# Patient Record
Sex: Male | Born: 1935 | Race: White | Hispanic: No | Smoking: Former smoker
Health system: Southern US, Community
[De-identification: ages and names within clinical notes are randomized; demographics above are authoritative.]

## PROBLEM LIST (undated history)

## (undated) DIAGNOSIS — I10 Essential (primary) hypertension: Secondary | ICD-10-CM

## (undated) DIAGNOSIS — R011 Cardiac murmur, unspecified: Secondary | ICD-10-CM

## (undated) DIAGNOSIS — E119 Type 2 diabetes mellitus without complications: Secondary | ICD-10-CM

## (undated) DIAGNOSIS — N189 Chronic kidney disease, unspecified: Secondary | ICD-10-CM

## (undated) DIAGNOSIS — C801 Malignant (primary) neoplasm, unspecified: Secondary | ICD-10-CM

## (undated) DIAGNOSIS — D649 Anemia, unspecified: Secondary | ICD-10-CM

## (undated) HISTORY — PX: EYE SURGERY: SHX253

## (undated) HISTORY — PX: ROTATOR CUFF REPAIR: SHX139

## (undated) HISTORY — DX: Chronic kidney disease, unspecified: N18.9

## (undated) HISTORY — PX: COLON SURGERY: SHX602

## (undated) HISTORY — DX: Type 2 diabetes mellitus without complications: E11.9

## (undated) HISTORY — DX: Essential (primary) hypertension: I10

---

## 2004-05-18 ENCOUNTER — Ambulatory Visit: Payer: Self-pay

## 2004-05-31 ENCOUNTER — Ambulatory Visit: Payer: Self-pay | Admitting: General Surgery

## 2004-06-04 ENCOUNTER — Inpatient Hospital Stay: Payer: Self-pay | Admitting: General Surgery

## 2004-06-18 ENCOUNTER — Ambulatory Visit: Payer: Self-pay | Admitting: Oncology

## 2004-06-25 ENCOUNTER — Ambulatory Visit: Payer: Self-pay | Admitting: General Surgery

## 2004-07-11 ENCOUNTER — Ambulatory Visit: Payer: Self-pay | Admitting: Oncology

## 2004-08-11 ENCOUNTER — Ambulatory Visit: Payer: Self-pay | Admitting: Oncology

## 2004-09-10 ENCOUNTER — Ambulatory Visit: Payer: Self-pay | Admitting: Oncology

## 2004-10-11 ENCOUNTER — Ambulatory Visit: Payer: Self-pay | Admitting: Oncology

## 2004-11-10 ENCOUNTER — Ambulatory Visit: Payer: Self-pay | Admitting: Oncology

## 2004-12-11 ENCOUNTER — Ambulatory Visit: Payer: Self-pay | Admitting: Oncology

## 2005-01-11 ENCOUNTER — Ambulatory Visit: Payer: Self-pay | Admitting: Oncology

## 2005-02-10 ENCOUNTER — Ambulatory Visit: Payer: Self-pay | Admitting: Oncology

## 2005-03-13 ENCOUNTER — Ambulatory Visit: Payer: Self-pay | Admitting: Oncology

## 2005-04-12 ENCOUNTER — Ambulatory Visit: Payer: Self-pay | Admitting: Oncology

## 2005-05-13 ENCOUNTER — Ambulatory Visit: Payer: Self-pay | Admitting: Oncology

## 2005-06-13 ENCOUNTER — Ambulatory Visit: Payer: Self-pay | Admitting: Oncology

## 2005-07-11 ENCOUNTER — Ambulatory Visit: Payer: Self-pay | Admitting: Oncology

## 2005-08-11 ENCOUNTER — Ambulatory Visit: Payer: Self-pay | Admitting: Oncology

## 2005-08-27 ENCOUNTER — Ambulatory Visit: Payer: Self-pay | Admitting: Gastroenterology

## 2005-09-17 ENCOUNTER — Ambulatory Visit: Payer: Self-pay | Admitting: Oncology

## 2005-10-10 ENCOUNTER — Ambulatory Visit: Payer: Self-pay | Admitting: Gastroenterology

## 2005-10-21 ENCOUNTER — Ambulatory Visit: Payer: Self-pay | Admitting: General Surgery

## 2005-10-29 ENCOUNTER — Ambulatory Visit: Payer: Self-pay | Admitting: Oncology

## 2005-11-10 ENCOUNTER — Ambulatory Visit: Payer: Self-pay | Admitting: Oncology

## 2006-01-21 ENCOUNTER — Ambulatory Visit: Payer: Self-pay | Admitting: Oncology

## 2006-02-10 ENCOUNTER — Ambulatory Visit: Payer: Self-pay | Admitting: Oncology

## 2006-05-13 DIAGNOSIS — C801 Malignant (primary) neoplasm, unspecified: Secondary | ICD-10-CM

## 2006-05-13 HISTORY — DX: Malignant (primary) neoplasm, unspecified: C80.1

## 2006-05-21 ENCOUNTER — Ambulatory Visit: Payer: Self-pay | Admitting: Oncology

## 2006-06-13 ENCOUNTER — Ambulatory Visit: Payer: Self-pay | Admitting: Oncology

## 2006-11-11 ENCOUNTER — Ambulatory Visit: Payer: Self-pay | Admitting: Oncology

## 2006-11-19 ENCOUNTER — Ambulatory Visit: Payer: Self-pay | Admitting: Oncology

## 2006-12-12 ENCOUNTER — Ambulatory Visit: Payer: Self-pay | Admitting: Oncology

## 2006-12-12 ENCOUNTER — Ambulatory Visit: Payer: Self-pay | Admitting: Gastroenterology

## 2007-05-14 ENCOUNTER — Ambulatory Visit: Payer: Self-pay | Admitting: Oncology

## 2007-05-21 ENCOUNTER — Ambulatory Visit: Payer: Self-pay | Admitting: Oncology

## 2007-06-14 ENCOUNTER — Ambulatory Visit: Payer: Self-pay | Admitting: Oncology

## 2007-11-25 ENCOUNTER — Ambulatory Visit: Payer: Self-pay | Admitting: Oncology

## 2007-12-12 ENCOUNTER — Ambulatory Visit: Payer: Self-pay | Admitting: Oncology

## 2008-05-13 ENCOUNTER — Ambulatory Visit: Payer: Self-pay | Admitting: Oncology

## 2008-05-25 ENCOUNTER — Ambulatory Visit: Payer: Self-pay | Admitting: Oncology

## 2008-06-13 ENCOUNTER — Ambulatory Visit: Payer: Self-pay | Admitting: Oncology

## 2008-10-07 ENCOUNTER — Ambulatory Visit: Payer: Self-pay | Admitting: Gastroenterology

## 2008-11-10 ENCOUNTER — Ambulatory Visit: Payer: Self-pay | Admitting: Oncology

## 2008-11-21 ENCOUNTER — Ambulatory Visit: Payer: Self-pay | Admitting: Oncology

## 2008-12-11 ENCOUNTER — Ambulatory Visit: Payer: Self-pay | Admitting: Oncology

## 2009-05-13 ENCOUNTER — Ambulatory Visit: Payer: Self-pay | Admitting: Oncology

## 2009-06-07 ENCOUNTER — Ambulatory Visit: Payer: Self-pay | Admitting: Oncology

## 2009-06-13 ENCOUNTER — Ambulatory Visit: Payer: Self-pay | Admitting: Oncology

## 2010-06-07 ENCOUNTER — Ambulatory Visit: Payer: Self-pay | Admitting: Oncology

## 2010-06-13 ENCOUNTER — Ambulatory Visit: Payer: Self-pay | Admitting: Oncology

## 2011-01-15 ENCOUNTER — Ambulatory Visit: Payer: Self-pay | Admitting: Gastroenterology

## 2011-01-16 ENCOUNTER — Ambulatory Visit: Payer: Self-pay | Admitting: Gastroenterology

## 2011-06-19 ENCOUNTER — Ambulatory Visit: Payer: Self-pay | Admitting: Oncology

## 2011-06-19 LAB — CBC CANCER CENTER
Basophil %: 0.4 %
Eosinophil #: 0.2 x10 3/mm (ref 0.0–0.7)
Eosinophil %: 2.2 %
HCT: 38.6 % — ABNORMAL LOW (ref 40.0–52.0)
Lymphocyte #: 1.4 x10 3/mm (ref 1.0–3.6)
MCH: 32.8 pg (ref 26.0–34.0)
MCHC: 34.7 g/dL (ref 32.0–36.0)
Monocyte #: 0.6 x10 3/mm (ref 0.0–0.7)
Monocyte %: 8 %
Neutrophil #: 5.2 x10 3/mm (ref 1.4–6.5)
Neutrophil %: 70.1 %
Platelet: 181 x10 3/mm (ref 150–440)
RBC: 4.09 10*6/uL — ABNORMAL LOW (ref 4.40–5.90)
RDW: 13.4 % (ref 11.5–14.5)

## 2011-06-19 LAB — COMPREHENSIVE METABOLIC PANEL
Albumin: 3.8 g/dL (ref 3.4–5.0)
Anion Gap: 7 (ref 7–16)
Bilirubin,Total: 0.6 mg/dL (ref 0.2–1.0)
Calcium, Total: 8.9 mg/dL (ref 8.5–10.1)
EGFR (African American): 33 — ABNORMAL LOW
EGFR (Non-African Amer.): 28 — ABNORMAL LOW
Glucose: 193 mg/dL — ABNORMAL HIGH (ref 65–99)
Potassium: 4.1 mmol/L (ref 3.5–5.1)
SGOT(AST): 24 U/L (ref 15–37)

## 2011-07-12 ENCOUNTER — Ambulatory Visit: Payer: Self-pay | Admitting: Oncology

## 2013-12-01 DIAGNOSIS — M5030 Other cervical disc degeneration, unspecified cervical region: Secondary | ICD-10-CM | POA: Insufficient documentation

## 2013-12-01 DIAGNOSIS — M47812 Spondylosis without myelopathy or radiculopathy, cervical region: Secondary | ICD-10-CM | POA: Insufficient documentation

## 2014-03-18 DIAGNOSIS — Z85038 Personal history of other malignant neoplasm of large intestine: Secondary | ICD-10-CM | POA: Insufficient documentation

## 2014-04-05 ENCOUNTER — Ambulatory Visit: Payer: Self-pay | Admitting: Gastroenterology

## 2015-05-19 DIAGNOSIS — J209 Acute bronchitis, unspecified: Secondary | ICD-10-CM | POA: Diagnosis not present

## 2015-06-02 DIAGNOSIS — I10 Essential (primary) hypertension: Secondary | ICD-10-CM | POA: Diagnosis not present

## 2015-06-02 DIAGNOSIS — E7800 Pure hypercholesterolemia, unspecified: Secondary | ICD-10-CM | POA: Diagnosis not present

## 2015-06-02 DIAGNOSIS — Z79899 Other long term (current) drug therapy: Secondary | ICD-10-CM | POA: Diagnosis not present

## 2015-06-02 DIAGNOSIS — N184 Chronic kidney disease, stage 4 (severe): Secondary | ICD-10-CM | POA: Diagnosis not present

## 2015-06-02 DIAGNOSIS — E118 Type 2 diabetes mellitus with unspecified complications: Secondary | ICD-10-CM | POA: Diagnosis not present

## 2015-06-02 DIAGNOSIS — N529 Male erectile dysfunction, unspecified: Secondary | ICD-10-CM | POA: Diagnosis not present

## 2015-06-02 DIAGNOSIS — Z125 Encounter for screening for malignant neoplasm of prostate: Secondary | ICD-10-CM | POA: Diagnosis not present

## 2015-06-06 DIAGNOSIS — N184 Chronic kidney disease, stage 4 (severe): Secondary | ICD-10-CM | POA: Diagnosis not present

## 2015-06-06 DIAGNOSIS — Z794 Long term (current) use of insulin: Secondary | ICD-10-CM | POA: Diagnosis not present

## 2015-06-06 DIAGNOSIS — E1165 Type 2 diabetes mellitus with hyperglycemia: Secondary | ICD-10-CM | POA: Diagnosis not present

## 2015-06-06 DIAGNOSIS — E1122 Type 2 diabetes mellitus with diabetic chronic kidney disease: Secondary | ICD-10-CM | POA: Diagnosis not present

## 2015-06-27 DIAGNOSIS — E119 Type 2 diabetes mellitus without complications: Secondary | ICD-10-CM | POA: Diagnosis not present

## 2015-06-27 DIAGNOSIS — H35343 Macular cyst, hole, or pseudohole, bilateral: Secondary | ICD-10-CM | POA: Diagnosis not present

## 2015-07-14 DIAGNOSIS — Z79899 Other long term (current) drug therapy: Secondary | ICD-10-CM | POA: Diagnosis not present

## 2015-07-14 DIAGNOSIS — Z125 Encounter for screening for malignant neoplasm of prostate: Secondary | ICD-10-CM | POA: Diagnosis not present

## 2015-07-14 DIAGNOSIS — E7800 Pure hypercholesterolemia, unspecified: Secondary | ICD-10-CM | POA: Diagnosis not present

## 2015-07-14 DIAGNOSIS — E118 Type 2 diabetes mellitus with unspecified complications: Secondary | ICD-10-CM | POA: Diagnosis not present

## 2015-07-14 DIAGNOSIS — I10 Essential (primary) hypertension: Secondary | ICD-10-CM | POA: Diagnosis not present

## 2015-08-01 DIAGNOSIS — I10 Essential (primary) hypertension: Secondary | ICD-10-CM | POA: Diagnosis not present

## 2015-08-01 DIAGNOSIS — N184 Chronic kidney disease, stage 4 (severe): Secondary | ICD-10-CM | POA: Diagnosis not present

## 2015-08-01 DIAGNOSIS — Z794 Long term (current) use of insulin: Secondary | ICD-10-CM | POA: Diagnosis not present

## 2015-08-01 DIAGNOSIS — E118 Type 2 diabetes mellitus with unspecified complications: Secondary | ICD-10-CM | POA: Diagnosis not present

## 2015-08-01 DIAGNOSIS — E1122 Type 2 diabetes mellitus with diabetic chronic kidney disease: Secondary | ICD-10-CM | POA: Diagnosis not present

## 2015-08-31 ENCOUNTER — Other Ambulatory Visit: Payer: Self-pay | Admitting: Internal Medicine

## 2015-08-31 DIAGNOSIS — I10 Essential (primary) hypertension: Secondary | ICD-10-CM | POA: Diagnosis not present

## 2015-08-31 DIAGNOSIS — Z8739 Personal history of other diseases of the musculoskeletal system and connective tissue: Secondary | ICD-10-CM | POA: Diagnosis not present

## 2015-08-31 DIAGNOSIS — Z79899 Other long term (current) drug therapy: Secondary | ICD-10-CM | POA: Diagnosis not present

## 2015-08-31 DIAGNOSIS — E118 Type 2 diabetes mellitus with unspecified complications: Secondary | ICD-10-CM | POA: Diagnosis not present

## 2015-08-31 DIAGNOSIS — E7800 Pure hypercholesterolemia, unspecified: Secondary | ICD-10-CM | POA: Diagnosis not present

## 2015-08-31 DIAGNOSIS — N184 Chronic kidney disease, stage 4 (severe): Secondary | ICD-10-CM

## 2015-08-31 DIAGNOSIS — N185 Chronic kidney disease, stage 5: Secondary | ICD-10-CM | POA: Insufficient documentation

## 2015-09-06 DIAGNOSIS — E7800 Pure hypercholesterolemia, unspecified: Secondary | ICD-10-CM | POA: Diagnosis not present

## 2015-09-06 DIAGNOSIS — I10 Essential (primary) hypertension: Secondary | ICD-10-CM | POA: Diagnosis not present

## 2015-09-06 DIAGNOSIS — E118 Type 2 diabetes mellitus with unspecified complications: Secondary | ICD-10-CM | POA: Diagnosis not present

## 2015-09-06 DIAGNOSIS — N184 Chronic kidney disease, stage 4 (severe): Secondary | ICD-10-CM | POA: Diagnosis not present

## 2015-09-07 ENCOUNTER — Ambulatory Visit
Admission: RE | Admit: 2015-09-07 | Discharge: 2015-09-07 | Disposition: A | Discharge status: Home / Self Care | Payer: PPO | Source: Ambulatory Visit | Attending: Internal Medicine | Admitting: Internal Medicine

## 2015-09-07 DIAGNOSIS — N184 Chronic kidney disease, stage 4 (severe): Secondary | ICD-10-CM | POA: Insufficient documentation

## 2015-09-11 DIAGNOSIS — E1122 Type 2 diabetes mellitus with diabetic chronic kidney disease: Secondary | ICD-10-CM | POA: Diagnosis not present

## 2015-09-11 DIAGNOSIS — R809 Proteinuria, unspecified: Secondary | ICD-10-CM | POA: Diagnosis not present

## 2015-09-11 DIAGNOSIS — I701 Atherosclerosis of renal artery: Secondary | ICD-10-CM | POA: Diagnosis not present

## 2015-09-11 DIAGNOSIS — N183 Chronic kidney disease, stage 3 (moderate): Secondary | ICD-10-CM | POA: Diagnosis not present

## 2015-09-11 DIAGNOSIS — I129 Hypertensive chronic kidney disease with stage 1 through stage 4 chronic kidney disease, or unspecified chronic kidney disease: Secondary | ICD-10-CM | POA: Diagnosis not present

## 2015-09-15 DIAGNOSIS — I701 Atherosclerosis of renal artery: Secondary | ICD-10-CM | POA: Diagnosis not present

## 2015-10-16 DIAGNOSIS — N179 Acute kidney failure, unspecified: Secondary | ICD-10-CM | POA: Diagnosis not present

## 2015-10-16 DIAGNOSIS — I701 Atherosclerosis of renal artery: Secondary | ICD-10-CM | POA: Diagnosis not present

## 2015-10-16 DIAGNOSIS — I129 Hypertensive chronic kidney disease with stage 1 through stage 4 chronic kidney disease, or unspecified chronic kidney disease: Secondary | ICD-10-CM | POA: Diagnosis not present

## 2015-10-16 DIAGNOSIS — E1122 Type 2 diabetes mellitus with diabetic chronic kidney disease: Secondary | ICD-10-CM | POA: Diagnosis not present

## 2015-10-16 DIAGNOSIS — N184 Chronic kidney disease, stage 4 (severe): Secondary | ICD-10-CM | POA: Diagnosis not present

## 2015-10-26 DIAGNOSIS — I129 Hypertensive chronic kidney disease with stage 1 through stage 4 chronic kidney disease, or unspecified chronic kidney disease: Secondary | ICD-10-CM | POA: Diagnosis not present

## 2015-10-26 DIAGNOSIS — N184 Chronic kidney disease, stage 4 (severe): Secondary | ICD-10-CM | POA: Diagnosis not present

## 2015-10-26 DIAGNOSIS — I701 Atherosclerosis of renal artery: Secondary | ICD-10-CM | POA: Diagnosis not present

## 2015-10-26 DIAGNOSIS — E119 Type 2 diabetes mellitus without complications: Secondary | ICD-10-CM | POA: Diagnosis not present

## 2015-11-06 DIAGNOSIS — E1122 Type 2 diabetes mellitus with diabetic chronic kidney disease: Secondary | ICD-10-CM | POA: Diagnosis not present

## 2015-11-06 DIAGNOSIS — Z794 Long term (current) use of insulin: Secondary | ICD-10-CM | POA: Diagnosis not present

## 2015-11-06 DIAGNOSIS — N184 Chronic kidney disease, stage 4 (severe): Secondary | ICD-10-CM | POA: Diagnosis not present

## 2015-11-23 DIAGNOSIS — E782 Mixed hyperlipidemia: Secondary | ICD-10-CM | POA: Diagnosis not present

## 2015-11-23 DIAGNOSIS — E118 Type 2 diabetes mellitus with unspecified complications: Secondary | ICD-10-CM | POA: Diagnosis not present

## 2015-11-23 DIAGNOSIS — Z79899 Other long term (current) drug therapy: Secondary | ICD-10-CM | POA: Diagnosis not present

## 2015-11-23 DIAGNOSIS — I10 Essential (primary) hypertension: Secondary | ICD-10-CM | POA: Diagnosis not present

## 2015-11-23 DIAGNOSIS — Z125 Encounter for screening for malignant neoplasm of prostate: Secondary | ICD-10-CM | POA: Diagnosis not present

## 2015-11-30 DIAGNOSIS — E7800 Pure hypercholesterolemia, unspecified: Secondary | ICD-10-CM | POA: Diagnosis not present

## 2015-11-30 DIAGNOSIS — I10 Essential (primary) hypertension: Secondary | ICD-10-CM | POA: Diagnosis not present

## 2015-11-30 DIAGNOSIS — N184 Chronic kidney disease, stage 4 (severe): Secondary | ICD-10-CM | POA: Diagnosis not present

## 2015-11-30 DIAGNOSIS — E118 Type 2 diabetes mellitus with unspecified complications: Secondary | ICD-10-CM | POA: Diagnosis not present

## 2015-12-05 DIAGNOSIS — E1122 Type 2 diabetes mellitus with diabetic chronic kidney disease: Secondary | ICD-10-CM | POA: Diagnosis not present

## 2015-12-05 DIAGNOSIS — N184 Chronic kidney disease, stage 4 (severe): Secondary | ICD-10-CM | POA: Diagnosis not present

## 2015-12-05 DIAGNOSIS — I701 Atherosclerosis of renal artery: Secondary | ICD-10-CM | POA: Diagnosis not present

## 2015-12-05 DIAGNOSIS — R809 Proteinuria, unspecified: Secondary | ICD-10-CM | POA: Diagnosis not present

## 2015-12-05 DIAGNOSIS — I129 Hypertensive chronic kidney disease with stage 1 through stage 4 chronic kidney disease, or unspecified chronic kidney disease: Secondary | ICD-10-CM | POA: Diagnosis not present

## 2016-02-05 DIAGNOSIS — R350 Frequency of micturition: Secondary | ICD-10-CM | POA: Diagnosis not present

## 2016-02-05 DIAGNOSIS — R590 Localized enlarged lymph nodes: Secondary | ICD-10-CM | POA: Diagnosis not present

## 2016-02-22 DIAGNOSIS — M545 Low back pain: Secondary | ICD-10-CM | POA: Diagnosis not present

## 2016-02-22 DIAGNOSIS — N184 Chronic kidney disease, stage 4 (severe): Secondary | ICD-10-CM | POA: Diagnosis not present

## 2016-03-11 DIAGNOSIS — E7800 Pure hypercholesterolemia, unspecified: Secondary | ICD-10-CM | POA: Diagnosis not present

## 2016-03-11 DIAGNOSIS — E118 Type 2 diabetes mellitus with unspecified complications: Secondary | ICD-10-CM | POA: Diagnosis not present

## 2016-03-11 DIAGNOSIS — E1122 Type 2 diabetes mellitus with diabetic chronic kidney disease: Secondary | ICD-10-CM | POA: Diagnosis not present

## 2016-03-11 DIAGNOSIS — I10 Essential (primary) hypertension: Secondary | ICD-10-CM | POA: Diagnosis not present

## 2016-03-11 DIAGNOSIS — N184 Chronic kidney disease, stage 4 (severe): Secondary | ICD-10-CM | POA: Diagnosis not present

## 2016-03-11 DIAGNOSIS — Z794 Long term (current) use of insulin: Secondary | ICD-10-CM | POA: Diagnosis not present

## 2016-03-12 DIAGNOSIS — H35372 Puckering of macula, left eye: Secondary | ICD-10-CM | POA: Diagnosis not present

## 2016-03-13 DIAGNOSIS — I10 Essential (primary) hypertension: Secondary | ICD-10-CM | POA: Diagnosis not present

## 2016-03-13 DIAGNOSIS — N184 Chronic kidney disease, stage 4 (severe): Secondary | ICD-10-CM | POA: Diagnosis not present

## 2016-03-13 DIAGNOSIS — E7800 Pure hypercholesterolemia, unspecified: Secondary | ICD-10-CM | POA: Diagnosis not present

## 2016-03-13 DIAGNOSIS — I129 Hypertensive chronic kidney disease with stage 1 through stage 4 chronic kidney disease, or unspecified chronic kidney disease: Secondary | ICD-10-CM | POA: Diagnosis not present

## 2016-03-18 DIAGNOSIS — I701 Atherosclerosis of renal artery: Secondary | ICD-10-CM | POA: Diagnosis not present

## 2016-03-18 DIAGNOSIS — R809 Proteinuria, unspecified: Secondary | ICD-10-CM | POA: Diagnosis not present

## 2016-03-18 DIAGNOSIS — N184 Chronic kidney disease, stage 4 (severe): Secondary | ICD-10-CM | POA: Diagnosis not present

## 2016-03-18 DIAGNOSIS — I129 Hypertensive chronic kidney disease with stage 1 through stage 4 chronic kidney disease, or unspecified chronic kidney disease: Secondary | ICD-10-CM | POA: Diagnosis not present

## 2016-03-18 DIAGNOSIS — E1122 Type 2 diabetes mellitus with diabetic chronic kidney disease: Secondary | ICD-10-CM | POA: Diagnosis not present

## 2016-04-26 ENCOUNTER — Other Ambulatory Visit (INDEPENDENT_AMBULATORY_CARE_PROVIDER_SITE_OTHER): Payer: Self-pay | Admitting: Vascular Surgery

## 2016-04-26 DIAGNOSIS — M7711 Lateral epicondylitis, right elbow: Secondary | ICD-10-CM | POA: Diagnosis not present

## 2016-04-26 DIAGNOSIS — N189 Chronic kidney disease, unspecified: Secondary | ICD-10-CM

## 2016-05-02 ENCOUNTER — Ambulatory Visit (INDEPENDENT_AMBULATORY_CARE_PROVIDER_SITE_OTHER): Payer: PPO | Admitting: Vascular Surgery

## 2016-05-02 ENCOUNTER — Encounter (INDEPENDENT_AMBULATORY_CARE_PROVIDER_SITE_OTHER): Payer: Self-pay | Admitting: Vascular Surgery

## 2016-05-02 ENCOUNTER — Ambulatory Visit (INDEPENDENT_AMBULATORY_CARE_PROVIDER_SITE_OTHER): Payer: PPO

## 2016-05-02 DIAGNOSIS — E119 Type 2 diabetes mellitus without complications: Secondary | ICD-10-CM

## 2016-05-02 DIAGNOSIS — N189 Chronic kidney disease, unspecified: Secondary | ICD-10-CM

## 2016-05-02 DIAGNOSIS — I10 Essential (primary) hypertension: Secondary | ICD-10-CM | POA: Diagnosis not present

## 2016-05-02 DIAGNOSIS — E118 Type 2 diabetes mellitus with unspecified complications: Secondary | ICD-10-CM | POA: Insufficient documentation

## 2016-05-02 DIAGNOSIS — E1122 Type 2 diabetes mellitus with diabetic chronic kidney disease: Secondary | ICD-10-CM | POA: Diagnosis not present

## 2016-05-02 DIAGNOSIS — E785 Hyperlipidemia, unspecified: Secondary | ICD-10-CM | POA: Insufficient documentation

## 2016-05-02 DIAGNOSIS — Z794 Long term (current) use of insulin: Secondary | ICD-10-CM | POA: Diagnosis not present

## 2016-05-02 DIAGNOSIS — I701 Atherosclerosis of renal artery: Secondary | ICD-10-CM | POA: Insufficient documentation

## 2016-05-02 DIAGNOSIS — N184 Chronic kidney disease, stage 4 (severe): Secondary | ICD-10-CM

## 2016-05-02 DIAGNOSIS — E782 Mixed hyperlipidemia: Secondary | ICD-10-CM

## 2016-05-02 NOTE — Progress Notes (Signed)
MRN : [id number]  Dylan Sanders is a 82 y.o. (04-20-1932) male who presents with chief complaint of  Chief Complaint  Patient presents with  . Follow-up  .  History of Present Illness: The patient is seen for follow up of renal artery stenosis. His hypertension which has been under control. The patient has a long history of hypertension.  The patient does have family history of hypertension.   There is no prior documented abdominal bruit. The patient occasionally has flushing symptoms but denies palpitations. No episodes of syncope.There is no history of headache. There is no history of flash pulmonary edema.  The patient denies a history of renal disease.  The patient denies amaurosis fugax or recent TIA symptoms. There are no recent neurological changes noted. The patient denies claudication symptoms or rest pain symptoms. The patient denies history of DVT, PE or superficial thrombophlebitis. The patient denies recent episodes of angina or shortness of breath.   Duplex ultrasound of the renal arteries demonstrates stable noncritical RAS   Current Meds  Medication Sig  . allopurinol (ZYLOPRIM) 100 MG tablet Take by mouth.  Marland Kitchen aspirin EC 81 MG tablet Take by mouth.  . Blood Glucose Monitoring Suppl (ONE TOUCH ULTRA 2) w/Device KIT USE TO CHECK SUGARS DAILY  . furosemide (LASIX) 20 MG tablet   . glipiZIDE (GLUCOTROL XL) 5 MG 24 hr tablet   . LANTUS SOLOSTAR 100 UNIT/ML Solostar Pen   . losartan (COZAAR) 100 MG tablet Take by mouth.  . simvastatin (ZOCOR) 20 MG tablet Take by mouth.  Marland Kitchen ULTICARE SHORT PEN NEEDLES 31G X 8 MM MISC   . verapamil (CALAN-SR) 240 MG CR tablet Take 1 tablet by mouth once daily    Past Medical History:  Diagnosis Date  . Chronic kidney disease   . Diabetes mellitus without complication ([place])   . Hypertension     Past Surgical History:  Procedure Laterality Date  . COLON SURGERY    . ROTATOR CUFF REPAIR Right     Social History Social  History  Substance Use Topics  . Smoking status: Former Research scientist (life sciences)  . Smokeless tobacco: Never Used  . Alcohol use Yes     Comment: ocassionally    Family History Family History  Problem Relation Age of Onset  . Cancer Father   No family history of bleeding/clotting disorders, porphyria or autoimmune disease   No Known Allergies   REVIEW OF SYSTEMS (Negative unless checked)  Constitutional: [] Weight loss  [] Fever  [] Chills Cardiac: [] Chest pain   [] Chest pressure   [] Palpitations   [] Shortness of breath when laying flat   [] Shortness of breath with exertion. Vascular:  [] Pain in legs with walking   [] Pain in legs at rest  [] History of DVT   [] Phlebitis   [] Swelling in legs   [] Varicose veins   [] Non-healing ulcers Pulmonary:   [] Uses home oxygen   [] Productive cough   [] Hemoptysis   [] Wheeze  [] COPD   [] Asthma Neurologic:  [] Dizziness   [] Seizures   [] History of stroke   [] History of TIA  [] Aphasia   [] Vissual changes   [] Weakness or numbness in arm   [] Weakness or numbness in leg Musculoskeletal:   [] Joint swelling   [] Joint pain   [] Low back pain Hematologic:  [] Easy bruising  [] Easy bleeding   [] Hypercoagulable state   [] Anemic Gastrointestinal:  [] Diarrhea   [] Vomiting  [] Gastroesophageal reflux/heartburn   [] Difficulty swallowing. Genitourinary:  [] Chronic kidney disease   [] Difficult urination  [] Frequent urination   []   Blood in urine Skin:  [] Rashes   [] Ulcers  Psychological:  [] History of anxiety   []  History of major depression.  Physical Examination  Vitals:   05/02/16 1007  BP: (!) 185/73  Pulse: 63  Resp: 17  Weight: 135 lb (61.2 kg)  Height: 5' 8"  (1.727 m)   Body mass index is 20.53 kg/m. Gen: WD/WN, NAD Head: [place]/AT, No temporalis wasting.  Ear/Nose/Throat: Hearing grossly intact, nares w/o erythema or drainage, poor dentition Eyes: PER, EOMI, sclera nonicteric.  Neck: Supple, no masses.  No bruit or JVD.  Pulmonary:  Good air movement, clear to auscultation  bilaterally, no use of accessory muscles.  Cardiac: RRR, normal S1, S2, no Murmurs. Vascular:  Vessel Right Left  Radial Palpable Palpable  Ulnar Palpable Palpable  Brachial Palpable Palpable  Carotid Palpable Palpable  Femoral Palpable Palpable  Popliteal Palpable Palpable  PT Palpable Palpable  DP Palpable Palpable   Gastrointestinal: soft, non-distended. No guarding/no peritoneal signs.  Musculoskeletal: M/S 5/5 throughout.  No deformity or atrophy.  Neurologic: CN 2-12 intact. Pain and light touch intact in extremities.  Symmetrical.  Speech is fluent. Motor exam as listed above. Psychiatric: Judgment intact, Mood & affect appropriate for pt's clinical situation. Dermatologic: No rashes or ulcers noted.  No changes consistent with cellulitis. Lymph : No Cervical lymphadenopathy, no lichenification or skin changes of chronic lymphedema.  CBC Lab Results  Component Value Date   WBC 7.4 06/19/2011   HGB 13.4 06/19/2011   HCT 38.6 (L) 06/19/2011   MCV 94 06/19/2011   PLT 181 06/19/2011    BMET    Component Value Date/Time   NA 141 06/19/2011 1343   K 4.1 06/19/2011 1343   CL 102 06/19/2011 1343   CO2 32 06/19/2011 1343   GLUCOSE 193 (H) 06/19/2011 1343   BUN 26 (H) 06/19/2011 1343   CREATININE 2.43 (H) 06/19/2011 1343   CALCIUM 8.9 06/19/2011 1343   GFRNONAA 28 (L) 06/19/2011 1343   GFRAA 33 (L) 06/19/2011 1343   CrCl cannot be calculated (Patient's most recent lab result is older than the maximum 21 days allowed.).  COAG No results found for: INR, PROTIME  Radiology No results found.  Assessment/Plan 1. Renal artery stenosis (HCC) Given patient's arterial disease optimal control of the patient's hypertension is important. BP is acceptable today  The patient's vital signs and noninvasive studies support the renal artery stenosis is not significantly increased when compared to the previous study.  No invasive studies or intervention is indicated at this  time.  The patient will continue the current antihypertensive medications, no changes at this time.  The primary medical service will continue aggressive antihypertensive therapy as per the AHA guidelines   - VAS US RENAL ARTERY DUPLEX; Future  2.Chronic renal impairment, stage 4 (severe) ([place]) See #1  3. Essential hypertension Continue antihypertensive medications as already ordered, these medications have been reviewed and there are no changes at this time.  4. Type 2 diabetes mellitus with stage 4 chronic kidney disease, with long-term current use of insulin (HCC) Continue hypoglycemic medications as already ordered, these medications have been reviewed and there are no changes at this time.  Hgb A1C to be monitored as already arranged by primary service  5. Mixed hyperlipidemia Continue statin as ordered and reviewed, no changes at this time   Hortencia Pilar, MD  05/02/2016 10:29 AM

## 2016-06-03 DIAGNOSIS — S46002A Unspecified injury of muscle(s) and tendon(s) of the rotator cuff of left shoulder, initial encounter: Secondary | ICD-10-CM | POA: Diagnosis not present

## 2016-06-03 DIAGNOSIS — M25312 Other instability, left shoulder: Secondary | ICD-10-CM | POA: Diagnosis not present

## 2016-06-03 DIAGNOSIS — M25512 Pain in left shoulder: Secondary | ICD-10-CM | POA: Diagnosis not present

## 2016-06-04 ENCOUNTER — Other Ambulatory Visit: Payer: Self-pay | Admitting: Orthopedic Surgery

## 2016-06-04 DIAGNOSIS — M25512 Pain in left shoulder: Secondary | ICD-10-CM

## 2016-06-12 DIAGNOSIS — I10 Essential (primary) hypertension: Secondary | ICD-10-CM | POA: Diagnosis not present

## 2016-06-12 DIAGNOSIS — Z79899 Other long term (current) drug therapy: Secondary | ICD-10-CM | POA: Diagnosis not present

## 2016-06-12 DIAGNOSIS — Z0000 Encounter for general adult medical examination without abnormal findings: Secondary | ICD-10-CM | POA: Diagnosis not present

## 2016-06-12 DIAGNOSIS — E7800 Pure hypercholesterolemia, unspecified: Secondary | ICD-10-CM | POA: Diagnosis not present

## 2016-06-12 DIAGNOSIS — N184 Chronic kidney disease, stage 4 (severe): Secondary | ICD-10-CM | POA: Diagnosis not present

## 2016-06-12 DIAGNOSIS — E118 Type 2 diabetes mellitus with unspecified complications: Secondary | ICD-10-CM | POA: Diagnosis not present

## 2016-06-12 DIAGNOSIS — E215 Disorder of parathyroid gland, unspecified: Secondary | ICD-10-CM | POA: Diagnosis not present

## 2016-06-13 ENCOUNTER — Ambulatory Visit
Admission: RE | Admit: 2016-06-13 | Discharge: 2016-06-13 | Disposition: A | Discharge status: Home / Self Care | Payer: PPO | Source: Ambulatory Visit | Attending: Orthopedic Surgery | Admitting: Orthopedic Surgery

## 2016-06-26 ENCOUNTER — Ambulatory Visit
Admission: RE | Admit: 2016-06-26 | Discharge: 2016-06-26 | Disposition: A | Discharge status: Home / Self Care | Payer: PPO | Source: Ambulatory Visit | Attending: Orthopedic Surgery | Admitting: Orthopedic Surgery

## 2016-06-26 DIAGNOSIS — S46812A Strain of other muscles, fascia and tendons at shoulder and upper arm level, left arm, initial encounter: Secondary | ICD-10-CM | POA: Diagnosis not present

## 2016-06-26 DIAGNOSIS — M25512 Pain in left shoulder: Secondary | ICD-10-CM | POA: Diagnosis not present

## 2016-06-26 DIAGNOSIS — X58XXXA Exposure to other specified factors, initial encounter: Secondary | ICD-10-CM | POA: Diagnosis not present

## 2016-06-26 DIAGNOSIS — M12812 Other specific arthropathies, not elsewhere classified, left shoulder: Secondary | ICD-10-CM | POA: Diagnosis not present

## 2016-06-26 DIAGNOSIS — M19012 Primary osteoarthritis, left shoulder: Secondary | ICD-10-CM | POA: Diagnosis not present

## 2016-07-03 DIAGNOSIS — Z794 Long term (current) use of insulin: Secondary | ICD-10-CM | POA: Diagnosis not present

## 2016-07-03 DIAGNOSIS — I10 Essential (primary) hypertension: Secondary | ICD-10-CM | POA: Diagnosis not present

## 2016-07-03 DIAGNOSIS — N184 Chronic kidney disease, stage 4 (severe): Secondary | ICD-10-CM | POA: Diagnosis not present

## 2016-07-03 DIAGNOSIS — Z79899 Other long term (current) drug therapy: Secondary | ICD-10-CM | POA: Diagnosis not present

## 2016-07-03 DIAGNOSIS — E7800 Pure hypercholesterolemia, unspecified: Secondary | ICD-10-CM | POA: Diagnosis not present

## 2016-07-03 DIAGNOSIS — E215 Disorder of parathyroid gland, unspecified: Secondary | ICD-10-CM | POA: Diagnosis not present

## 2016-07-03 DIAGNOSIS — E1122 Type 2 diabetes mellitus with diabetic chronic kidney disease: Secondary | ICD-10-CM | POA: Diagnosis not present

## 2016-07-05 DIAGNOSIS — M12812 Other specific arthropathies, not elsewhere classified, left shoulder: Secondary | ICD-10-CM | POA: Diagnosis not present

## 2016-07-05 DIAGNOSIS — M19012 Primary osteoarthritis, left shoulder: Secondary | ICD-10-CM | POA: Diagnosis not present

## 2016-07-05 DIAGNOSIS — M75122 Complete rotator cuff tear or rupture of left shoulder, not specified as traumatic: Secondary | ICD-10-CM | POA: Diagnosis not present

## 2016-07-10 DIAGNOSIS — Z794 Long term (current) use of insulin: Secondary | ICD-10-CM | POA: Diagnosis not present

## 2016-07-10 DIAGNOSIS — N184 Chronic kidney disease, stage 4 (severe): Secondary | ICD-10-CM | POA: Diagnosis not present

## 2016-07-10 DIAGNOSIS — E1122 Type 2 diabetes mellitus with diabetic chronic kidney disease: Secondary | ICD-10-CM | POA: Diagnosis not present

## 2016-07-10 DIAGNOSIS — I10 Essential (primary) hypertension: Secondary | ICD-10-CM | POA: Diagnosis not present

## 2016-07-31 DIAGNOSIS — E875 Hyperkalemia: Secondary | ICD-10-CM | POA: Diagnosis not present

## 2016-07-31 DIAGNOSIS — M109 Gout, unspecified: Secondary | ICD-10-CM | POA: Diagnosis not present

## 2016-07-31 DIAGNOSIS — E1122 Type 2 diabetes mellitus with diabetic chronic kidney disease: Secondary | ICD-10-CM | POA: Diagnosis not present

## 2016-07-31 DIAGNOSIS — N184 Chronic kidney disease, stage 4 (severe): Secondary | ICD-10-CM | POA: Diagnosis not present

## 2016-07-31 DIAGNOSIS — I401 Isolated myocarditis: Secondary | ICD-10-CM | POA: Diagnosis not present

## 2016-07-31 DIAGNOSIS — R809 Proteinuria, unspecified: Secondary | ICD-10-CM | POA: Diagnosis not present

## 2016-07-31 DIAGNOSIS — I129 Hypertensive chronic kidney disease with stage 1 through stage 4 chronic kidney disease, or unspecified chronic kidney disease: Secondary | ICD-10-CM | POA: Diagnosis not present

## 2016-07-31 DIAGNOSIS — R319 Hematuria, unspecified: Secondary | ICD-10-CM | POA: Diagnosis not present

## 2016-08-07 DIAGNOSIS — N2581 Secondary hyperparathyroidism of renal origin: Secondary | ICD-10-CM | POA: Diagnosis not present

## 2016-08-07 DIAGNOSIS — N184 Chronic kidney disease, stage 4 (severe): Secondary | ICD-10-CM | POA: Diagnosis not present

## 2016-08-07 DIAGNOSIS — I129 Hypertensive chronic kidney disease with stage 1 through stage 4 chronic kidney disease, or unspecified chronic kidney disease: Secondary | ICD-10-CM | POA: Diagnosis not present

## 2016-08-07 DIAGNOSIS — R809 Proteinuria, unspecified: Secondary | ICD-10-CM | POA: Diagnosis not present

## 2016-08-07 DIAGNOSIS — E1122 Type 2 diabetes mellitus with diabetic chronic kidney disease: Secondary | ICD-10-CM | POA: Diagnosis not present

## 2016-09-05 ENCOUNTER — Encounter
Admission: RE | Admit: 2016-09-05 | Discharge: 2016-09-05 | Disposition: A | Discharge status: Home / Self Care | Payer: PPO | Source: Ambulatory Visit | Attending: Surgery | Admitting: Surgery

## 2016-09-05 DIAGNOSIS — Z01812 Encounter for preprocedural laboratory examination: Secondary | ICD-10-CM | POA: Insufficient documentation

## 2016-09-05 HISTORY — DX: Malignant (primary) neoplasm, unspecified: C80.1

## 2016-09-05 LAB — URINALYSIS, ROUTINE W REFLEX MICROSCOPIC
BILIRUBIN URINE: NEGATIVE
Bacteria, UA: NONE SEEN
Glucose, UA: 50 mg/dL — AB
HGB URINE DIPSTICK: NEGATIVE
KETONES UR: NEGATIVE mg/dL
LEUKOCYTES UA: NEGATIVE
NITRITE: NEGATIVE
Protein, ur: 100 mg/dL — AB
SQUAMOUS EPITHELIAL / LPF: NONE SEEN
Specific Gravity, Urine: 1.016 (ref 1.005–1.030)
pH: 5 (ref 5.0–8.0)

## 2016-09-05 LAB — BASIC METABOLIC PANEL
Anion gap: 8 (ref 5–15)
BUN: 51 mg/dL — AB (ref 6–20)
CHLORIDE: 105 mmol/L (ref 101–111)
CO2: 24 mmol/L (ref 22–32)
CREATININE: 3.22 mg/dL — AB (ref 0.61–1.24)
Calcium: 8.7 mg/dL — ABNORMAL LOW (ref 8.9–10.3)
GFR calc non Af Amer: 17 mL/min — ABNORMAL LOW (ref >60)
GFR, EST AFRICAN AMERICAN: 19 mL/min — AB (ref >60)
Glucose, Bld: 169 mg/dL — ABNORMAL HIGH (ref 65–99)
POTASSIUM: 4.5 mmol/L (ref 3.5–5.1)
Sodium: 137 mmol/L (ref 135–145)

## 2016-09-05 LAB — SURGICAL PCR SCREEN
MRSA, PCR: NEGATIVE
STAPHYLOCOCCUS AUREUS: NEGATIVE

## 2016-09-05 LAB — CBC
HEMATOCRIT: 37.2 % — AB (ref 40.0–52.0)
HEMOGLOBIN: 12.5 g/dL — AB (ref 13.0–18.0)
MCH: 32.9 pg (ref 26.0–34.0)
MCHC: 33.7 g/dL (ref 32.0–36.0)
MCV: 97.7 fL (ref 80.0–100.0)
Platelets: 194 10*3/uL (ref 150–440)
RBC: 3.8 MIL/uL — ABNORMAL LOW (ref 4.40–5.90)
RDW: 14 % (ref 11.5–14.5)
WBC: 7 10*3/uL (ref 3.8–10.6)

## 2016-09-05 LAB — PROTIME-INR
INR: 1.08
Prothrombin Time: 14 seconds (ref 11.4–15.2)

## 2016-09-05 NOTE — Patient Instructions (Addendum)
  Your procedure is scheduled on: Thurs. 09/19/16 Report to Day Surgery. To find out your arrival time please call [phone] between 1PM - 3PM on Wed. 09/18/16.  Remember: Instructions that are not followed completely may result in serious medical risk, up to and including death, or upon the discretion of your surgeon and anesthesiologist your surgery may need to be rescheduled.    __x__ 1. Do not eat food or drink liquids after midnight. No gum chewing or hard candies.     __x__ 2. No Alcohol for 24 hours before or after surgery.   ____ 3. Do Not Smoke For 24 Hours Prior to Your Surgery.   ____ 4. Bring all medications with you on the day of surgery if instructed.    __x__ 5. Notify your doctor if there is any change in your medical condition     (cold, fever, infections).       Do not wear jewelry, make-up, hairpins, clips or nail polish.  Do not wear lotions, powders, or perfumes. You may wear deodorant.  Do not shave 48 hours prior to surgery. Men may shave face and neck.  Do not bring valuables to the hospital.    [facility] is not responsible for any belongings or valuables.               Contacts, dentures or bridgework may not be worn into surgery.  Leave your suitcase in the car. After surgery it may be brought to your room.  For patients admitted to the hospital, discharge time is determined by your                treatment team.   Patients discharged the day of surgery will not be allowed to drive home.   Please read over the following fact sheets that you were given:      _x___ Take these medicines the morning of surgery with A SIP OF WATER:    1. amLODipine (NORVASC) 5 MG tablet  2. losartan (COZAAR) 100 MG tablet  3. simvastatin (ZOCOR) 20 MG tablet  4.verapamil (CALAN-SR) 240 MG CR tablet  5.  6.  ____ Fleet Enema (as directed)   __x__ Use CHG Soap as directed  ____ Use inhalers on the day of surgery  ____ Stop metformin 2 days prior to  surgery    __x__ Take 1/2 of usual insulin dose the night before surgery and none on the morning of surgery.   __x__ Stop aspirin on 09/12/16  ____ Stop Anti-inflammatories on    ____ Stop supplements until after surgery.    ____ Bring C-Pap to the hospital.

## 2016-09-05 NOTE — Pre-Procedure Instructions (Signed)
Patient notified of need for type and screen.  Will return on 4/30 to have it drawn.

## 2016-09-06 LAB — URINE CULTURE
CULTURE: NO GROWTH
Special Requests: NORMAL

## 2016-09-09 ENCOUNTER — Encounter
Admission: RE | Admit: 2016-09-09 | Discharge: 2016-09-09 | Disposition: A | Discharge status: Home / Self Care | Payer: PPO | Source: Ambulatory Visit | Attending: Surgery | Admitting: Surgery

## 2016-09-09 DIAGNOSIS — Z01812 Encounter for preprocedural laboratory examination: Secondary | ICD-10-CM | POA: Diagnosis not present

## 2016-09-09 LAB — TYPE AND SCREEN
ABO/RH(D): O POS
ANTIBODY SCREEN: NEGATIVE

## 2016-09-10 DIAGNOSIS — I10 Essential (primary) hypertension: Secondary | ICD-10-CM | POA: Diagnosis not present

## 2016-09-10 DIAGNOSIS — E118 Type 2 diabetes mellitus with unspecified complications: Secondary | ICD-10-CM | POA: Diagnosis not present

## 2016-09-10 DIAGNOSIS — N184 Chronic kidney disease, stage 4 (severe): Secondary | ICD-10-CM | POA: Diagnosis not present

## 2016-09-19 ENCOUNTER — Encounter
Admission: RE | Disposition: A | Discharge status: Home Health | Payer: Self-pay | Source: Ambulatory Visit | Attending: Surgery

## 2016-09-19 ENCOUNTER — Inpatient Hospital Stay: Payer: PPO | Admitting: Anesthesiology

## 2016-09-19 ENCOUNTER — Inpatient Hospital Stay: Payer: PPO

## 2016-09-19 ENCOUNTER — Inpatient Hospital Stay
Admission: RE | Admit: 2016-09-19 | Discharge: 2016-09-20 | DRG: 483 | Disposition: A | Discharge status: Home Health | Payer: PPO | Source: Ambulatory Visit | Attending: Surgery | Admitting: Surgery

## 2016-09-19 ENCOUNTER — Encounter: Payer: Self-pay | Admitting: *Deleted

## 2016-09-19 DIAGNOSIS — Z96612 Presence of left artificial shoulder joint: Secondary | ICD-10-CM | POA: Diagnosis not present

## 2016-09-19 DIAGNOSIS — E1151 Type 2 diabetes mellitus with diabetic peripheral angiopathy without gangrene: Secondary | ICD-10-CM | POA: Diagnosis not present

## 2016-09-19 DIAGNOSIS — Z794 Long term (current) use of insulin: Secondary | ICD-10-CM

## 2016-09-19 DIAGNOSIS — Z471 Aftercare following joint replacement surgery: Secondary | ICD-10-CM | POA: Diagnosis not present

## 2016-09-19 DIAGNOSIS — N184 Chronic kidney disease, stage 4 (severe): Secondary | ICD-10-CM | POA: Diagnosis not present

## 2016-09-19 DIAGNOSIS — M19012 Primary osteoarthritis, left shoulder: Secondary | ICD-10-CM | POA: Diagnosis not present

## 2016-09-19 DIAGNOSIS — M12812 Other specific arthropathies, not elsewhere classified, left shoulder: Secondary | ICD-10-CM | POA: Diagnosis not present

## 2016-09-19 DIAGNOSIS — G8918 Other acute postprocedural pain: Secondary | ICD-10-CM | POA: Diagnosis not present

## 2016-09-19 DIAGNOSIS — Z87891 Personal history of nicotine dependence: Secondary | ICD-10-CM | POA: Diagnosis not present

## 2016-09-19 DIAGNOSIS — M75102 Unspecified rotator cuff tear or rupture of left shoulder, not specified as traumatic: Secondary | ICD-10-CM | POA: Diagnosis not present

## 2016-09-19 DIAGNOSIS — E1122 Type 2 diabetes mellitus with diabetic chronic kidney disease: Secondary | ICD-10-CM | POA: Diagnosis not present

## 2016-09-19 DIAGNOSIS — M75122 Complete rotator cuff tear or rupture of left shoulder, not specified as traumatic: Secondary | ICD-10-CM | POA: Diagnosis not present

## 2016-09-19 DIAGNOSIS — I129 Hypertensive chronic kidney disease with stage 1 through stage 4 chronic kidney disease, or unspecified chronic kidney disease: Secondary | ICD-10-CM | POA: Diagnosis present

## 2016-09-19 DIAGNOSIS — M25512 Pain in left shoulder: Secondary | ICD-10-CM | POA: Diagnosis not present

## 2016-09-19 HISTORY — PX: REVERSE SHOULDER ARTHROPLASTY: SHX5054

## 2016-09-19 LAB — POCT I-STAT 4, (NA,K, GLUC, HGB,HCT)
Glucose, Bld: 98 mg/dL (ref 65–99)
HEMATOCRIT: 35 % — AB (ref 39.0–52.0)
HEMOGLOBIN: 11.9 g/dL — AB (ref 13.0–17.0)
Potassium: 4.2 mmol/L (ref 3.5–5.1)
SODIUM: 142 mmol/L (ref 135–145)

## 2016-09-19 LAB — GLUCOSE, CAPILLARY
GLUCOSE-CAPILLARY: 95 mg/dL (ref 65–99)
Glucose-Capillary: 202 mg/dL — ABNORMAL HIGH (ref 65–99)
Glucose-Capillary: 208 mg/dL — ABNORMAL HIGH (ref 65–99)
Glucose-Capillary: 222 mg/dL — ABNORMAL HIGH (ref 65–99)

## 2016-09-19 LAB — ABO/RH: ABO/RH(D): O POS

## 2016-09-19 SURGERY — ARTHROPLASTY, SHOULDER, TOTAL, REVERSE
Anesthesia: General | Site: Shoulder | Laterality: Left | Wound class: Clean

## 2016-09-19 MED ORDER — DEXAMETHASONE SODIUM PHOSPHATE 10 MG/ML IJ SOLN
INTRAMUSCULAR | Status: AC
  Filled 2016-09-19: qty 1

## 2016-09-19 MED ORDER — ROCURONIUM BROMIDE 100 MG/10ML IV SOLN
INTRAVENOUS | Status: DC | PRN
  Administered 2016-09-19: 20 mg via INTRAVENOUS
  Administered 2016-09-19: 10 mg via INTRAVENOUS

## 2016-09-19 MED ORDER — BUPIVACAINE LIPOSOME 1.3 % IJ SUSP
INTRAMUSCULAR | Status: AC
  Filled 2016-09-19: qty 20

## 2016-09-19 MED ORDER — POTASSIUM CHLORIDE IN NACL 20-0.9 MEQ/L-% IV SOLN
INTRAVENOUS | Status: DC
  Administered 2016-09-19 – 2016-09-20 (×2): via INTRAVENOUS
  Filled 2016-09-19 (×3): qty 1000

## 2016-09-19 MED ORDER — DEXAMETHASONE SODIUM PHOSPHATE 10 MG/ML IJ SOLN
INTRAMUSCULAR | Status: DC | PRN
  Administered 2016-09-19: 10 mg via INTRAVENOUS

## 2016-09-19 MED ORDER — DEXTROSE 5 % IV SOLN
2.0000 g | Freq: Four times a day (QID) | INTRAVENOUS | Status: AC
  Administered 2016-09-19 – 2016-09-20 (×3): 2 g via INTRAVENOUS
  Filled 2016-09-19 (×3): qty 2000

## 2016-09-19 MED ORDER — FAMOTIDINE 20 MG PO TABS
ORAL_TABLET | ORAL | Status: AC
  Administered 2016-09-19: 20 mg via ORAL
  Filled 2016-09-19: qty 1

## 2016-09-19 MED ORDER — FENTANYL CITRATE (PF) 100 MCG/2ML IJ SOLN
INTRAMUSCULAR | Status: AC
  Administered 2016-09-19: 25 ug via INTRAVENOUS
  Filled 2016-09-19: qty 2

## 2016-09-19 MED ORDER — NEOMYCIN-POLYMYXIN B GU 40-200000 IR SOLN
Status: AC
  Filled 2016-09-19: qty 20

## 2016-09-19 MED ORDER — TRANEXAMIC ACID 1000 MG/10ML IV SOLN
INTRAVENOUS | Status: AC
  Filled 2016-09-19: qty 10

## 2016-09-19 MED ORDER — GLIPIZIDE ER 5 MG PO TB24
5.0000 mg | ORAL_TABLET | Freq: Every day | ORAL | Status: DC
  Administered 2016-09-19 – 2016-09-20 (×2): 5 mg via ORAL
  Filled 2016-09-19 (×2): qty 1

## 2016-09-19 MED ORDER — BISACODYL 10 MG RE SUPP: 10.0000 mg | Freq: Every day | RECTAL | Status: DC | PRN

## 2016-09-19 MED ORDER — SUGAMMADEX SODIUM 200 MG/2ML IV SOLN
INTRAVENOUS | Status: DC | PRN
  Administered 2016-09-19: 147.8 mg via INTRAVENOUS

## 2016-09-19 MED ORDER — ONDANSETRON HCL 4 MG/2ML IJ SOLN
4.0000 mg | Freq: Four times a day (QID) | INTRAMUSCULAR | Status: DC | PRN
Start: 2016-09-19 — End: 2016-09-20

## 2016-09-19 MED ORDER — CEFAZOLIN SODIUM-DEXTROSE 2-4 GM/100ML-% IV SOLN
2.0000 g | Freq: Four times a day (QID) | INTRAVENOUS | Status: DC
  Filled 2016-09-19 (×3): qty 100

## 2016-09-19 MED ORDER — ONDANSETRON HCL 4 MG/2ML IJ SOLN
INTRAMUSCULAR | Status: DC | PRN
  Administered 2016-09-19: 4 mg via INTRAVENOUS

## 2016-09-19 MED ORDER — PHENYLEPHRINE HCL 10 MG/ML IJ SOLN
INTRAVENOUS | Status: DC | PRN
  Administered 2016-09-19: 30 ug/min via INTRAVENOUS

## 2016-09-19 MED ORDER — BUPIVACAINE-EPINEPHRINE (PF) 0.5% -1:200000 IJ SOLN
INTRAMUSCULAR | Status: AC
  Filled 2016-09-19: qty 30

## 2016-09-19 MED ORDER — FENTANYL CITRATE (PF) 250 MCG/5ML IJ SOLN
INTRAMUSCULAR | Status: AC
  Filled 2016-09-19: qty 5

## 2016-09-19 MED ORDER — ACETAMINOPHEN 650 MG RE SUPP: 650.0000 mg | Freq: Four times a day (QID) | RECTAL | Status: DC | PRN

## 2016-09-19 MED ORDER — LIDOCAINE 2% (20 MG/ML) 5 ML SYRINGE
INTRAMUSCULAR | Status: DC | PRN
  Administered 2016-09-19: 100 mg via INTRAVENOUS

## 2016-09-19 MED ORDER — DIPHENHYDRAMINE HCL 12.5 MG/5ML PO ELIX: 12.5000 mg | ORAL_SOLUTION | ORAL | Status: DC | PRN

## 2016-09-19 MED ORDER — ASPIRIN EC 81 MG PO TBEC
81.0000 mg | DELAYED_RELEASE_TABLET | Freq: Every day | ORAL | Status: DC
  Administered 2016-09-19: 81 mg via ORAL
  Filled 2016-09-19: qty 1

## 2016-09-19 MED ORDER — HYDROMORPHONE HCL 1 MG/ML IJ SOLN: 0.5000 mg | INTRAMUSCULAR | Status: DC | PRN

## 2016-09-19 MED ORDER — SUGAMMADEX SODIUM 200 MG/2ML IV SOLN
INTRAVENOUS | Status: AC
  Filled 2016-09-19: qty 2

## 2016-09-19 MED ORDER — BUPIVACAINE LIPOSOME 1.3 % IJ SUSP
INTRAMUSCULAR | Status: DC | PRN
  Administered 2016-09-19: 60 mL

## 2016-09-19 MED ORDER — CEFAZOLIN SODIUM-DEXTROSE 2-4 GM/100ML-% IV SOLN
INTRAVENOUS | Status: AC
  Filled 2016-09-19: qty 100

## 2016-09-19 MED ORDER — BUPIVACAINE-EPINEPHRINE (PF) 0.5% -1:200000 IJ SOLN
INTRAMUSCULAR | Status: DC | PRN
  Administered 2016-09-19: 30 mL via PERINEURAL

## 2016-09-19 MED ORDER — PROPOFOL 10 MG/ML IV BOLUS
INTRAVENOUS | Status: AC
  Filled 2016-09-19: qty 20

## 2016-09-19 MED ORDER — DOCUSATE SODIUM 100 MG PO CAPS
100.0000 mg | ORAL_CAPSULE | Freq: Two times a day (BID) | ORAL | Status: DC
  Administered 2016-09-19 – 2016-09-20 (×3): 100 mg via ORAL
  Filled 2016-09-19 (×3): qty 1

## 2016-09-19 MED ORDER — FENTANYL CITRATE (PF) 100 MCG/2ML IJ SOLN: 25.0000 ug | INTRAMUSCULAR | Status: DC | PRN

## 2016-09-19 MED ORDER — SUCCINYLCHOLINE CHLORIDE 20 MG/ML IJ SOLN
INTRAMUSCULAR | Status: DC | PRN
  Administered 2016-09-19: 100 mg via INTRAVENOUS

## 2016-09-19 MED ORDER — ALLOPURINOL 100 MG PO TABS
100.0000 mg | ORAL_TABLET | Freq: Every day | ORAL | Status: DC
  Administered 2016-09-19 – 2016-09-20 (×2): 100 mg via ORAL
  Filled 2016-09-19 (×3): qty 1

## 2016-09-19 MED ORDER — PHENYLEPHRINE HCL 10 MG/ML IJ SOLN
INTRAMUSCULAR | Status: DC | PRN
  Administered 2016-09-19 (×8): 100 ug via INTRAVENOUS

## 2016-09-19 MED ORDER — CEFAZOLIN SODIUM-DEXTROSE 2-4 GM/100ML-% IV SOLN
2.0000 g | Freq: Once | INTRAVENOUS | Status: AC
  Administered 2016-09-19: 2 g via INTRAVENOUS

## 2016-09-19 MED ORDER — FLEET ENEMA 7-19 GM/118ML RE ENEM: 1.0000 | ENEMA | Freq: Once | RECTAL | Status: DC | PRN

## 2016-09-19 MED ORDER — EPHEDRINE SULFATE 50 MG/ML IJ SOLN
INTRAMUSCULAR | Status: DC | PRN
  Administered 2016-09-19: 10 mg via INTRAVENOUS

## 2016-09-19 MED ORDER — OXYCODONE HCL 5 MG PO TABS
5.0000 mg | ORAL_TABLET | ORAL | Status: DC | PRN
  Administered 2016-09-19 – 2016-09-20 (×2): 5 mg via ORAL
  Filled 2016-09-19 (×2): qty 1

## 2016-09-19 MED ORDER — VERAPAMIL HCL ER 240 MG PO TBCR
240.0000 mg | EXTENDED_RELEASE_TABLET | Freq: Every day | ORAL | Status: DC
  Administered 2016-09-20: 240 mg via ORAL
  Filled 2016-09-19: qty 1

## 2016-09-19 MED ORDER — PROPOFOL 10 MG/ML IV BOLUS
INTRAVENOUS | Status: DC | PRN
  Administered 2016-09-19: 100 mg via INTRAVENOUS

## 2016-09-19 MED ORDER — NEOMYCIN-POLYMYXIN B GU 40-200000 IR SOLN
Status: DC | PRN
  Administered 2016-09-19: 16 mL

## 2016-09-19 MED ORDER — SODIUM CHLORIDE 0.9 % IV SOLN
INTRAVENOUS | Status: DC
  Administered 2016-09-19 (×2): via INTRAVENOUS

## 2016-09-19 MED ORDER — LOSARTAN POTASSIUM 50 MG PO TABS
100.0000 mg | ORAL_TABLET | Freq: Every day | ORAL | Status: DC
  Administered 2016-09-20: 100 mg via ORAL
  Filled 2016-09-19: qty 2

## 2016-09-19 MED ORDER — METOCLOPRAMIDE HCL 5 MG/ML IJ SOLN: 5.0000 mg | Freq: Three times a day (TID) | INTRAMUSCULAR | Status: DC | PRN

## 2016-09-19 MED ORDER — ONDANSETRON HCL 4 MG/2ML IJ SOLN: 4.0000 mg | Freq: Once | INTRAMUSCULAR | Status: DC | PRN

## 2016-09-19 MED ORDER — LIDOCAINE HCL (PF) 1 % IJ SOLN
INTRAMUSCULAR | Status: AC
  Filled 2016-09-19: qty 5

## 2016-09-19 MED ORDER — ONDANSETRON HCL 4 MG/2ML IJ SOLN
INTRAMUSCULAR | Status: AC
  Filled 2016-09-19: qty 2

## 2016-09-19 MED ORDER — INSULIN GLARGINE 100 UNIT/ML ~~LOC~~ SOLN
15.0000 [IU] | Freq: Every day | SUBCUTANEOUS | Status: DC
  Administered 2016-09-19: 15 [IU] via SUBCUTANEOUS
  Filled 2016-09-19 (×2): qty 0.15

## 2016-09-19 MED ORDER — ONDANSETRON HCL 4 MG PO TABS: 4.0000 mg | ORAL_TABLET | Freq: Four times a day (QID) | ORAL | Status: DC | PRN

## 2016-09-19 MED ORDER — MIDAZOLAM HCL 2 MG/2ML IJ SOLN
INTRAMUSCULAR | Status: AC
  Administered 2016-09-19: 1 mg via INTRAVENOUS
  Filled 2016-09-19: qty 2

## 2016-09-19 MED ORDER — FENTANYL CITRATE (PF) 100 MCG/2ML IJ SOLN
INTRAMUSCULAR | Status: DC | PRN
  Administered 2016-09-19: 50 ug via INTRAVENOUS

## 2016-09-19 MED ORDER — AMLODIPINE BESYLATE 5 MG PO TABS
5.0000 mg | ORAL_TABLET | Freq: Every day | ORAL | Status: DC
  Administered 2016-09-20: 5 mg via ORAL
  Filled 2016-09-19: qty 1

## 2016-09-19 MED ORDER — ACETAMINOPHEN 325 MG PO TABS: 650.0000 mg | ORAL_TABLET | Freq: Four times a day (QID) | ORAL | Status: DC | PRN

## 2016-09-19 MED ORDER — ACETAMINOPHEN 500 MG PO TABS
1000.0000 mg | ORAL_TABLET | Freq: Four times a day (QID) | ORAL | Status: AC
  Administered 2016-09-19 – 2016-09-20 (×4): 1000 mg via ORAL
  Filled 2016-09-19 (×4): qty 2

## 2016-09-19 MED ORDER — MAGNESIUM HYDROXIDE 400 MG/5ML PO SUSP: 30.0000 mL | Freq: Every day | ORAL | Status: DC | PRN

## 2016-09-19 MED ORDER — ROPIVACAINE HCL 5 MG/ML IJ SOLN
INTRAMUSCULAR | Status: AC
  Filled 2016-09-19: qty 30

## 2016-09-19 MED ORDER — SODIUM CHLORIDE 0.9 % IJ SOLN
INTRAMUSCULAR | Status: AC
  Filled 2016-09-19: qty 50

## 2016-09-19 MED ORDER — ENOXAPARIN SODIUM 30 MG/0.3ML ~~LOC~~ SOLN
30.0000 mg | SUBCUTANEOUS | Status: DC
  Administered 2016-09-20: 30 mg via SUBCUTANEOUS
  Filled 2016-09-19: qty 0.3

## 2016-09-19 MED ORDER — SIMVASTATIN 20 MG PO TABS
20.0000 mg | ORAL_TABLET | Freq: Every day | ORAL | Status: DC
  Administered 2016-09-19 – 2016-09-20 (×2): 20 mg via ORAL
  Filled 2016-09-19 (×2): qty 1

## 2016-09-19 MED ORDER — FUROSEMIDE 20 MG PO TABS
20.0000 mg | ORAL_TABLET | Freq: Every day | ORAL | Status: DC
  Administered 2016-09-19 – 2016-09-20 (×2): 20 mg via ORAL
  Filled 2016-09-19 (×2): qty 1

## 2016-09-19 MED ORDER — MIDAZOLAM HCL 2 MG/2ML IJ SOLN
1.0000 mg | Freq: Once | INTRAMUSCULAR | Status: AC
  Administered 2016-09-19: 1 mg via INTRAVENOUS

## 2016-09-19 MED ORDER — METOCLOPRAMIDE HCL 10 MG PO TABS: 5.0000 mg | ORAL_TABLET | Freq: Three times a day (TID) | ORAL | Status: DC | PRN

## 2016-09-19 MED ORDER — FAMOTIDINE 20 MG PO TABS
20.0000 mg | ORAL_TABLET | Freq: Once | ORAL | Status: AC
  Administered 2016-09-19: 20 mg via ORAL

## 2016-09-19 MED ORDER — PANTOPRAZOLE SODIUM 40 MG PO TBEC
40.0000 mg | DELAYED_RELEASE_TABLET | Freq: Every day | ORAL | Status: DC
  Administered 2016-09-19 – 2016-09-20 (×2): 40 mg via ORAL
  Filled 2016-09-19 (×2): qty 1

## 2016-09-19 MED ORDER — FENTANYL CITRATE (PF) 100 MCG/2ML IJ SOLN
25.0000 ug | Freq: Once | INTRAMUSCULAR | Status: AC
  Administered 2016-09-19: 25 ug via INTRAVENOUS

## 2016-09-19 SURGICAL SUPPLY — 62 items
BIT DRILL TWIST 2.7 (BIT) ×2 IMPLANT
BIT DRILL TWIST 2.7MM (BIT) ×1
BLADE SAGITTAL WIDE XTHICK NO (BLADE) ×3 IMPLANT
CANISTER SUCT 1200ML W/VALVE (MISCELLANEOUS) ×3 IMPLANT
CANISTER SUCT 3000ML PPV (MISCELLANEOUS) ×6 IMPLANT
CAPT SHLDR REVTOTAL 2 ×3 IMPLANT
CATH TRAY METER 16FR LF (MISCELLANEOUS) ×3 IMPLANT
CHLORAPREP W/TINT 26ML (MISCELLANEOUS) ×3 IMPLANT
COOLER POLAR GLACIER W/PUMP (MISCELLANEOUS) ×3 IMPLANT
CRADLE LAMINECT ARM (MISCELLANEOUS) ×3 IMPLANT
DRAPE IMP U-DRAPE 54X76 (DRAPES) ×6 IMPLANT
DRAPE INCISE IOBAN 66X45 STRL (DRAPES) ×6 IMPLANT
DRAPE INCISE IOBAN 66X60 STRL (DRAPES) ×3 IMPLANT
DRAPE SHEET LG 3/4 BI-LAMINATE (DRAPES) ×6 IMPLANT
DRAPE TABLE BACK 80X90 (DRAPES) ×3 IMPLANT
DRSG OPSITE POSTOP 4X8 (GAUZE/BANDAGES/DRESSINGS) ×3 IMPLANT
ELECT CAUTERY BLADE 6.4 (BLADE) ×3 IMPLANT
GLOVE BIO SURGEON STRL SZ7.5 (GLOVE) ×12 IMPLANT
GLOVE BIO SURGEON STRL SZ8 (GLOVE) ×12 IMPLANT
GLOVE BIOGEL PI IND STRL 8 (GLOVE) ×1 IMPLANT
GLOVE BIOGEL PI INDICATOR 8 (GLOVE) ×2
GLOVE INDICATOR 8.0 STRL GRN (GLOVE) ×3 IMPLANT
GOWN STRL REUS W/ TWL LRG LVL3 (GOWN DISPOSABLE) ×1 IMPLANT
GOWN STRL REUS W/ TWL XL LVL3 (GOWN DISPOSABLE) ×1 IMPLANT
GOWN STRL REUS W/TWL LRG LVL3 (GOWN DISPOSABLE) ×2
GOWN STRL REUS W/TWL XL LVL3 (GOWN DISPOSABLE) ×2
HOOD PEEL AWAY FLYTE STAYCOOL (MISCELLANEOUS) ×9 IMPLANT
KIT RM TURNOVER STRD PROC AR (KITS) ×3 IMPLANT
KIT STABILIZATION SHOULDER (MISCELLANEOUS) ×3 IMPLANT
MASK FACE SPIDER DISP (MASK) ×3 IMPLANT
NDL MAYO CATGUT SZ1 (NEEDLE)
NDL MAYO CATGUT SZ5 (NEEDLE)
NDL SAFETY 22GX1.5 (NEEDLE) ×3 IMPLANT
NDL SUT 5 .5 CRC TPR PNT MAYO (NEEDLE) IMPLANT
NEEDLE 18GX1X1/2 (RX/OR ONLY) (NEEDLE) ×3 IMPLANT
NEEDLE HYPO 25X1 1.5 SAFETY (NEEDLE) ×3 IMPLANT
NEEDLE MAYO CATGUT SZ1 (NEEDLE) IMPLANT
NEEDLE MAYO CATGUT SZ4 (NEEDLE) IMPLANT
NEEDLE SPNL 20GX3.5 QUINCKE YW (NEEDLE) ×3 IMPLANT
NS IRRIG 1000ML POUR BTL (IV SOLUTION) ×3 IMPLANT
PACK ARTHROSCOPY SHOULDER (MISCELLANEOUS) ×3 IMPLANT
PAD WRAPON POLAR SHDR UNIV (MISCELLANEOUS) ×1 IMPLANT
PIN THREADED REVERSE (PIN) ×3 IMPLANT
PULSAVAC PLUS IRRIG FAN TIP (DISPOSABLE) ×3
SLING ULTRA II M (MISCELLANEOUS) ×3 IMPLANT
SOL .9 NS 3000ML IRR  AL (IV SOLUTION) ×2
SOL .9 NS 3000ML IRR UROMATIC (IV SOLUTION) ×1 IMPLANT
SPONGE LAP 18X18 5 PK (GAUZE/BANDAGES/DRESSINGS) ×3 IMPLANT
STAPLER SKIN PROX 35W (STAPLE) ×3 IMPLANT
SUT ETHIBOND 0 MO6 C/R (SUTURE) ×3 IMPLANT
SUT ETHIBOND NAB CT1 #1 30IN (SUTURE) ×3 IMPLANT
SUT FIBERWIRE #2 38 BLUE 1/2 (SUTURE) ×12
SUT VIC AB 0 CT1 36 (SUTURE) ×6 IMPLANT
SUT VIC AB 2-0 CT1 27 (SUTURE) ×6
SUT VIC AB 2-0 CT1 TAPERPNT 27 (SUTURE) ×3 IMPLANT
SUTURE FIBERWR #2 38 BLUE 1/2 (SUTURE) ×4 IMPLANT
SYR 30ML LL (SYRINGE) ×6 IMPLANT
SYRINGE 10CC LL (SYRINGE) ×3 IMPLANT
TIP FAN IRRIG PULSAVAC PLUS (DISPOSABLE) ×1 IMPLANT
TUBE CONNECTING  6'X3/16 (MISCELLANEOUS) ×1
TUBE CONNECTING 6X3/16 (MISCELLANEOUS) ×2 IMPLANT
WRAPON POLAR PAD SHDR UNIV (MISCELLANEOUS) ×3

## 2016-09-19 NOTE — Progress Notes (Signed)
Pt arrived from the PACU via bed at 1120. Pt awake, alert and oriented, in no distress, denied pain. Pt initially on O2 at [facility], this was removed after a few minutes. Lungs decreased to bilat bases, pt is not sob. Hr is regular, abdomen is soft, bs heard. Pt has immobilizer intact to l shoulder, and polar care intact on top of honeycomb dressing. Pt denied pain. Pt is given urinal for prn use,ppp, teds and foot pumps on bilat. piv #20 intact to r arm, ivf infusing per md order. Pt denied nausea and already tolerating clear liquids, diet order entered for pt. Pt's son arrived in room, this writer reviewed plan of care for pt and available pain meds, etc with both of them, both verbalized understanding. Since arrival, pt has tolerated lunch, received meds, continues with no pain.  Pt oriented to room and call bell.

## 2016-09-19 NOTE — Anesthesia Procedure Notes (Signed)
Procedure Name: Intubation Date/Time: 09/19/2016 9:03 AM Performed by: Marsh Dolly Pre-anesthesia Checklist: Patient identified, Patient being monitored, Timeout performed, Emergency Drugs available and Suction available Patient Re-evaluated:Patient Re-evaluated prior to inductionOxygen Delivery Method: Circle system utilized Preoxygenation: Pre-oxygenation with 100% oxygen Intubation Type: IV induction Ventilation: Mask ventilation without difficulty Laryngoscope Size: 3 and Miller Grade View: Grade I Tube type: Oral Tube size: 7.5 mm Number of attempts: 1 Airway Equipment and Method: Stylet Placement Confirmation: ETT inserted through vocal cords under direct vision,  positive ETCO2 and breath sounds checked- equal and bilateral Secured at: 21 cm Tube secured with: Tape Dental Injury: Teeth and Oropharynx as per pre-operative assessment

## 2016-09-19 NOTE — Progress Notes (Signed)
Pt is oob to chair after walking with physica ltherapy. Pt continues to state he has no pain to l shoulder, but did state that he can feel it beginning to "wake up". Pt is encouraged to ask for pain medication before pain gets intolerable. Visitor at bedside.

## 2016-09-19 NOTE — Anesthesia Post-op Follow-up Note (Cosign Needed)
Anesthesia QCDR form completed.        

## 2016-09-19 NOTE — Anesthesia Preprocedure Evaluation (Signed)
Anesthesia Evaluation  Patient identified by MRN, date of birth, ID band Patient awake    Reviewed: Allergy & Precautions, H&P , NPO status , Patient's Chart, lab work & pertinent test results, reviewed documented beta blocker date and time   Airway Mallampati: III  TM Distance: >3 FB Neck ROM: full    Dental  (+) Teeth Intact   Pulmonary neg pulmonary ROS, former smoker,    Pulmonary exam normal        Cardiovascular hypertension, + Peripheral Vascular Disease  negative cardio ROS Normal cardiovascular exam Rhythm:regular Rate:Normal     Neuro/Psych negative neurological ROS  negative psych ROS   GI/Hepatic negative GI ROS, Neg liver ROS,   Endo/Other  negative endocrine ROSdiabetes  Renal/GU Renal diseasenegative Renal ROS  negative genitourinary   Musculoskeletal   Abdominal   Peds  Hematology negative hematology ROS (+)   Anesthesia Other Findings Past Medical History: No date: Cancer ([place])     Comment: colon No date: Chronic kidney disease No date: Diabetes mellitus without complication ([place]) No date: Hypertension Past Surgical History: No date: COLON SURGERY No date: EYE SURGERY     Comment: bilateral No date: ROTATOR CUFF REPAIR Right BMI    Body Mass Index:  25.53 kg/m     Reproductive/Obstetrics negative OB ROS                             Anesthesia Physical Anesthesia Plan  ASA: III  Anesthesia Plan: General ETT   Post-op Pain Management:  Regional for Post-op pain   Induction:   Airway Management Planned:   Additional Equipment:   Intra-op Plan:   Post-operative Plan:   Informed Consent: I have reviewed the patients History and Physical, chart, labs and discussed the procedure including the risks, benefits and alternatives for the proposed anesthesia with the patient or authorized representative who has indicated his/her understanding and acceptance.    Dental Advisory Given  Plan Discussed with: CRNA  Anesthesia Plan Comments:         Anesthesia Quick Evaluation

## 2016-09-19 NOTE — Anesthesia Procedure Notes (Signed)
Anesthesia Regional Block: Narrative:       

## 2016-09-19 NOTE — H&P (Signed)
Paper H&P to be scanned into permanent record. H&P reviewed and patient re-examined. No changes. 

## 2016-09-19 NOTE — Evaluation (Signed)
Physical Therapy Evaluation Patient Details Name: Dylan Sanders MRN: [id number] DOB: Aug 04, 1931 Today's Date: 09/19/2016   History of Present Illness  83 y/o male s/p L reverse total shoulder replacement 09/19/16.  Clinical Impression  Pt did well with PT exam and showed no safety concerns regarding mobility, ambulation, stair negotiation, etc.  He did not need assist with any acts (did need to use momentum to get himself up to sitting EOB secondary to having only 1 UE to assist) and had good safety and confidence with all tasks.  Pt aware of AROM precautions for the L shoulder and understands that he needs to let it heal even though he is doing so well with everything else.  Pt eager to work with PT and do what he can to get back to independent/functional baseline.     Follow Up Recommendations Home health PT    Equipment Recommendations  None recommended by PT    Recommendations for Other Services       Precautions / Restrictions Precautions Precautions: Shoulder Shoulder Interventions: Shoulder sling/immobilizer Precaution Booklet Issued: Yes (comment) Restrictions Weight Bearing Restrictions: Yes RUE Weight Bearing: Non weight bearing      Mobility  Bed Mobility Overal bed mobility: Modified Independent             General bed mobility comments: Pt with great effort getting up to EOB w/o rails and with only R UE  Transfers Overall transfer level: Independent Equipment used: None             General transfer comment: Pt able to stand w/o any UE use, showed good balance and confidence  Ambulation/Gait Ambulation/Gait assistance: Independent Ambulation Distance (Feet): 250 Feet Assistive device: None       General Gait Details: Pt walked with good speed, confidence and had no balance, fatigue or safety issues.  Stairs Stairs: Yes Stairs assistance: Modified independent (Device/Increase time) Stair Management: One rail Right Number of Stairs: 4 General  stair comments: Pt able to negotiate up/down steps safely and w/o issue  Wheelchair Mobility    Modified Rankin (Stroke Patients Only)       Balance Overall balance assessment: Independent                                           Pertinent Vitals/Pain Pain Assessment:  (very minimal pain, L UE still mostly numb)    Home Living Family/patient expects to be discharged to:: Private residence Living Arrangements: Alone Available Help at Discharge: Family (son will be staying with him when he first goes home)   Home Access: Stairs to enter Entrance Stairs-Rails: Right Entrance Stairs-Number of Steps: 3   Home Equipment: Cane - single point      Prior Function Level of Independence: Independent         Comments: Pt is active, does all errands, goes bowling     Hand Dominance        Extremity/Trunk Assessment   Upper Extremity Assessment Upper Extremity Assessment: Overall WFL for tasks assessed (L finger/wrist movement WFL, shld not tested)    Lower Extremity Assessment Lower Extremity Assessment: Overall WFL for tasks assessed       Communication   Communication: No difficulties  Cognition Arousal/Alertness: Awake/alert Behavior During Therapy: WFL for tasks assessed/performed Overall Cognitive Status: Within Functional Limits for tasks assessed  General Comments      Exercises     Assessment/Plan    PT Assessment Patient needs continued PT services  PT Problem List Decreased strength;Decreased range of motion;Decreased activity tolerance;Decreased mobility;Decreased safety awareness;Decreased knowledge of precautions       PT Treatment Interventions Gait training;Stair training;Therapeutic activities;Functional mobility training;Therapeutic exercise;Neuromuscular re-education;Patient/family education    PT Goals (Current goals can be found in the Care Plan section)  Acute  Rehab PT Goals Patient Stated Goal: go home PT Goal Formulation: With patient Time For Goal Achievement: 10/03/16 Potential to Achieve Goals: Good    Frequency 7X/week   Barriers to discharge        Co-evaluation               AM-PAC PT "6 Clicks" Daily Activity  Outcome Measure Difficulty turning over in bed (including adjusting bedclothes, sheets and blankets)?: A Little Difficulty moving from lying on back to sitting on the side of the bed? : A Little Difficulty sitting down on and standing up from a chair with arms (e.g., wheelchair, bedside commode, etc,.)?: None Help needed moving to and from a bed to chair (including a wheelchair)?: None Help needed walking in hospital room?: None Help needed climbing 3-5 steps with a railing? : None 6 Click Score: 22    End of Session Equipment Utilized During Treatment: Gait belt Activity Tolerance: Patient tolerated treatment well Patient left: with chair alarm set;with call bell/phone within reach   PT Visit Diagnosis: Muscle weakness (generalized) (M62.81)    Time: 9528-4132 PT Time Calculation (min) (ACUTE ONLY): 27 min   Charges:   PT Evaluation $PT Eval Low Complexity: 1 Procedure     PT G CodesKreg Shropshire, DPT 09/19/2016, 4:26 PM

## 2016-09-19 NOTE — Care Management Note (Signed)
Case Management Note  Patient Details  Name: Raynaldo R Dixson MRN: [id number] Date of Birth: 01/01/1932  Subjective/Objective:  Met with patient at bedside to discuss discharge planning. PT recommends home PT. [facility]erral [facility]. Patient lives alone. Son with assist as needed. No DME needs.   Action/Plan: [facility] for HH PT  Expected Discharge Date:  09/20/16               Expected Discharge Plan:  Home w Home Health Services  In-House Referral:     Discharge planning Services  CM Consult  Post Acute Care Choice:  Home Health Choice offered to:  Patient  DME Arranged:    DME Agency:     HH Arranged:  PT, OT HH Agency:  [facility] (now [facility])  Status of Service:  In process, will continue to follow  If discussed at Long Length of Stay Meetings, dates discussed:    Additional Comments:  Lisa M Jacobs, RN 09/19/2016, 3:26 PM  

## 2016-09-19 NOTE — Op Note (Signed)
09/19/2016  10:24 AM  Patient:   Dylan Sanders  Pre-Op Diagnosis:   Massive irreparable rotator cuff tear with cuff arthropathy, left shoulder.  Post-Op Diagnosis:   Same.  Procedure:   Reverse left total shoulder arthroplasty.  Surgeon:   Pascal Lux, MD  Assistant:   Cameron Proud, PA-C  Anesthesia:   General endotracheal with an interscalene block placed preoperatively by the anesthesiologist.  Findings:   As above.  Complications:   None  EBL:  150 cc  Fluids:   800 cc crystalloid  UOP:   None  TT:   None  Drains:   None  Closure:   Staples  Implants:   All press-fit Biomet Comprehensive system with a #13 mini-humeral stem, a 44 mm humeral tray with a standard insert, and a mini-base plate with a 36 mm glenosphere.  Brief Clinical Note:   The patient is a 83 year old male with a history of left shoulder pain and weakness. His symptoms have persisted despite medications, activity modification, etc. His history and examination are consistent with a massive rotator cuff tear with cuff arthropathy, confirmed by MRI scan. The patient presents at this time for a reverse left total shoulder arthroplasty.  Procedure:   The patient underwent placement of an interscalene block by the anesthesiologist in the preoperative holding area before being brought into the operating room and lain in the supine position. The patient then underwent general endotracheal intubation and anesthesia before a Foley catheter was inserted and the patient repositioned in the beach chair position using the beach chair positioner. The left shoulder and upper extremity were prepped with ChloraPrep solution before being draped sterilely. Preoperative antibiotics were administered. A standard anterior approach to the shoulder was made through an approximately 4-5 inch incision. The incision was carried down through the subcutaneous tissues to expose the deltopectoral fascia. The interval between the  deltoid and pectoralis muscles was identified and this plane developed, retracting the cephalic vein laterally with the deltoid muscle. During retraction, the vein was torn so it was tied off using 2-0 Vicryl interrupted sutures. The conjoined tendon was identified. Its lateral margin was dissected and the Kolbel self-retraining retractor inserted. The "three sisters" were identified, tied off with 2-0 Vicryl interrupted sutures, and then cauterized. Bursal tissues were removed to improve visualization. The subscapularis tendon was released from its attachment to the lesser tuberosity 1 cm proximal to its insertion and several tagging sutures placed. The inferior capsule was released with care after identifying and protecting the axillary nerve. The proximal humeral cut was made at approximately 30 of retroversion using the extra-medullary guide.   Attention was redirected to the glenoid. The labrum was debrided circumferentially before the center of the glenoid was marked with electrocautery. The guidewire was drilled into the glenoid neck using the appropriate guide. After verifying its position, it was overreamed with the mini-baseplate reamer to create a flat surface. The permanent mini-baseplate was impacted into place. It was stabilized with a 25 x 6.5 mm central screw and four peripheral screws. Locking screws were placed superiorly and inferiorly while nonlocking screws were placed anteriorly and posteriorly. The permanent 36 mm glenosphere was then impacted into place and its Morse taper locking mechanism verified using manual distraction.  Attention was directed to the humeral side. The humeral canal was reamed sequentially beginning with the end-cutting reamer then progressing from a 4 mm reamer up to a 13 mm reamer. This provided excellent circumferential chatter. The canal was broached beginning with a #  11 broach and progressing to a #13 broach. This was left in place and a trial reduction  performed using the standard trial humeral platform. The arm demonstrated excellent range of motion as the hand could be brought across the chest to the opposite shoulder and brought to the top of the patient's head and to the patient's ear. The shoulder appeared stable throughout this range of motion. The joint was dislocated and the trial components removed. The permanent #13 mini-stem was impacted into place with care taken to maintain the appropriate version. The permanent 44 mm humeral platform with the standard insert was put together on the back table and impacted into place. Again, the [facility] taper locking mechanism was verified using manual distraction. The shoulder was relocated using firm two finger pressure and again placed through a range of motion with the findings as described above.  The wound was copiously irrigated with bacitracin saline solution using the jet lavage system before a total of 20 cc of Exparel diluted out to 60 cc with normal saline and 30 cc of 0.5% Sensorcaine with epinephrine was injected into the pericapsular and peri-incisional tissues to help with postoperative analgesia. The subscapularis tendon was reapproximated using #2 FiberWire interrupted sutures. The deltopectoral interval was closed using #0 Vicryl interrupted sutures before the subcutaneous tissues were closed using 2-0 Vicryl interrupted sutures. The skin was closed using staples. Prior to closing the skin, 1 g of transexemic acid in 10 cc of normal saline was injected intra-articularly to help with postoperative bleeding. A sterile occlusive dressing was applied to the wound before the arm was placed into a shoulder immobilizer with an abduction pillow. A Polar Care system also was applied to the shoulder. The patient was then transferred back to a hospital bed before being awakened, extubated, and returned to the recovery room in satisfactory condition after tolerating the procedure well.

## 2016-09-19 NOTE — Anesthesia Procedure Notes (Signed)
Anesthesia Regional Block: Interscalene brachial plexus block   Pre-Anesthetic Checklist: ,, timeout performed, Correct Patient, Correct Site, Correct Laterality, Correct Procedure, Correct Position, site marked, Risks and benefits discussed,  Surgical consent,  Pre-op evaluation,  At surgeon's request and post-op pain management   Prep: chloraprep       Needles:  Injection technique: Single-shot  Needle Type: Echogenic Stimulator Needle     Needle Length: 5cm  Needle Gauge: 22     Additional Needles:   Procedures: ultrasound guided, nerve stimulator,,,,,,  Narrative:  Injection made incrementally with aspirations every 5 mL.  Performed by: Personally   Additional Notes: Functioning IV was confirmed and monitors were applied.  A 27mm 22ga Stimuplex echogenic needle was used. Sterile prep hand hygiene and sterile gloves were used.  Negative aspiration and negative test dose prior to incremental administration of local anesthetic. The patient tolerated the procedure well with easy injection and no pain.   30cc 0.5 Rovivicaine used.  JA

## 2016-09-19 NOTE — Transfer of Care (Signed)
Immediate Anesthesia Transfer of Care Note  Patient: Dylan Sanders  Procedure(s) Performed: Procedure(s): REVERSE SHOULDER ARTHROPLASTY (Left)  Patient Location: PACU  Anesthesia Type:General and Regional  Level of Consciousness: awake, alert  and oriented  Airway & Oxygen Therapy: Patient Spontanous Breathing and Patient connected to face mask oxygen  Post-op Assessment: Report given to RN and Post -op Vital signs reviewed and stable  Post vital signs: Reviewed and stable  Last Vitals:  Vitals:   09/19/16 0752 09/19/16 0757  BP: (!) 124/53 (!) 117/53  Pulse: (!) 50 (!) 48  Resp: 16 14  Temp:      Last Pain:  Vitals:   09/19/16 0747  TempSrc:   PainSc: 0-No pain      Patients Stated Pain Goal: 0 (71/21/97 5883)  Complications: No apparent anesthesia complications

## 2016-09-19 NOTE — OR Nursing (Signed)
Pt transferred to PACU for nerve block via stretcher report given to Shary Key, RN

## 2016-09-20 LAB — BASIC METABOLIC PANEL
Anion gap: 7 (ref 5–15)
BUN: 49 mg/dL — ABNORMAL HIGH (ref 6–20)
CO2: 20 mmol/L — AB (ref 22–32)
CREATININE: 2.96 mg/dL — AB (ref 0.61–1.24)
Calcium: 8.4 mg/dL — ABNORMAL LOW (ref 8.9–10.3)
Chloride: 110 mmol/L (ref 101–111)
GFR calc non Af Amer: 18 mL/min — ABNORMAL LOW (ref >60)
GFR, EST AFRICAN AMERICAN: 21 mL/min — AB (ref >60)
GLUCOSE: 185 mg/dL — AB (ref 65–99)
Potassium: 5 mmol/L (ref 3.5–5.1)
Sodium: 137 mmol/L (ref 135–145)

## 2016-09-20 LAB — CBC WITH DIFFERENTIAL/PLATELET
BASOS PCT: 0 %
Basophils Absolute: 0 10*3/uL (ref 0–0.1)
EOS PCT: 0 %
Eosinophils Absolute: 0 10*3/uL (ref 0–0.7)
HEMATOCRIT: 33.6 % — AB (ref 40.0–52.0)
Hemoglobin: 11.4 g/dL — ABNORMAL LOW (ref 13.0–18.0)
Lymphocytes Relative: 5 %
Lymphs Abs: 0.7 10*3/uL — ABNORMAL LOW (ref 1.0–3.6)
MCH: 32.9 pg (ref 26.0–34.0)
MCHC: 34.1 g/dL (ref 32.0–36.0)
MCV: 96.4 fL (ref 80.0–100.0)
MONOS PCT: 5 %
Monocytes Absolute: 0.7 10*3/uL (ref 0.2–1.0)
Neutro Abs: 12.8 10*3/uL — ABNORMAL HIGH (ref 1.4–6.5)
Neutrophils Relative %: 90 %
Platelets: 167 10*3/uL (ref 150–440)
RBC: 3.48 MIL/uL — ABNORMAL LOW (ref 4.40–5.90)
RDW: 14.2 % (ref 11.5–14.5)
WBC: 14.1 10*3/uL — ABNORMAL HIGH (ref 3.8–10.6)

## 2016-09-20 LAB — GLUCOSE, CAPILLARY: GLUCOSE-CAPILLARY: 157 mg/dL — AB (ref 65–99)

## 2016-09-20 MED ORDER — OXYCODONE HCL 5 MG PO TABS: 5.0000 mg | ORAL_TABLET | ORAL | 0 refills | Status: DC | PRN

## 2016-09-20 NOTE — Clinical Social Work Note (Signed)
CSW consulted for New SNF. PT is recommending HHPT. RNCM will follow for discharge planning needs. CSW is signing off as no further needs identified.   Darden Dates, MSW, LCSW  Clinical Social Worker [phone]

## 2016-09-20 NOTE — Evaluation (Signed)
Occupational Therapy Evaluation Patient Details Name: Dylan Sanders MRN: [id number] DOB: Nov 24, 1931 Today's Date: 09/20/2016    History of Present Illness Pt. is an 83 y.o. male who was amitted for a Left Reverse Total Shoulder replacement.   Clinical Impression   Pt. Is an 83 y.o. Male who was admitted to [facility] for a Left Reverse Total Shoulder Replacement. Pt. Is right hand dominant. Pt. Requires assist for set-up of basic self-care tasks, pt. Requires increased assistance for UE dressing. Pt. Is independent with mobility, and toileting skills. Pt. Plans to return home upon discharge. Pt. Son will be with pt. at discharge, and will be able to assist pt. as needed with self-care and meal preparation.     Follow Up Recommendations  Home health OT    Equipment Recommendations       Recommendations for Other Services       Precautions / Restrictions Precautions Precautions: Shoulder Shoulder Interventions: Shoulder sling/immobilizer Restrictions Weight Bearing Restrictions: Yes RUE Weight Bearing: Non weight bearing                                                    ADL either performed or assessed with clinical judgement   ADL Overall ADL's : Needs assistance/impaired Eating/Feeding: Set up   Grooming: Minimal assistance           Upper Body Dressing : Moderate assistance   Lower Body Dressing: Minimal assistance (assist with belt buckle, zipper, hiking.)   Toilet Transfer: Independent           Functional mobility during ADLs: Independent General ADL Comments: Pt. education was provided about adaptive techniques for ADLs, and IADLs.     Vision Baseline Vision/History: No visual deficits       Perception     Praxis      Pertinent Vitals/Pain       Hand Dominance Right   Extremity/Trunk Assessment Upper Extremity Assessment Upper Extremity Assessment: LUE deficits/detail;RUE deficits/detail RUE Deficits / Details: WFL LUE:  Unable to fully assess due to immobilization           Communication Communication Communication: No difficulties   Cognition Arousal/Alertness: Awake/alert Behavior During Therapy: WFL for tasks assessed/performed Overall Cognitive Status: Within Functional Limits for tasks assessed                                     General Comments       Exercises     Shoulder Instructions      Home Living Family/patient expects to be discharged to:: Private residence Living Arrangements: Alone Available Help at Discharge: Family   Home Access: Stairs to enter   Entrance Stairs-Rails: Right Home Layout: One level         Bathroom Toilet: Standard Bathroom Accessibility: Yes   Home Equipment: Cane - single point          Prior Functioning/Environment Level of Independence: Independent        Comments: Independent with ADLs, IADLS, driving, meal prep, medication management, and bowling        OT Problem List: Decreased strength;Decreased range of motion;Pain;Impaired UE functional use;Decreased knowledge of use of DME or AE;Decreased coordination      OT Treatment/Interventions: Self-care/ADL training;Therapeutic exercise;DME and/or AE instruction;Patient/family education;Therapeutic activities  OT Goals(Current goals can be found in the care plan section) Acute Rehab OT Goals Patient Stated Goal: To return home OT Goal Formulation: With patient Potential to Achieve Goals: Good  OT Frequency: Min 1X/week   Barriers to D/C:            Co-evaluation              AM-PAC PT "6 Clicks" Daily Activity     Outcome Measure Help from another person eating meals?: A Little Help from another person taking care of personal grooming?: A Little Help from another person toileting, which includes using toliet, bedpan, or urinal?: None Help from another person bathing (including washing, rinsing, drying)?: A Little Help from another person to put on  and taking off regular upper body clothing?: A Lot Help from another person to put on and taking off regular lower body clothing?: A Little 6 Click Score: 18   End of Session    Activity Tolerance: Patient tolerated treatment well Patient left: with call bell/phone within reach;in chair;with family/visitor present  OT Visit Diagnosis: Muscle weakness (generalized) (M62.81);History of falling (Z91.81)                Time: 0950-1005 OT Time Calculation (min): 15 min Charges:  OT General Charges $OT Visit: 1 Procedure OT Evaluation $OT Eval Low Complexity: 1 Procedure G-Codes:     Harrel Carina, MS, OTR/L  Harrel Carina, MS, OTR/L 09/20/2016, 11:36 AM

## 2016-09-20 NOTE — Progress Notes (Deleted)
Inpatient Diabetes Program Recommendations  AACE/ADA: New Consensus Statement on Inpatient Glycemic Control (2015)  Target Ranges:  Prepandial:   less than 140 mg/dL      Peak postprandial:   less than 180 mg/dL (1-2 hours)      Critically ill patients:  140 - 180 mg/dL   Lab Results  Component Value Date   GLUCAP 157 (H) 09/20/2016    Review of Glycemic Control  Results for Dylan Sanders, Dylan Sanders (MRN [id number]) as of 09/20/2016 09:13  Ref. Range 09/19/2016 06:29 09/19/2016 10:42 09/19/2016 16:29 09/19/2016 21:48 09/20/2016 07:29  Glucose-Capillary Latest Ref Range: 65 - 99 mg/dL 95 202 (H) 208 (H) 222 (H) 157 (H)    Diabetes history: Type 2 Outpatient Diabetes medications: Lantus 15 units qday, Glucotrol 5mg /day Current orders for Inpatient glycemic control: Lantus 15 units qday, Glucotrol 5mg /day  Inpatient Diabetes Program Recommendations:    If the discharge is delayed and patient does not go home today, please consider d/c Glipizide while patient is in the hospital and add Novolog 0-9 units tid, Novolog 0-5 units qhs  Dylan Fitz, RN, [place], [place], CDE Diabetes Coordinator Inpatient Diabetes Program  [phone] (Team Pager) [phone] ([place]) 09/20/2016 9:17 AM

## 2016-09-20 NOTE — Care Management Note (Signed)
Case Management Note  Patient Details  Name: Dylan Sanders MRN: [id number] Date of Birth: July 24, 1931  Subjective/Objective:  Discharging today.                   Action/Plan: [facility] notified of discharge. Will need PT/OT  Expected Discharge Date:  09/20/16               Expected Discharge Plan:  [place]  In-House Referral:     Discharge planning Services  CM Consult  Post Acute Care Choice:  Home Health Choice offered to:  Patient  DME Arranged:    DME Agency:     HH Arranged:  PT, OT [place] Agency:  [facility] (now [facility])  Status of Service:  Completed, signed off  If discussed at [place] of Stay Meetings, dates discussed:    Additional Comments:  Jolly Mango, RN 09/20/2016, 9:20 AM

## 2016-09-20 NOTE — Discharge Instructions (Signed)
Diet: As you were doing prior to hospitalization   Shower:  May shower but keep the wounds dry starting 3 days after surgery., use an occlusive plastic wrap, NO SOAKING IN TUB.  If the bandage gets wet, change with a clean dry gauze.  Dressing:  You may change your dressing as needed. Change the dressing with sterile gauze dressing.    Activity:  Increase activity slowly as tolerated, but follow the weight bearing instructions below.  No lifting or driving for 6 weeks.  Weight Bearing:   Non-weightbearing to the left arm.  Blood Clot Prevention: Instead of taking 1 81mg  aspirin per day, increase to 2 81mg  aspirin daily for the next 14 days.  Pain Control: Oxycodone as prescribed, can supplement with Tylenol (maximum of 4,000mg  daily)  To prevent constipation: you may use a stool softener such as -  Colace (over the counter) 100 mg by mouth twice a day  Drink plenty of fluids (prune juice may be helpful) and high fiber foods Miralax (over the counter) for constipation as needed.    Itching:  If you experience itching with your medications, try taking only a single pain pill, or even half a pain pill at a time.  You may take up to 10 pain pills per day, and you can also use benadryl over the counter for itching or also to help with sleep.   Precautions:  If you experience chest pain or shortness of breath - call 911 immediately for transfer to the hospital emergency department!!  If you develop a fever greater that 101 F, purulent drainage from wound, increased redness or drainage from wound, or calf pain-Call [place]                                               Follow- Up Appointment:  Please call for an appointment to be seen in 2 weeks at [facility]

## 2016-09-20 NOTE — Anesthesia Postprocedure Evaluation (Signed)
Anesthesia Post Note  Patient: Pressley Tadesse Newburn  Procedure(s) Performed: Procedure(s) (LRB): REVERSE SHOULDER ARTHROPLASTY (Left)  Patient location during evaluation: PACU Anesthesia Type: General Level of consciousness: awake and alert Pain management: pain level controlled Vital Signs Assessment: post-procedure vital signs reviewed and stable Respiratory status: spontaneous breathing, nonlabored ventilation, respiratory function stable and patient connected to nasal cannula oxygen Cardiovascular status: blood pressure returned to baseline and stable Postop Assessment: no signs of nausea or vomiting Anesthetic complications: no     Last Vitals:  Vitals:   09/19/16 1928 09/20/16 0731  BP: (!) 153/61 (!) 164/58  Pulse: 66 60  Resp: 18 17  Temp: 36.1 C 36.3 C    Last Pain:  Vitals:   09/20/16 0731  TempSrc: Oral  PainSc:                  Molli Barrows

## 2016-09-20 NOTE — Progress Notes (Signed)
  Subjective: 1 Day Post-Op Procedure(s) (LRB): REVERSE SHOULDER ARTHROPLASTY (Left) Patient reports pain as mild.   Patient is well, and has had no acute complaints or problems Plan is to go Home after hospital stay. Negative for chest pain and shortness of breath Fever: no Gastrointestinal:Negative for nausea and vomiting  Objective: Vital signs in last 24 hours: Temp:  [97 F (36.1 C)-97.6 F (36.4 C)] 97.4 F (36.3 C) (05/11 0731) Pulse Rate:  [48-66] 60 (05/11 0731) Resp:  [13-21] 17 (05/11 0731) BP: (101-164)/(46-62) 164/58 (05/11 0731) SpO2:  [93 %-97 %] 93 % (05/11 0731)  Intake/Output from previous day:  Intake/Output Summary (Last 24 hours) at 09/20/16 0737 Last data filed at 09/20/16 0545  Gross per 24 hour  Intake          2108.75 ml  Output             1550 ml  Net           558.75 ml    Intake/Output this shift: No intake/output data recorded.  Labs:  Recent Labs  09/19/16 0636 09/20/16 0438  HGB 11.9* 11.4*    Recent Labs  09/19/16 0636 09/20/16 0438  WBC  --  14.1*  RBC  --  3.48*  HCT 35.0* 33.6*  PLT  --  167    Recent Labs  09/19/16 0636 09/20/16 0438  NA 142 137  K 4.2 5.0  CL  --  110  CO2  --  20*  BUN  --  49*  CREATININE  --  2.96*  GLUCOSE 98 185*  CALCIUM  --  8.4*   No results for input(s): LABPT, INR in the last 72 hours.   EXAM General - Patient is Alert, Appropriate and Oriented Extremity - Neurovascular intact Incision: dressing C/D/I No cellulitis present  Patient is intact to light touch to the left upper extremity.  Dressing/Incision - clean, dry, no drainage Motor Function - intact, moving foot and toes well on exam.  Able to flex and extend wrist on command.  Abdomen slightly distended with mild tympany, with normal BS.  Past Medical History:  Diagnosis Date  . Cancer ([employer])    colon  . Chronic kidney disease   . Diabetes mellitus without complication ([place])   . Hypertension      Assessment/Plan: 1 Day Post-Op Procedure(s) (LRB): REVERSE SHOULDER ARTHROPLASTY (Left) Active Problems:   Status post reverse total shoulder replacement, left  Estimated body mass index is 25.53 kg/m as calculated from the following:   Height as of this encounter: 5\' 7"  (1.702 m).   Weight as of this encounter: 73.9 kg (163 lb). Advance diet Up with therapy D/C IV fluids when tolerating po intake.  Labs reviewed, WBC 14.1, no fevers to tachycardia. Kidney function around baseline for the patient, history of CKD. Pt is urinating well, has not had a BM, denies passing gas.  Continue stool softeners, up with therapy to stimulate bowels. Plan will be up with therapy today, discharge home this afternoon.  DVT Prophylaxis - Lovenox, Foot Pumps and TED hose Non-weightbearing to the left arm.  Raquel James, PA-C [facility] Orthopaedic Surgery 09/20/2016, 7:37 AM

## 2016-09-20 NOTE — Discharge Summary (Signed)
Physician Discharge Summary  Patient ID: Dylan Sanders MRN: [id number] DOB/AGE: July 11, 1931 83 y.o.  Admit date: 09/19/2016 Discharge date: 09/20/2016  Admission Diagnoses:  primary osteoarthritis of left shoulder,complete tearof left rotator cuff Massive irreparable rotator cuff tear with cuff arthropathy of the left shoulder.  Discharge Diagnoses: Patient Active Problem List   Diagnosis Date Noted  . Status post reverse total shoulder replacement, left 09/19/2016  . Chronic renal impairment, stage 4 (severe) ([place]) 05/02/2016  . Renal artery stenosis ([place]) 05/02/2016  . Essential hypertension 05/02/2016  . Diabetes ([place]) 05/02/2016  . Hyperlipidemia 05/02/2016  Massive irreparable rotator cuff tear with cuff arthropathy of the left shoulder.   Past Medical History:  Diagnosis Date  . Cancer ([place])    colon  . Chronic kidney disease   . Diabetes mellitus without complication ([place])   . Hypertension      Transfusion: None.   Consultants (if any):   Discharged Condition: Improved  Hospital Course: Dylan Sanders is an 83 y.o. male who was admitted 09/19/2016 with a diagnosis of massive irreparable rotator cuff tear with cuff arthropathy of the left shoulder and went to the operating room on 09/19/2016 and underwent the above named procedures.    Surgeries: Procedure(s): REVERSE SHOULDER ARTHROPLASTY on 09/19/2016 Patient tolerated the surgery well. Taken to PACU where she was stabilized and then transferred to the orthopedic floor.  Started on Lovenox 68m q 24 hrs. Foot pumps applied bilaterally at 80 mm. Heels elevated on bed with rolled towels. No evidence of DVT. Negative [place]. Physical therapy started on day #1 for gait training and transfer. OT started day #1 for ADL and assisted devices.  Patient's IV was removed on POD1.  Implants: All press-fit [employer] Comprehensive system with a #13 mini-humeral stem, a 44 mm humeral tray with a standard insert, and a mini-base  plate with a 36 mm glenosphere.  He was given perioperative antibiotics:  Anti-infectives    Start     Dose/Rate Route Frequency Ordered Stop   09/19/16 1400  ceFAZolin (ANCEF) IVPB 2g/100 mL premix  Status:  Discontinued     2 g 200 mL/hr over 30 Minutes Intravenous Every 6 hours 09/19/16 1131 09/19/16 1152   09/19/16 1400  ceFAZolin (ANCEF) 2 g in dextrose 5 % 100 mL IVPB     2 g 200 mL/hr over 30 Minutes Intravenous Every 6 hours 09/19/16 1152 09/20/16 0448   09/19/16 0603  ceFAZolin (ANCEF) 2-4 GM/100ML-% IVPB    Comments:  register, karen: cabinet override      09/19/16 0603 09/19/16 1814   09/19/16 0030  ceFAZolin (ANCEF) IVPB 2g/100 mL premix     2 g 200 mL/hr over 30 Minutes Intravenous  Once 09/19/16 0030 09/19/16 0820    .  He was given sequential compression devices, early ambulation, and Lovenox for DVT prophylaxis.  He benefited maximally from the hospital stay and there were no complications.    Recent vital signs:  Vitals:   09/19/16 1928 09/20/16 0731  BP: (!) 153/61 (!) 164/58  Pulse: 66 60  Resp: 18 17  Temp: 97 F (36.1 C) 97.4 F (36.3 C)    Recent laboratory studies:  Lab Results  Component Value Date   HGB 11.4 (L) 09/20/2016   HGB 11.9 (L) 09/19/2016   HGB 12.5 (L) 09/05/2016   Lab Results  Component Value Date   WBC 14.1 (H) 09/20/2016   PLT 167 09/20/2016   Lab Results  Component Value Date   INR  1.08 09/05/2016   Lab Results  Component Value Date   NA 137 09/20/2016   K 5.0 09/20/2016   CL 110 09/20/2016   CO2 20 (L) 09/20/2016   BUN 49 (H) 09/20/2016   CREATININE 2.96 (H) 09/20/2016   GLUCOSE 185 (H) 09/20/2016    Discharge Medications:   Allergies as of 09/20/2016   No Known Allergies     Medication List    TAKE these medications   allopurinol 100 MG tablet Commonly known as:  ZYLOPRIM Take 100 mg by mouth daily.   amLODipine 5 MG tablet Commonly known as:  NORVASC Take 5 mg by mouth daily.   aspirin EC 81 MG  tablet Take 81 mg by mouth at bedtime.   furosemide 20 MG tablet Commonly known as:  LASIX Take 20 mg by mouth daily.   glipiZIDE 5 MG 24 hr tablet Commonly known as:  GLUCOTROL XL Take 5 mg by mouth daily.   LANTUS SOLOSTAR 100 UNIT/ML Solostar Pen Generic drug:  Insulin Glargine Inject 15 Units into the skin at bedtime.   losartan 100 MG tablet Commonly known as:  COZAAR Take 100 mg by mouth daily.   ONE TOUCH ULTRA 2 w/Device Kit USE TO CHECK SUGARS TWICE DAILY   oxyCODONE 5 MG immediate release tablet Commonly known as:  Oxy IR/ROXICODONE Take 1-2 tablets (5-10 mg total) by mouth every 4 (four) hours as needed for breakthrough pain.   simvastatin 20 MG tablet Commonly known as:  ZOCOR Take 20 mg by mouth daily.   verapamil 240 MG CR tablet Commonly known as:  CALAN-SR Take 240 mg by mouth daily.       Diagnostic Studies: Dg Shoulder Left Port  Result Date: 09/19/2016 CLINICAL DATA:  Left shoulder arthroplasty EXAM: LEFT SHOULDER - 1 VIEW COMPARISON:  None. FINDINGS: Postoperative change from left shoulder focal arthroplasty. The hardware components are in anatomic alignment and no complicating features identified. IMPRESSION: 1. No complications status post left total shoulder arthroplasty. Electronically Signed   By: Kerby Moors M.D.   On: 09/19/2016 11:44   Disposition: Plan will be for discharge home today pending on: Performance with PT, passing gas without difficulty and pain remains under control.  Follow-up Information    Lattie Corns, PA-C Follow up in 14 day(s).   Specialty:  Physician Assistant Why:  Electa Sniff information: [facility] [place] [id number] [phone]          Signed: Judson Roch PA-C 09/20/2016, 7:45 AM

## 2016-09-21 DIAGNOSIS — E785 Hyperlipidemia, unspecified: Secondary | ICD-10-CM | POA: Diagnosis not present

## 2016-09-21 DIAGNOSIS — E119 Type 2 diabetes mellitus without complications: Secondary | ICD-10-CM | POA: Diagnosis not present

## 2016-09-21 DIAGNOSIS — Z471 Aftercare following joint replacement surgery: Secondary | ICD-10-CM | POA: Diagnosis not present

## 2016-09-21 DIAGNOSIS — I701 Atherosclerosis of renal artery: Secondary | ICD-10-CM | POA: Diagnosis not present

## 2016-09-21 DIAGNOSIS — Z79891 Long term (current) use of opiate analgesic: Secondary | ICD-10-CM | POA: Diagnosis not present

## 2016-09-21 DIAGNOSIS — Z96612 Presence of left artificial shoulder joint: Secondary | ICD-10-CM | POA: Diagnosis not present

## 2016-09-21 DIAGNOSIS — Z794 Long term (current) use of insulin: Secondary | ICD-10-CM | POA: Diagnosis not present

## 2016-09-21 DIAGNOSIS — I129 Hypertensive chronic kidney disease with stage 1 through stage 4 chronic kidney disease, or unspecified chronic kidney disease: Secondary | ICD-10-CM | POA: Diagnosis not present

## 2016-09-21 DIAGNOSIS — Z85038 Personal history of other malignant neoplasm of large intestine: Secondary | ICD-10-CM | POA: Diagnosis not present

## 2016-09-21 DIAGNOSIS — N184 Chronic kidney disease, stage 4 (severe): Secondary | ICD-10-CM | POA: Diagnosis not present

## 2016-09-23 LAB — SURGICAL PATHOLOGY

## 2016-09-30 DIAGNOSIS — E119 Type 2 diabetes mellitus without complications: Secondary | ICD-10-CM | POA: Diagnosis not present

## 2016-09-30 DIAGNOSIS — N184 Chronic kidney disease, stage 4 (severe): Secondary | ICD-10-CM | POA: Diagnosis not present

## 2016-09-30 DIAGNOSIS — E785 Hyperlipidemia, unspecified: Secondary | ICD-10-CM | POA: Diagnosis not present

## 2016-09-30 DIAGNOSIS — I701 Atherosclerosis of renal artery: Secondary | ICD-10-CM | POA: Diagnosis not present

## 2016-09-30 DIAGNOSIS — Z794 Long term (current) use of insulin: Secondary | ICD-10-CM | POA: Diagnosis not present

## 2016-09-30 DIAGNOSIS — I129 Hypertensive chronic kidney disease with stage 1 through stage 4 chronic kidney disease, or unspecified chronic kidney disease: Secondary | ICD-10-CM | POA: Diagnosis not present

## 2016-09-30 DIAGNOSIS — Z85038 Personal history of other malignant neoplasm of large intestine: Secondary | ICD-10-CM | POA: Diagnosis not present

## 2016-09-30 DIAGNOSIS — Z96612 Presence of left artificial shoulder joint: Secondary | ICD-10-CM | POA: Diagnosis not present

## 2016-09-30 DIAGNOSIS — Z471 Aftercare following joint replacement surgery: Secondary | ICD-10-CM | POA: Diagnosis not present

## 2016-09-30 DIAGNOSIS — Z79891 Long term (current) use of opiate analgesic: Secondary | ICD-10-CM | POA: Diagnosis not present

## 2016-10-01 DIAGNOSIS — Z794 Long term (current) use of insulin: Secondary | ICD-10-CM | POA: Diagnosis not present

## 2016-10-01 DIAGNOSIS — Z96612 Presence of left artificial shoulder joint: Secondary | ICD-10-CM | POA: Diagnosis not present

## 2016-10-01 DIAGNOSIS — I701 Atherosclerosis of renal artery: Secondary | ICD-10-CM | POA: Diagnosis not present

## 2016-10-01 DIAGNOSIS — Z471 Aftercare following joint replacement surgery: Secondary | ICD-10-CM | POA: Diagnosis not present

## 2016-10-01 DIAGNOSIS — Z85038 Personal history of other malignant neoplasm of large intestine: Secondary | ICD-10-CM | POA: Diagnosis not present

## 2016-10-01 DIAGNOSIS — I129 Hypertensive chronic kidney disease with stage 1 through stage 4 chronic kidney disease, or unspecified chronic kidney disease: Secondary | ICD-10-CM | POA: Diagnosis not present

## 2016-10-01 DIAGNOSIS — E119 Type 2 diabetes mellitus without complications: Secondary | ICD-10-CM | POA: Diagnosis not present

## 2016-10-01 DIAGNOSIS — Z79891 Long term (current) use of opiate analgesic: Secondary | ICD-10-CM | POA: Diagnosis not present

## 2016-10-01 DIAGNOSIS — N184 Chronic kidney disease, stage 4 (severe): Secondary | ICD-10-CM | POA: Diagnosis not present

## 2016-10-01 DIAGNOSIS — E785 Hyperlipidemia, unspecified: Secondary | ICD-10-CM | POA: Diagnosis not present

## 2016-10-04 DIAGNOSIS — Z96612 Presence of left artificial shoulder joint: Secondary | ICD-10-CM | POA: Diagnosis not present

## 2016-10-04 DIAGNOSIS — M6281 Muscle weakness (generalized): Secondary | ICD-10-CM | POA: Diagnosis not present

## 2016-10-04 DIAGNOSIS — M25612 Stiffness of left shoulder, not elsewhere classified: Secondary | ICD-10-CM | POA: Diagnosis not present

## 2016-10-04 DIAGNOSIS — M25512 Pain in left shoulder: Secondary | ICD-10-CM | POA: Diagnosis not present

## 2016-10-09 DIAGNOSIS — Z96612 Presence of left artificial shoulder joint: Secondary | ICD-10-CM | POA: Diagnosis not present

## 2016-10-09 DIAGNOSIS — M25612 Stiffness of left shoulder, not elsewhere classified: Secondary | ICD-10-CM | POA: Diagnosis not present

## 2016-10-16 DIAGNOSIS — Z96612 Presence of left artificial shoulder joint: Secondary | ICD-10-CM | POA: Diagnosis not present

## 2016-10-23 DIAGNOSIS — E118 Type 2 diabetes mellitus with unspecified complications: Secondary | ICD-10-CM | POA: Diagnosis not present

## 2016-10-23 DIAGNOSIS — N184 Chronic kidney disease, stage 4 (severe): Secondary | ICD-10-CM | POA: Diagnosis not present

## 2016-10-23 DIAGNOSIS — Z0000 Encounter for general adult medical examination without abnormal findings: Secondary | ICD-10-CM | POA: Diagnosis not present

## 2016-10-23 DIAGNOSIS — Z1329 Encounter for screening for other suspected endocrine disorder: Secondary | ICD-10-CM | POA: Diagnosis not present

## 2016-10-23 DIAGNOSIS — Z79899 Other long term (current) drug therapy: Secondary | ICD-10-CM | POA: Diagnosis not present

## 2016-10-23 DIAGNOSIS — E7800 Pure hypercholesterolemia, unspecified: Secondary | ICD-10-CM | POA: Diagnosis not present

## 2016-10-23 DIAGNOSIS — Z96612 Presence of left artificial shoulder joint: Secondary | ICD-10-CM | POA: Diagnosis not present

## 2016-10-23 DIAGNOSIS — I10 Essential (primary) hypertension: Secondary | ICD-10-CM | POA: Diagnosis not present

## 2016-10-30 DIAGNOSIS — Z79899 Other long term (current) drug therapy: Secondary | ICD-10-CM | POA: Diagnosis not present

## 2016-10-30 DIAGNOSIS — M6281 Muscle weakness (generalized): Secondary | ICD-10-CM | POA: Diagnosis not present

## 2016-10-30 DIAGNOSIS — Z794 Long term (current) use of insulin: Secondary | ICD-10-CM | POA: Diagnosis not present

## 2016-10-30 DIAGNOSIS — Z1329 Encounter for screening for other suspected endocrine disorder: Secondary | ICD-10-CM | POA: Diagnosis not present

## 2016-10-30 DIAGNOSIS — M25512 Pain in left shoulder: Secondary | ICD-10-CM | POA: Diagnosis not present

## 2016-10-30 DIAGNOSIS — E7800 Pure hypercholesterolemia, unspecified: Secondary | ICD-10-CM | POA: Diagnosis not present

## 2016-10-30 DIAGNOSIS — N184 Chronic kidney disease, stage 4 (severe): Secondary | ICD-10-CM | POA: Diagnosis not present

## 2016-10-30 DIAGNOSIS — Z96612 Presence of left artificial shoulder joint: Secondary | ICD-10-CM | POA: Diagnosis not present

## 2016-10-30 DIAGNOSIS — I10 Essential (primary) hypertension: Secondary | ICD-10-CM | POA: Diagnosis not present

## 2016-10-30 DIAGNOSIS — E1122 Type 2 diabetes mellitus with diabetic chronic kidney disease: Secondary | ICD-10-CM | POA: Diagnosis not present

## 2016-10-30 DIAGNOSIS — M25612 Stiffness of left shoulder, not elsewhere classified: Secondary | ICD-10-CM | POA: Diagnosis not present

## 2016-11-01 DIAGNOSIS — Z96612 Presence of left artificial shoulder joint: Secondary | ICD-10-CM | POA: Diagnosis not present

## 2016-11-05 ENCOUNTER — Encounter (INDEPENDENT_AMBULATORY_CARE_PROVIDER_SITE_OTHER): Payer: Self-pay | Admitting: Vascular Surgery

## 2016-11-05 ENCOUNTER — Ambulatory Visit (INDEPENDENT_AMBULATORY_CARE_PROVIDER_SITE_OTHER): Payer: PPO

## 2016-11-05 ENCOUNTER — Ambulatory Visit (INDEPENDENT_AMBULATORY_CARE_PROVIDER_SITE_OTHER): Payer: PPO | Admitting: Vascular Surgery

## 2016-11-05 ENCOUNTER — Encounter (INDEPENDENT_AMBULATORY_CARE_PROVIDER_SITE_OTHER): Payer: Self-pay

## 2016-11-05 VITALS — BP 187/82 | HR 73 | Resp 15 | Ht 68.0 in | Wt 154.0 lb

## 2016-11-05 DIAGNOSIS — D631 Anemia in chronic kidney disease: Secondary | ICD-10-CM | POA: Diagnosis not present

## 2016-11-05 DIAGNOSIS — N184 Chronic kidney disease, stage 4 (severe): Secondary | ICD-10-CM

## 2016-11-05 DIAGNOSIS — Z794 Long term (current) use of insulin: Secondary | ICD-10-CM | POA: Diagnosis not present

## 2016-11-05 DIAGNOSIS — I129 Hypertensive chronic kidney disease with stage 1 through stage 4 chronic kidney disease, or unspecified chronic kidney disease: Secondary | ICD-10-CM | POA: Diagnosis not present

## 2016-11-05 DIAGNOSIS — I10 Essential (primary) hypertension: Secondary | ICD-10-CM | POA: Diagnosis not present

## 2016-11-05 DIAGNOSIS — E1122 Type 2 diabetes mellitus with diabetic chronic kidney disease: Secondary | ICD-10-CM | POA: Diagnosis not present

## 2016-11-05 DIAGNOSIS — M109 Gout, unspecified: Secondary | ICD-10-CM | POA: Diagnosis not present

## 2016-11-05 DIAGNOSIS — E782 Mixed hyperlipidemia: Secondary | ICD-10-CM

## 2016-11-05 DIAGNOSIS — I701 Atherosclerosis of renal artery: Secondary | ICD-10-CM

## 2016-11-05 DIAGNOSIS — R809 Proteinuria, unspecified: Secondary | ICD-10-CM | POA: Diagnosis not present

## 2016-11-05 NOTE — Progress Notes (Signed)
Subjective:    Patient ID: Dylan Sanders, male    DOB: 1932-03-05, 83 y.o.   MRN: [id number] Chief Complaint  Patient presents with  . Re-evaluation    6 month Renal   Patient presents for a 6 month renal artery stenosis follow-up. He presents without complaint. Patient underwent a renal artery duplex exam which was notable for patent renal veins bilaterally, patent bilateral renal arteries with increased greater than 60% left renal artery stenosis and 1-59% right renal artery stenosis. Patient's blood pressure today was 187/82. Patient's most recent creatinine on 09/20/2016 was 2.9. Patient states that when he takes his blood pressure at home his systolic pressures are usually between 130 and 140. Patient is currently on 3 antihypertensive medications. He denies any fever, nausea or vomiting.   Review of Systems  Constitutional: Negative.   HENT: Negative.   Eyes: Negative.   Respiratory: Negative.   Cardiovascular: Negative.   Gastrointestinal: Negative.   Endocrine: Negative.   Genitourinary: Negative.        Chronic kidney disease  Musculoskeletal: Negative.   Skin: Negative.   Allergic/Immunologic: Negative.   Neurological: Negative.   Hematological: Negative.   Psychiatric/Behavioral: Negative.       Objective:   Physical Exam  Constitutional: He is oriented to person, place, and time. He appears well-developed and well-nourished. No distress.  HENT:  Head: Normocephalic and atraumatic.  Eyes: Conjunctivae are normal. Pupils are equal, round, and reactive to light.  Neck: Normal range of motion.  Cardiovascular: Normal rate, regular rhythm, normal heart sounds and intact distal pulses.   Pulses:      Radial pulses are 2+ on the right side, and 2+ on the left side.  Pulmonary/Chest: Effort normal.  Abdominal: Soft.  Musculoskeletal: Normal range of motion. He exhibits no edema.  Neurological: He is alert and oriented to person, place, and time.  Skin: Skin is warm  and dry. He is not diaphoretic.  Psychiatric: He has a normal mood and affect. His behavior is normal. Judgment and thought content normal.  Vitals reviewed.  BP (!) 187/82 (BP Location: Right Arm)   Pulse 73   Resp 15   Ht 5' 8"  (1.727 m)   Wt 154 lb (69.9 kg)   BMI 23.42 kg/m   Past Medical History:  Diagnosis Date  . Cancer ([place])    colon  . Chronic kidney disease   . Diabetes mellitus without complication ([place])   . Hypertension    Social History   Social History  . Marital status: Widowed    Spouse name: N/A  . Number of children: N/A  . Years of education: N/A   Occupational History  . Not on file.   Social History Main Topics  . Smoking status: Former Smoker    Quit date: 09/05/1976  . Smokeless tobacco: Never Used  . Alcohol use Yes     Comment: ocassionally  . Drug use: No  . Sexual activity: Not on file   Other Topics Concern  . Not on file   Social History Narrative  . No narrative on file   Past Surgical History:  Procedure Laterality Date  . COLON SURGERY    . EYE SURGERY     bilateral  . REVERSE SHOULDER ARTHROPLASTY Left 09/19/2016   Procedure: REVERSE SHOULDER ARTHROPLASTY;  Surgeon: Corky Mull, MD;  Location: [facility] ORS;  Service: Orthopedics;  Laterality: Left;  . ROTATOR CUFF REPAIR Right    Family History  Problem Relation Age  of Onset  . Cancer Father    No Known Allergies     Assessment & Plan:  Patient presents for a 6 month renal artery stenosis follow-up. He presents without complaint. Patient underwent a renal artery duplex exam which was notable for patent renal veins bilaterally, patent bilateral renal arteries with increased greater than 60% left renal artery stenosis and 1-59% right renal artery stenosis. Patient's blood pressure today was 187/82. Patient's most recent creatinine on 09/20/2016 was 2.9. Patient states that when he takes his blood pressure at home his systolic pressures are usually between 130 and 140. Patient  is currently on 3 antihypertensive medications. He denies any fever, nausea or vomiting.  1. Renal artery stenosis (HCC) - worsening Duplex today with greater then 60% left renal artery stenosis When compared to the previous exam conducted on 04/2016 there has been an increase in the left renal artery stenosis Patient with poorly controlled hypertension on 3 antihypertensives Patient with known history of chronic kidney disease, most recent creatinine 2.9 (09/2016) Recommend a renal artery angiogram with possible intervention in an effort to revascularize the renal artery Procedure, risks and benefits explained to the patient All questions answered Patient has an appointment with his nephrologist this afternoon. He will discuss this with him and call the office if he would like to move forward The patient is aware of the risks if he were not to move forward with the procedure (worsening kidney function, possible need for dialysis).  2. Essential hypertension - stable Encouraged good control as its slows the progression of atherosclerotic and renal disease  3. Type 2 diabetes mellitus with stage 4 chronic kidney disease, with long-term current use of insulin (HCC) - stable Encouraged good control as its slows the progression of atherosclerotic and renal disease  4. Mixed hyperlipidemia - stable Encouraged good control as its slows the progression of atherosclerotic and renal disease  Current Outpatient Prescriptions on File Prior to Visit  Medication Sig Dispense Refill  . allopurinol (ZYLOPRIM) 100 MG tablet Take 100 mg by mouth daily.     Marland Kitchen amLODipine (NORVASC) 5 MG tablet Take 5 mg by mouth daily.    Marland Kitchen aspirin EC 81 MG tablet Take 81 mg by mouth at bedtime.     . Blood Glucose Monitoring Suppl (ONE TOUCH ULTRA 2) w/Device KIT USE TO CHECK SUGARS TWICE DAILY    . furosemide (LASIX) 20 MG tablet Take 20 mg by mouth daily.     Marland Kitchen glipiZIDE (GLUCOTROL XL) 5 MG 24 hr tablet Take 5 mg by mouth  daily.     Marland Kitchen LANTUS SOLOSTAR 100 UNIT/ML Solostar Pen Inject 15 Units into the skin at bedtime.     Marland Kitchen losartan (COZAAR) 100 MG tablet Take 100 mg by mouth daily.     Marland Kitchen oxyCODONE (OXY IR/ROXICODONE) 5 MG immediate release tablet Take 1-2 tablets (5-10 mg total) by mouth every 4 (four) hours as needed for breakthrough pain. 60 tablet 0  . simvastatin (ZOCOR) 20 MG tablet Take 20 mg by mouth daily.     . verapamil (CALAN-SR) 240 MG CR tablet Take 240 mg by mouth daily.     No current facility-administered medications on file prior to visit.     There are no Patient Instructions on file for this visit. No Follow-up on file.   Taviana Westergren A Jasmon Mattice, PA-C

## 2016-11-05 NOTE — Progress Notes (Unsigned)
Patient was seen earlier but wanted to talk with his nephrologist before scheduling his angiogram.

## 2016-11-06 DIAGNOSIS — I10 Essential (primary) hypertension: Secondary | ICD-10-CM | POA: Diagnosis not present

## 2016-11-06 DIAGNOSIS — Z96612 Presence of left artificial shoulder joint: Secondary | ICD-10-CM | POA: Diagnosis not present

## 2016-11-06 DIAGNOSIS — E1122 Type 2 diabetes mellitus with diabetic chronic kidney disease: Secondary | ICD-10-CM | POA: Diagnosis not present

## 2016-11-06 DIAGNOSIS — M25612 Stiffness of left shoulder, not elsewhere classified: Secondary | ICD-10-CM | POA: Diagnosis not present

## 2016-11-06 DIAGNOSIS — M25512 Pain in left shoulder: Secondary | ICD-10-CM | POA: Diagnosis not present

## 2016-11-06 DIAGNOSIS — N184 Chronic kidney disease, stage 4 (severe): Secondary | ICD-10-CM | POA: Diagnosis not present

## 2016-11-06 DIAGNOSIS — M6281 Muscle weakness (generalized): Secondary | ICD-10-CM | POA: Diagnosis not present

## 2016-11-06 DIAGNOSIS — Z794 Long term (current) use of insulin: Secondary | ICD-10-CM | POA: Diagnosis not present

## 2016-11-11 DIAGNOSIS — Z96612 Presence of left artificial shoulder joint: Secondary | ICD-10-CM | POA: Diagnosis not present

## 2016-11-11 DIAGNOSIS — M6281 Muscle weakness (generalized): Secondary | ICD-10-CM | POA: Diagnosis not present

## 2016-11-11 DIAGNOSIS — M25512 Pain in left shoulder: Secondary | ICD-10-CM | POA: Diagnosis not present

## 2016-11-11 DIAGNOSIS — M25612 Stiffness of left shoulder, not elsewhere classified: Secondary | ICD-10-CM | POA: Diagnosis not present

## 2016-11-15 ENCOUNTER — Other Ambulatory Visit (INDEPENDENT_AMBULATORY_CARE_PROVIDER_SITE_OTHER): Payer: Self-pay | Admitting: Vascular Surgery

## 2016-11-18 MED ORDER — CEFAZOLIN SODIUM-DEXTROSE 1-4 GM/50ML-% IV SOLN
1.0000 g | Freq: Once | INTRAVENOUS | Status: AC
  Administered 2016-11-19: 1 g via INTRAVENOUS

## 2016-11-19 ENCOUNTER — Encounter
Admission: RE | Disposition: A | Discharge status: Home / Self Care | Payer: Self-pay | Source: Ambulatory Visit | Attending: Vascular Surgery

## 2016-11-19 ENCOUNTER — Ambulatory Visit
Admission: RE | Admit: 2016-11-19 | Discharge: 2016-11-19 | Disposition: A | Discharge status: Home / Self Care | Payer: PPO | Source: Ambulatory Visit | Attending: Vascular Surgery | Admitting: Vascular Surgery

## 2016-11-19 DIAGNOSIS — E1122 Type 2 diabetes mellitus with diabetic chronic kidney disease: Secondary | ICD-10-CM | POA: Diagnosis not present

## 2016-11-19 DIAGNOSIS — Z85038 Personal history of other malignant neoplasm of large intestine: Secondary | ICD-10-CM | POA: Insufficient documentation

## 2016-11-19 DIAGNOSIS — Z794 Long term (current) use of insulin: Secondary | ICD-10-CM | POA: Insufficient documentation

## 2016-11-19 DIAGNOSIS — Z87891 Personal history of nicotine dependence: Secondary | ICD-10-CM | POA: Insufficient documentation

## 2016-11-19 DIAGNOSIS — N184 Chronic kidney disease, stage 4 (severe): Secondary | ICD-10-CM | POA: Diagnosis not present

## 2016-11-19 DIAGNOSIS — I10 Essential (primary) hypertension: Secondary | ICD-10-CM | POA: Diagnosis not present

## 2016-11-19 DIAGNOSIS — Z7982 Long term (current) use of aspirin: Secondary | ICD-10-CM | POA: Diagnosis not present

## 2016-11-19 DIAGNOSIS — I129 Hypertensive chronic kidney disease with stage 1 through stage 4 chronic kidney disease, or unspecified chronic kidney disease: Secondary | ICD-10-CM | POA: Diagnosis not present

## 2016-11-19 DIAGNOSIS — Z809 Family history of malignant neoplasm, unspecified: Secondary | ICD-10-CM | POA: Insufficient documentation

## 2016-11-19 DIAGNOSIS — E785 Hyperlipidemia, unspecified: Secondary | ICD-10-CM | POA: Insufficient documentation

## 2016-11-19 DIAGNOSIS — I701 Atherosclerosis of renal artery: Secondary | ICD-10-CM | POA: Insufficient documentation

## 2016-11-19 DIAGNOSIS — Z9889 Other specified postprocedural states: Secondary | ICD-10-CM | POA: Insufficient documentation

## 2016-11-19 HISTORY — PX: RENAL ANGIOGRAPHY: CATH118260

## 2016-11-19 LAB — CREATININE, SERUM
Creatinine, Ser: 2.56 mg/dL — ABNORMAL HIGH (ref 0.61–1.24)
GFR calc Af Amer: 25 mL/min — ABNORMAL LOW (ref >60)
GFR calc non Af Amer: 22 mL/min — ABNORMAL LOW (ref >60)

## 2016-11-19 LAB — BUN: BUN: 45 mg/dL — AB (ref 6–20)

## 2016-11-19 LAB — GLUCOSE, CAPILLARY: GLUCOSE-CAPILLARY: 95 mg/dL (ref 65–99)

## 2016-11-19 SURGERY — RENAL ANGIOGRAPHY
Anesthesia: Moderate Sedation | Laterality: Bilateral

## 2016-11-19 MED ORDER — ALUM & MAG HYDROXIDE-SIMETH 200-200-20 MG/5ML PO SUSP: 15.0000 mL | ORAL | Status: DC | PRN

## 2016-11-19 MED ORDER — SODIUM CHLORIDE 0.9 % IV SOLN
INTRAVENOUS | Status: DC
  Administered 2016-11-19: 07:00:00 via INTRAVENOUS

## 2016-11-19 MED ORDER — HYDROMORPHONE HCL 1 MG/ML IJ SOLN: 0.5000 mg | INTRAMUSCULAR | Status: DC | PRN

## 2016-11-19 MED ORDER — HEPARIN (PORCINE) IN NACL 2-0.9 UNIT/ML-% IJ SOLN
INTRAMUSCULAR | Status: AC
Start: 2016-11-19 — End: 2016-11-19
  Filled 2016-11-19: qty 1000

## 2016-11-19 MED ORDER — PHENOL 1.4 % MT LIQD: 1.0000 | OROMUCOSAL | Status: DC | PRN

## 2016-11-19 MED ORDER — IOPAMIDOL (ISOVUE-300) INJECTION 61%
INTRAVENOUS | Status: DC | PRN
  Administered 2016-11-19: 30 mL via INTRA_ARTERIAL

## 2016-11-19 MED ORDER — ACETAMINOPHEN 325 MG PO TABS: 325.0000 mg | ORAL_TABLET | ORAL | Status: DC | PRN

## 2016-11-19 MED ORDER — SODIUM CHLORIDE 0.9 % IV SOLN: 1.0000 mL/kg/h | INTRAVENOUS | Status: DC

## 2016-11-19 MED ORDER — CEFAZOLIN SODIUM-DEXTROSE 1-4 GM/50ML-% IV SOLN
INTRAVENOUS | Status: AC
  Filled 2016-11-19: qty 50

## 2016-11-19 MED ORDER — FENTANYL CITRATE (PF) 100 MCG/2ML IJ SOLN
INTRAMUSCULAR | Status: AC
  Filled 2016-11-19: qty 2

## 2016-11-19 MED ORDER — METHYLPREDNISOLONE SODIUM SUCC 125 MG IJ SOLR: 125.0000 mg | INTRAMUSCULAR | Status: DC | PRN

## 2016-11-19 MED ORDER — MIDAZOLAM HCL 5 MG/5ML IJ SOLN
INTRAMUSCULAR | Status: AC
  Filled 2016-11-19: qty 5

## 2016-11-19 MED ORDER — HEPARIN SODIUM (PORCINE) 1000 UNIT/ML IJ SOLN
INTRAMUSCULAR | Status: AC
  Filled 2016-11-19: qty 1

## 2016-11-19 MED ORDER — HEPARIN SODIUM (PORCINE) 1000 UNIT/ML IJ SOLN
INTRAMUSCULAR | Status: DC | PRN
  Administered 2016-11-19: 5000 [IU] via INTRAVENOUS

## 2016-11-19 MED ORDER — ONDANSETRON HCL 4 MG/2ML IJ SOLN: 4.0000 mg | Freq: Four times a day (QID) | INTRAMUSCULAR | Status: DC | PRN

## 2016-11-19 MED ORDER — GUAIFENESIN-DM 100-10 MG/5ML PO SYRP: 15.0000 mL | ORAL_SOLUTION | ORAL | Status: DC | PRN

## 2016-11-19 MED ORDER — HYDRALAZINE HCL 20 MG/ML IJ SOLN: 5.0000 mg | INTRAMUSCULAR | Status: DC | PRN

## 2016-11-19 MED ORDER — LIDOCAINE HCL (PF) 1 % IJ SOLN
INTRAMUSCULAR | Status: AC
  Filled 2016-11-19: qty 30

## 2016-11-19 MED ORDER — MIDAZOLAM HCL 2 MG/2ML IJ SOLN
INTRAMUSCULAR | Status: DC | PRN
  Administered 2016-11-19: 2 mg via INTRAVENOUS
  Administered 2016-11-19: 0.5 mg via INTRAVENOUS

## 2016-11-19 MED ORDER — SODIUM CHLORIDE 0.9 % IV SOLN: 500.0000 mL | Freq: Once | INTRAVENOUS | Status: DC | PRN

## 2016-11-19 MED ORDER — OXYCODONE HCL 5 MG PO TABS: 5.0000 mg | ORAL_TABLET | ORAL | Status: DC | PRN

## 2016-11-19 MED ORDER — METOPROLOL TARTRATE 5 MG/5ML IV SOLN: 2.0000 mg | INTRAVENOUS | Status: DC | PRN

## 2016-11-19 MED ORDER — FAMOTIDINE 20 MG PO TABS: 40.0000 mg | ORAL_TABLET | ORAL | Status: DC | PRN

## 2016-11-19 MED ORDER — HYDROMORPHONE HCL 1 MG/ML IJ SOLN: 1.0000 mg | Freq: Once | INTRAMUSCULAR | Status: DC | PRN

## 2016-11-19 MED ORDER — FENTANYL CITRATE (PF) 100 MCG/2ML IJ SOLN
INTRAMUSCULAR | Status: DC | PRN
  Administered 2016-11-19: 25 ug via INTRAVENOUS
  Administered 2016-11-19: 50 ug via INTRAVENOUS

## 2016-11-19 MED ORDER — ACETAMINOPHEN 325 MG RE SUPP
325.0000 mg | RECTAL | Status: DC | PRN
  Filled 2016-11-19: qty 2

## 2016-11-19 MED ORDER — LABETALOL HCL 5 MG/ML IV SOLN: 10.0000 mg | INTRAVENOUS | Status: DC | PRN

## 2016-11-19 SURGICAL SUPPLY — 9 items
CATH PIG 70CM (CATHETERS) ×3 IMPLANT
DEVICE CLOSURE MYNXGRIP 5F (Vascular Products) ×3 IMPLANT
DEVICE PRESTO INFLATION (MISCELLANEOUS) ×3 IMPLANT
NEEDLE ENTRY 21GA 7CM ECHOTIP (NEEDLE) ×3 IMPLANT
PACK ANGIOGRAPHY (CUSTOM PROCEDURE TRAY) ×3 IMPLANT
SET INTRO CAPELLA COAXIAL (SET/KITS/TRAYS/PACK) ×3 IMPLANT
SHEATH BRITE TIP 5FRX11 (SHEATH) ×3 IMPLANT
SHIELD RADPAD SCOOP 12X17 (MISCELLANEOUS) ×3 IMPLANT
WIRE J 3MM .035X145CM (WIRE) ×3 IMPLANT

## 2016-11-19 NOTE — H&P (Signed)
[facility] History & Physical Update  The patient was interviewed and re-examined.  The patient's previous History and Physical has been reviewed and is unchanged.  There is no change in the plan of care. We plan to proceed with the scheduled procedure.  Hortencia Pilar, MD  11/19/2016, 8:02 AM

## 2016-11-19 NOTE — Op Note (Signed)
[facility]  Percutaneous Study/Intervention Procedural Note   Date of Surgery: 11/19/2016,8:42 AM  Surgeon:Wreatha Sturgeon, Dolores Lory   Pre-operative Diagnosis: Malignant hypertension; renal artery stenosis  Post-operative diagnosis:  Malignant hypertension; 50% right renal artery stenosis with marked tortuosity; occlusion left renal artery  Procedure(s) Performed:  1.  Introduction catheter into aorta   Anesthesia: Conscious sedation was administered by the interventional radiology RN under my direct supervision. IV Versed plus fentanyl were utilized. Continuous ECG, pulse oximetry and blood pressure was monitored throughout the entire procedure. Conscious sedation was administered for a total of 18 minutes.  Sheath: 5 [place]  Contrast: 30 cc   Fluoroscopy Time: 1.0 minutes  Indications:  Patient has renal artery stenosis by duplex ultrasound associated with malignant poorly controlled hypertension. Renal artery intervention is indicated.  Procedure:  Alvey Brockel Justiceis a 83 y.o. male who was identified and appropriate procedural time out was performed.  The patient was then placed supine on the table and prepped and draped in the usual sterile fashion.  Ultrasound was used to evaluate the right common femoral artery.  It was echolucent and pulsatile indicating it is patent .  An ultrasound image was acquired for the permanent record.  A micropuncture needle was used to access the right common femoral artery under direct ultrasound guidance.  The microwire was then advanced under fluoroscopic guidance without difficulty followed by the micro-sheath.  A 0.035 J wire was advanced without resistance and a 5Fr sheath was placed.    Pigtail catheter was positioned at T12 AP and bilateral oblique projections of the visceral segment of the aorta were obtained. After review of these images catheters removed over wire oblique view of the right groin is obtained and a minx device is  deployed without difficulty.  Findings:   Aortogram:  Aorta is opacified with a bolus injection of contrast. There are no hemodynamically significant lesions noted within the aorta. The left renal artery is occluded and there is nonvisualization of the left kidney nephrogram. The right renal artery is imaged and several views it has a 50% ostial stenosis with marked tortuosity near the origin of the renal artery. Distal renal vasculature is otherwise normal nephrogram is normal. There appears to be a 50-60% ostial stenosis of the SMA and celiac appears widely patent in its visualized portions inferior mesenteric artery is patent and significantly larger than typically seen  Right Lower Extremity: Visualized portions of the right external iliac and the right common femoral origins of the SFA and profunda femoris are patent    Disposition: Patient was taken to the recovery room in stable condition having tolerated the procedure well.  Belenda Cruise Brnadon Eoff 11/19/2016,8:42 AM

## 2016-11-20 DIAGNOSIS — M25612 Stiffness of left shoulder, not elsewhere classified: Secondary | ICD-10-CM | POA: Diagnosis not present

## 2016-11-20 DIAGNOSIS — Z96612 Presence of left artificial shoulder joint: Secondary | ICD-10-CM | POA: Diagnosis not present

## 2016-11-20 DIAGNOSIS — M6281 Muscle weakness (generalized): Secondary | ICD-10-CM | POA: Diagnosis not present

## 2016-11-20 DIAGNOSIS — M25512 Pain in left shoulder: Secondary | ICD-10-CM | POA: Diagnosis not present

## 2016-11-22 DIAGNOSIS — M6281 Muscle weakness (generalized): Secondary | ICD-10-CM | POA: Diagnosis not present

## 2016-11-22 DIAGNOSIS — Z96612 Presence of left artificial shoulder joint: Secondary | ICD-10-CM | POA: Diagnosis not present

## 2016-11-22 DIAGNOSIS — M25512 Pain in left shoulder: Secondary | ICD-10-CM | POA: Diagnosis not present

## 2016-11-22 DIAGNOSIS — M25612 Stiffness of left shoulder, not elsewhere classified: Secondary | ICD-10-CM | POA: Diagnosis not present

## 2016-11-27 DIAGNOSIS — M6281 Muscle weakness (generalized): Secondary | ICD-10-CM | POA: Diagnosis not present

## 2016-11-27 DIAGNOSIS — Z96612 Presence of left artificial shoulder joint: Secondary | ICD-10-CM | POA: Diagnosis not present

## 2016-11-27 DIAGNOSIS — M25512 Pain in left shoulder: Secondary | ICD-10-CM | POA: Diagnosis not present

## 2016-11-27 DIAGNOSIS — M25612 Stiffness of left shoulder, not elsewhere classified: Secondary | ICD-10-CM | POA: Diagnosis not present

## 2016-11-29 DIAGNOSIS — Z96612 Presence of left artificial shoulder joint: Secondary | ICD-10-CM | POA: Diagnosis not present

## 2016-11-29 DIAGNOSIS — M6281 Muscle weakness (generalized): Secondary | ICD-10-CM | POA: Diagnosis not present

## 2016-11-29 DIAGNOSIS — M25512 Pain in left shoulder: Secondary | ICD-10-CM | POA: Diagnosis not present

## 2016-11-29 DIAGNOSIS — M25612 Stiffness of left shoulder, not elsewhere classified: Secondary | ICD-10-CM | POA: Diagnosis not present

## 2016-12-02 ENCOUNTER — Ambulatory Visit (INDEPENDENT_AMBULATORY_CARE_PROVIDER_SITE_OTHER): Payer: PPO | Admitting: Vascular Surgery

## 2016-12-02 ENCOUNTER — Encounter (INDEPENDENT_AMBULATORY_CARE_PROVIDER_SITE_OTHER): Payer: Self-pay | Admitting: Vascular Surgery

## 2016-12-02 VITALS — BP 186/78 | HR 76 | Resp 16 | Wt 158.0 lb

## 2016-12-02 DIAGNOSIS — I701 Atherosclerosis of renal artery: Secondary | ICD-10-CM

## 2016-12-02 DIAGNOSIS — I10 Essential (primary) hypertension: Secondary | ICD-10-CM

## 2016-12-02 DIAGNOSIS — E782 Mixed hyperlipidemia: Secondary | ICD-10-CM | POA: Diagnosis not present

## 2016-12-02 DIAGNOSIS — E1122 Type 2 diabetes mellitus with diabetic chronic kidney disease: Secondary | ICD-10-CM

## 2016-12-02 DIAGNOSIS — N184 Chronic kidney disease, stage 4 (severe): Secondary | ICD-10-CM | POA: Diagnosis not present

## 2016-12-02 DIAGNOSIS — Z794 Long term (current) use of insulin: Secondary | ICD-10-CM

## 2016-12-03 DIAGNOSIS — H35373 Puckering of macula, bilateral: Secondary | ICD-10-CM | POA: Diagnosis not present

## 2016-12-03 DIAGNOSIS — E119 Type 2 diabetes mellitus without complications: Secondary | ICD-10-CM | POA: Diagnosis not present

## 2016-12-03 NOTE — Progress Notes (Signed)
MRN : [id number]  Dylan Sanders is a 83 y.o. (12/02/1931) male who presents with chief complaint of  Chief Complaint  Patient presents with  . Routine Post Op  .  History of Present Illness: The patient returns to the office for followup and review status post renal angiogram without intervention.   Angiography of the renal arteries was performed 11/19/2016 at that time occlusion of the left renal artery was identified and a 50% narrowing of the right renal artery was noted. No intervention was performed.  There have been no significant changes to the patient's overall health care.  The patient denies amaurosis fugax or recent TIA symptoms. There are no recent neurological changes noted. The patient denies history of DVT, PE or superficial thrombophlebitis. The patient denies recent episodes of angina or shortness of breath.     Current Meds  Medication Sig  . acetaminophen (TYLENOL) 500 MG tablet Take 500 mg by mouth every 6 (six) hours as needed for mild pain.  Marland Kitchen allopurinol (ZYLOPRIM) 100 MG tablet Take 100 mg by mouth daily.   Marland Kitchen amLODipine (NORVASC) 5 MG tablet Take 5 mg by mouth daily.  Marland Kitchen aspirin EC 81 MG tablet Take 81 mg by mouth at bedtime.   . Blood Glucose Monitoring Suppl (ONE TOUCH ULTRA 2) w/Device KIT USE TO CHECK SUGARS TWICE DAILY  . furosemide (LASIX) 20 MG tablet Take 20 mg by mouth daily.   Marland Kitchen glipiZIDE (GLUCOTROL XL) 5 MG 24 hr tablet Take 5 mg by mouth daily.   Marland Kitchen LANTUS SOLOSTAR 100 UNIT/ML Solostar Pen Inject 15 Units into the skin at bedtime.   Marland Kitchen losartan (COZAAR) 100 MG tablet Take 100 mg by mouth daily.   . simvastatin (ZOCOR) 20 MG tablet Take 20 mg by mouth daily.   . verapamil (CALAN-SR) 240 MG CR tablet Take 240 mg by mouth daily.    Past Medical History:  Diagnosis Date  . Cancer ([place])    colon  . Chronic kidney disease   . Diabetes mellitus without complication ([place])   . Hypertension     Past Surgical History:  Procedure Laterality Date    . COLON SURGERY    . EYE SURGERY     bilateral  . RENAL ANGIOGRAPHY Bilateral 11/19/2016   Procedure: Renal Angiography;  Surgeon: Katha Cabal, MD;  Location: [facility];  Service: Cardiovascular;  Laterality: Bilateral;  . REVERSE SHOULDER ARTHROPLASTY Left 09/19/2016   Procedure: REVERSE SHOULDER ARTHROPLASTY;  Surgeon: Corky Mull, MD;  Location: [facility];  Service: Orthopedics;  Laterality: Left;  . ROTATOR CUFF REPAIR Right     Social History Social History  Substance Use Topics  . Smoking status: Former Smoker    Quit date: 09/05/1976  . Smokeless tobacco: Never Used  . Alcohol use Yes     Comment: ocassionally    Family History Family History  Problem Relation Age of Onset  . Cancer Father     No Known Allergies   REVIEW OF SYSTEMS (Negative unless checked)  Constitutional: [] Weight loss  [] Fever  [] Chills Cardiac: [] Chest pain   [] Chest pressure   [] Palpitations   [] Shortness of breath when laying flat   [] Shortness of breath with exertion. Vascular:  [] Pain in legs with walking   [] Pain in legs at rest  [] History of DVT   [] Phlebitis   [x] Swelling in legs   [] Varicose veins   [] Non-healing ulcers Pulmonary:   [] Uses home oxygen   [] Productive cough   []   Hemoptysis   [] Wheeze  [] COPD   [] Asthma Neurologic:  [] Dizziness   [] Seizures   [] History of stroke   [] History of TIA  [] Aphasia   [] Vissual changes   [] Weakness or numbness in arm   [] Weakness or numbness in leg Musculoskeletal:   [] Joint swelling   [] Joint pain   [] Low back pain Hematologic:  [] Easy bruising  [] Easy bleeding   [] Hypercoagulable state   [] Anemic Gastrointestinal:  [] Diarrhea   [] Vomiting  [] Gastroesophageal reflux/heartburn   [] Difficulty swallowing. Genitourinary:  [x] Chronic kidney disease   [] Difficult urination  [] Frequent urination   [] Blood in urine Skin:  [] Rashes   [] Ulcers  Psychological:  [] History of anxiety   []  History of major depression.  Physical  Examination  Vitals:   12/02/16 1349  BP: (!) 186/78  Pulse: 76  Resp: 16  Weight: 71.7 kg (158 lb)   Body mass index is 24.02 kg/m. Gen: WD/WN, NAD Head: [place]/AT, No temporalis wasting.  Ear/Nose/Throat: Hearing grossly intact, nares w/o erythema or drainage Eyes: PER, EOMI, sclera nonicteric.  Neck: Supple, no large masses.   Pulmonary:  Good air movement, no audible wheezing bilaterally, no use of accessory muscles.  Cardiac: RRR, no JVD Vascular: Groin site clean dry and intact Vessel Right Left  Radial Palpable Palpable  PT Palpable Palpable  DP Palpable Palpable  Gastrointestinal: Non-distended. No guarding/no peritoneal signs.  Musculoskeletal: M/S 5/5 throughout.  No deformity or atrophy.  Neurologic: CN 2-12 intact. Symmetrical.  Speech is fluent. Motor exam as listed above. Psychiatric: Judgment intact, Mood & affect appropriate for pt's clinical situation. Dermatologic: No rashes or ulcers noted.  No changes consistent with cellulitis. Lymph : No lichenification or skin changes of chronic lymphedema.  CBC Lab Results  Component Value Date   WBC 14.1 (H) 09/20/2016   HGB 11.4 (L) 09/20/2016   HCT 33.6 (L) 09/20/2016   MCV 96.4 09/20/2016   PLT 167 09/20/2016    BMET    Component Value Date/Time   NA 137 09/20/2016 0438   NA 141 06/19/2011 1343   K 5.0 09/20/2016 0438   K 4.1 06/19/2011 1343   CL 110 09/20/2016 0438   CL 102 06/19/2011 1343   CO2 20 (L) 09/20/2016 0438   CO2 32 06/19/2011 1343   GLUCOSE 185 (H) 09/20/2016 0438   GLUCOSE 193 (H) 06/19/2011 1343   BUN 45 (H) 11/19/2016 0714   BUN 26 (H) 06/19/2011 1343   CREATININE 2.56 (H) 11/19/2016 0714   CREATININE 2.43 (H) 06/19/2011 1343   CALCIUM 8.4 (L) 09/20/2016 0438   CALCIUM 8.9 06/19/2011 1343   GFRNONAA 22 (L) 11/19/2016 0714   GFRNONAA 28 (L) 06/19/2011 1343   GFRAA 25 (L) 11/19/2016 0714   GFRAA 33 (L) 06/19/2011 1343   Estimated Creatinine Clearance: 21.5 mL/min (A) (by C-G formula  based on SCr of 2.56 mg/dL (H)).  COAG Lab Results  Component Value Date   INR 1.08 09/05/2016    Radiology No results found.   Assessment/Plan 1. Renal artery stenosis (HCC)  Given patient's arterial disease optimal control of the patient's hypertension is important.  BP is acceptable today  The patient's vital signs and noninvasive studies support the renal artery stenosis is not significantly increased when compared to the previous study.  Given that the left renal artery is a total occlusion and that the right renal artery is only 50% stenosis no intervention is indicated at this time. We will continue to follow his renal artery stenosis with minimally invasive duplex  ultrasound. This was discussed in detail especially since following with serial CT scans would require contrast. Contrast will be avoided given his stage IV renal insufficiency.   The patient will continue the current antihypertensive medications, no changes at this time.  The primary medical service will continue aggressive antihypertensive therapy as per the AHA guidelines   - VAS US RENAL ARTERY DUPLEX; Future  2. Essential hypertension Continue antihypertensive medications as already ordered, these medications have been reviewed and there are no changes at this time.   3. Type 2 diabetes mellitus with stage 4 chronic kidney disease, with long-term current use of insulin (HCC) Continue hypoglycemic medications as already ordered, these medications have been reviewed and there are no changes at this time.  Hgb A1C to be monitored as already arranged by primary service   4. Chronic renal impairment, stage 4 (severe) (HCC) Continue antihypertensive medications as already ordered, these medications have been reviewed and there are no changes at this time.  Nephrotoxic medications will be avoided dehydration will be avoided.  The primary medical service will continue aggressive antihypertensive therapy as  per the AHA guidelines    5. Mixed hyperlipidemia Continue statin as ordered and reviewed, no changes at this time    Hortencia Pilar, MD  12/03/2016 9:49 AM

## 2016-12-04 DIAGNOSIS — M25512 Pain in left shoulder: Secondary | ICD-10-CM | POA: Diagnosis not present

## 2016-12-04 DIAGNOSIS — Z96612 Presence of left artificial shoulder joint: Secondary | ICD-10-CM | POA: Diagnosis not present

## 2016-12-04 DIAGNOSIS — M25612 Stiffness of left shoulder, not elsewhere classified: Secondary | ICD-10-CM | POA: Diagnosis not present

## 2016-12-20 DIAGNOSIS — Z96612 Presence of left artificial shoulder joint: Secondary | ICD-10-CM | POA: Diagnosis not present

## 2016-12-30 DIAGNOSIS — Z96612 Presence of left artificial shoulder joint: Secondary | ICD-10-CM | POA: Diagnosis not present

## 2016-12-30 DIAGNOSIS — M25612 Stiffness of left shoulder, not elsewhere classified: Secondary | ICD-10-CM | POA: Diagnosis not present

## 2017-01-01 DIAGNOSIS — Z96612 Presence of left artificial shoulder joint: Secondary | ICD-10-CM | POA: Diagnosis not present

## 2017-01-01 DIAGNOSIS — M25612 Stiffness of left shoulder, not elsewhere classified: Secondary | ICD-10-CM | POA: Diagnosis not present

## 2017-01-06 DIAGNOSIS — M25612 Stiffness of left shoulder, not elsewhere classified: Secondary | ICD-10-CM | POA: Diagnosis not present

## 2017-01-06 DIAGNOSIS — Z96612 Presence of left artificial shoulder joint: Secondary | ICD-10-CM | POA: Diagnosis not present

## 2017-01-06 DIAGNOSIS — M25512 Pain in left shoulder: Secondary | ICD-10-CM | POA: Diagnosis not present

## 2017-01-08 DIAGNOSIS — M6281 Muscle weakness (generalized): Secondary | ICD-10-CM | POA: Diagnosis not present

## 2017-01-08 DIAGNOSIS — M25512 Pain in left shoulder: Secondary | ICD-10-CM | POA: Diagnosis not present

## 2017-01-08 DIAGNOSIS — Z96612 Presence of left artificial shoulder joint: Secondary | ICD-10-CM | POA: Diagnosis not present

## 2017-01-08 DIAGNOSIS — M25612 Stiffness of left shoulder, not elsewhere classified: Secondary | ICD-10-CM | POA: Diagnosis not present

## 2017-01-15 DIAGNOSIS — M6281 Muscle weakness (generalized): Secondary | ICD-10-CM | POA: Diagnosis not present

## 2017-01-15 DIAGNOSIS — M25612 Stiffness of left shoulder, not elsewhere classified: Secondary | ICD-10-CM | POA: Diagnosis not present

## 2017-01-15 DIAGNOSIS — M25512 Pain in left shoulder: Secondary | ICD-10-CM | POA: Diagnosis not present

## 2017-01-15 DIAGNOSIS — Z96612 Presence of left artificial shoulder joint: Secondary | ICD-10-CM | POA: Diagnosis not present

## 2017-01-17 DIAGNOSIS — M25512 Pain in left shoulder: Secondary | ICD-10-CM | POA: Diagnosis not present

## 2017-01-17 DIAGNOSIS — Z96612 Presence of left artificial shoulder joint: Secondary | ICD-10-CM | POA: Diagnosis not present

## 2017-01-17 DIAGNOSIS — M25612 Stiffness of left shoulder, not elsewhere classified: Secondary | ICD-10-CM | POA: Diagnosis not present

## 2017-01-22 DIAGNOSIS — M6281 Muscle weakness (generalized): Secondary | ICD-10-CM | POA: Diagnosis not present

## 2017-01-22 DIAGNOSIS — Z96612 Presence of left artificial shoulder joint: Secondary | ICD-10-CM | POA: Diagnosis not present

## 2017-01-22 DIAGNOSIS — M25512 Pain in left shoulder: Secondary | ICD-10-CM | POA: Diagnosis not present

## 2017-01-22 DIAGNOSIS — M25612 Stiffness of left shoulder, not elsewhere classified: Secondary | ICD-10-CM | POA: Diagnosis not present

## 2017-01-27 DIAGNOSIS — Z96612 Presence of left artificial shoulder joint: Secondary | ICD-10-CM | POA: Diagnosis not present

## 2017-01-27 DIAGNOSIS — M25512 Pain in left shoulder: Secondary | ICD-10-CM | POA: Diagnosis not present

## 2017-01-27 DIAGNOSIS — M25612 Stiffness of left shoulder, not elsewhere classified: Secondary | ICD-10-CM | POA: Diagnosis not present

## 2017-02-03 DIAGNOSIS — Z96612 Presence of left artificial shoulder joint: Secondary | ICD-10-CM | POA: Diagnosis not present

## 2017-02-05 DIAGNOSIS — M25612 Stiffness of left shoulder, not elsewhere classified: Secondary | ICD-10-CM | POA: Diagnosis not present

## 2017-02-05 DIAGNOSIS — M6281 Muscle weakness (generalized): Secondary | ICD-10-CM | POA: Diagnosis not present

## 2017-02-05 DIAGNOSIS — Z96612 Presence of left artificial shoulder joint: Secondary | ICD-10-CM | POA: Diagnosis not present

## 2017-02-05 DIAGNOSIS — M25512 Pain in left shoulder: Secondary | ICD-10-CM | POA: Diagnosis not present

## 2017-02-06 DIAGNOSIS — I38 Endocarditis, valve unspecified: Secondary | ICD-10-CM | POA: Diagnosis not present

## 2017-02-06 DIAGNOSIS — Z23 Encounter for immunization: Secondary | ICD-10-CM | POA: Diagnosis not present

## 2017-02-06 DIAGNOSIS — E7800 Pure hypercholesterolemia, unspecified: Secondary | ICD-10-CM | POA: Diagnosis not present

## 2017-02-06 DIAGNOSIS — Z79899 Other long term (current) drug therapy: Secondary | ICD-10-CM | POA: Diagnosis not present

## 2017-02-06 DIAGNOSIS — Z125 Encounter for screening for malignant neoplasm of prostate: Secondary | ICD-10-CM | POA: Diagnosis not present

## 2017-02-06 DIAGNOSIS — I10 Essential (primary) hypertension: Secondary | ICD-10-CM | POA: Diagnosis not present

## 2017-02-06 DIAGNOSIS — E118 Type 2 diabetes mellitus with unspecified complications: Secondary | ICD-10-CM | POA: Diagnosis not present

## 2017-02-06 DIAGNOSIS — N184 Chronic kidney disease, stage 4 (severe): Secondary | ICD-10-CM | POA: Diagnosis not present

## 2017-02-17 DIAGNOSIS — M25612 Stiffness of left shoulder, not elsewhere classified: Secondary | ICD-10-CM | POA: Diagnosis not present

## 2017-03-05 DIAGNOSIS — Z794 Long term (current) use of insulin: Secondary | ICD-10-CM | POA: Diagnosis not present

## 2017-03-05 DIAGNOSIS — Z125 Encounter for screening for malignant neoplasm of prostate: Secondary | ICD-10-CM | POA: Diagnosis not present

## 2017-03-05 DIAGNOSIS — I10 Essential (primary) hypertension: Secondary | ICD-10-CM | POA: Diagnosis not present

## 2017-03-05 DIAGNOSIS — Z79899 Other long term (current) drug therapy: Secondary | ICD-10-CM | POA: Diagnosis not present

## 2017-03-05 DIAGNOSIS — N184 Chronic kidney disease, stage 4 (severe): Secondary | ICD-10-CM | POA: Diagnosis not present

## 2017-03-05 DIAGNOSIS — E1122 Type 2 diabetes mellitus with diabetic chronic kidney disease: Secondary | ICD-10-CM | POA: Diagnosis not present

## 2017-03-11 DIAGNOSIS — M109 Gout, unspecified: Secondary | ICD-10-CM | POA: Diagnosis not present

## 2017-03-11 DIAGNOSIS — E1122 Type 2 diabetes mellitus with diabetic chronic kidney disease: Secondary | ICD-10-CM | POA: Diagnosis not present

## 2017-03-11 DIAGNOSIS — N184 Chronic kidney disease, stage 4 (severe): Secondary | ICD-10-CM | POA: Diagnosis not present

## 2017-03-11 DIAGNOSIS — I129 Hypertensive chronic kidney disease with stage 1 through stage 4 chronic kidney disease, or unspecified chronic kidney disease: Secondary | ICD-10-CM | POA: Diagnosis not present

## 2017-03-11 DIAGNOSIS — I701 Atherosclerosis of renal artery: Secondary | ICD-10-CM | POA: Diagnosis not present

## 2017-03-11 DIAGNOSIS — D631 Anemia in chronic kidney disease: Secondary | ICD-10-CM | POA: Diagnosis not present

## 2017-03-11 DIAGNOSIS — R809 Proteinuria, unspecified: Secondary | ICD-10-CM | POA: Diagnosis not present

## 2017-03-12 DIAGNOSIS — N184 Chronic kidney disease, stage 4 (severe): Secondary | ICD-10-CM | POA: Diagnosis not present

## 2017-03-12 DIAGNOSIS — E1122 Type 2 diabetes mellitus with diabetic chronic kidney disease: Secondary | ICD-10-CM | POA: Diagnosis not present

## 2017-03-12 DIAGNOSIS — I10 Essential (primary) hypertension: Secondary | ICD-10-CM | POA: Diagnosis not present

## 2017-03-12 DIAGNOSIS — Z794 Long term (current) use of insulin: Secondary | ICD-10-CM | POA: Diagnosis not present

## 2017-04-07 NOTE — Progress Notes (Signed)
Cardiology Office Note  Date:  04/08/2017   ID:  Dylan Sanders, DOB [birth year], MRN [id number]  PCP:  [name], MD   Chief Complaint  Patient presents with  . other    New Patient. Referred by Dr. Doy Hutching for valvular heart disease. Patient denies chest pain and SOB. Meds reviewed verbally with patient.     HPI:  Dylan Sanders is a 83 year old gentleman with past medical history of Hypertension Type 2 diabetes Chronic kidney disease, stage IV, creatinine 2.7 Renal artery stenosis, left renal artery is a total occlusion and that the right renal artery is only 50% stenosis no intervention  Hyperlipidemia, on a statin aortic valve disease, mild insufficiency per the notes Colon cancer Smoking history quit April 1978 Patient of Dr. Doy Hutching Previous seen by Dr. Ubaldo Glassing Who presents for new patient evaluation for his aortic valve disease, PAD  Reports he is followed by Dr. Ronalee Belts for renal stenosis Angiography of the renal arteries was performed 11/19/2016  Noted to have occlusion of the left renal artery was identified  50% narrowing of the right renal artery was noted. No intervention was performed.  Denies any significant shortness of breath or chest pain symptoms Denies any near-syncope or syncope No regular exercise program  Lab work reviewed with him in detail showing hemoglobin A1c 6.4, LDL 88, TSH 3.4  Previous echocardiogram from early 2016 reviewed with him indicating mild aortic valve insufficiency, mild TR, MR, normal ejection fraction  EKG personally reviewed by myself on todays visit Shows sinus bradycardia rate 57 bpm no significant ST or T wave changes   PMH:   has a past medical history of Cancer ([place]), Chronic kidney disease, Diabetes mellitus without complication ([place]), and Hypertension.  PSH:    Past Surgical History:  Procedure Laterality Date  . COLON SURGERY    . EYE SURGERY     bilateral  . RENAL ANGIOGRAPHY Bilateral 11/19/2016    Procedure: Renal Angiography;  Surgeon: Katha Cabal, MD;  Location: [facility];  Service: Cardiovascular;  Laterality: Bilateral;  . REVERSE SHOULDER ARTHROPLASTY Left 09/19/2016   Procedure: REVERSE SHOULDER ARTHROPLASTY;  Surgeon: Corky Mull, MD;  Location: [facility];  Service: Orthopedics;  Laterality: Left;  . ROTATOR CUFF REPAIR Right     Current Outpatient Medications  Medication Sig Dispense Refill  . acetaminophen (TYLENOL) 500 MG tablet Take 500 mg by mouth every 6 (six) hours as needed for mild pain.    Marland Kitchen allopurinol (ZYLOPRIM) 100 MG tablet Take 100 mg by mouth daily.     Marland Kitchen amLODipine (NORVASC) 5 MG tablet Take 5 mg by mouth daily.    Marland Kitchen aspirin EC 81 MG tablet Take 81 mg by mouth at bedtime.     . Blood Glucose Monitoring Suppl (ONE TOUCH ULTRA 2) w/Device KIT USE TO CHECK SUGARS TWICE DAILY    . furosemide (LASIX) 20 MG tablet Take 20 mg by mouth daily.     Marland Kitchen glipiZIDE (GLUCOTROL XL) 5 MG 24 hr tablet Take 5 mg by mouth daily.     Marland Kitchen LANTUS SOLOSTAR 100 UNIT/ML Solostar Pen Inject 15 Units into the skin at bedtime.     Marland Kitchen losartan (COZAAR) 100 MG tablet Take 100 mg by mouth daily.     . simvastatin (ZOCOR) 20 MG tablet Take 20 mg by mouth daily.     . verapamil (CALAN-SR) 240 MG CR tablet Take 240 mg by mouth daily.     No current facility-administered  medications for this visit.      Allergies:   Patient has no known allergies.   Social History:  The patient  reports that he quit smoking about 40 years ago. he has never used smokeless tobacco. He reports that he drinks alcohol. He reports that he does not use drugs.   Family History:   family history includes Cancer in his father.    Review of Systems: Review of Systems  Constitutional: Negative.   Respiratory: Negative.   Cardiovascular: Negative.   Gastrointestinal: Negative.   Musculoskeletal: Negative.   Neurological: Negative.   Psychiatric/Behavioral: Negative.   All other systems reviewed  and are negative.    PHYSICAL EXAM: VS:  BP (!) 160/60 (BP Location: Left Arm, Patient Position: Sitting, Cuff Size: Normal)   Pulse (!) 57   Ht _0  (1.727 m)   Wt 156 lb 12 oz (71.1 kg)   BMI 23.83 kg/m  , BMI Body mass index is 23.83 kg/m. GEN: Well nourished, well developed, in no acute distress  HEENT: normal  Neck: no JVD, carotid bruits, or masses Cardiac: RRR; 2/6 SEM RSB  murmurs,  sound radiating up right carotid rubs greater than left, no gallops,no edema  Respiratory:  clear to auscultation bilaterally, normal work of breathing GI: soft, nontender, nondistended, + BS MS: no deformity or atrophy  Skin: warm and dry, no rash Neuro:  Strength and sensation are intact Psych: euthymic mood, full affect    Recent Labs: 09/20/2016: Hemoglobin 11.4; Platelets 167; Potassium 5.0; Sodium 137 11/19/2016: BUN 45; Creatinine, Ser 2.56    Lipid Panel No results found for: CHOL, HDL, LDLCALC, TRIG    Wt Readings from Last 3 Encounters:  04/08/17 156 lb 12 oz (71.1 kg)  12/02/16 158 lb (71.7 kg)  11/19/16 153 lb 3.5 oz (69.5 kg)       ASSESSMENT AND PLAN:  Renal artery stenosis (HCC) -  Managed by Dr. Ronalee Belts, occluded on left, medical management on right  Type 2 diabetes mellitus with stage 4 chronic kidney disease, with long-term current use of insulin ([place]) - Plan: EKG 12-Lead We have encouraged continued exercise, careful diet management in an effort to lose weight. Medically managed  Chronic renal impairment, stage 4 (severe) ([place]) - Plan: EKG 12-Lead Followed by vascular surgery, primary care  Mixed hyperlipidemia - Plan: EKG 12-Lead, ECHOCARDIOGRAM COMPLETE, VAS US CAROTID On a statin.  Recommend goal LDL less than 70  Essential hypertension - Plan: EKG 12-Lead, ECHOCARDIOGRAM COMPLETE, VAS US CAROTID  Carotid bruit Likely secondary to radiation of murmur from aortic valve disease Carotid ultrasound ordered More prominent on the right than the  left  Aortic valve disease Repeat echocardiogram ordered given prominent murmur Last echocardiogram 2 and half years ago.  Murmur concerning for degree of stenosis Previous echocardiogram with regurgitation Of concern is prominent bruit extending up the right carotid.  We will extend the ultrasound to evaluate bilateral carotids   Disposition:   F/U  12 months   Total encounter time more than 45 minutes  Greater than 50% was spent in counseling and coordination of care with the patient    Orders Placed This Encounter  Procedures  . EKG 12-Lead  . ECHOCARDIOGRAM COMPLETE     Signed, Esmond Plants, M.D., Ph.D. 04/08/2017  [place], [place]

## 2017-04-08 ENCOUNTER — Ambulatory Visit: Payer: PPO | Admitting: Cardiovascular Disease

## 2017-04-08 ENCOUNTER — Encounter: Payer: Self-pay | Admitting: Cardiovascular Disease

## 2017-04-08 VITALS — BP 160/60 | HR 57 | Ht 68.0 in | Wt 156.8 lb

## 2017-04-08 DIAGNOSIS — N184 Chronic kidney disease, stage 4 (severe): Secondary | ICD-10-CM | POA: Diagnosis not present

## 2017-04-08 DIAGNOSIS — Z794 Long term (current) use of insulin: Secondary | ICD-10-CM

## 2017-04-08 DIAGNOSIS — R0989 Other specified symptoms and signs involving the circulatory and respiratory systems: Secondary | ICD-10-CM | POA: Diagnosis not present

## 2017-04-08 DIAGNOSIS — I10 Essential (primary) hypertension: Secondary | ICD-10-CM | POA: Diagnosis not present

## 2017-04-08 DIAGNOSIS — I701 Atherosclerosis of renal artery: Secondary | ICD-10-CM | POA: Diagnosis not present

## 2017-04-08 DIAGNOSIS — I359 Nonrheumatic aortic valve disorder, unspecified: Secondary | ICD-10-CM

## 2017-04-08 DIAGNOSIS — E782 Mixed hyperlipidemia: Secondary | ICD-10-CM | POA: Diagnosis not present

## 2017-04-08 DIAGNOSIS — E1122 Type 2 diabetes mellitus with diabetic chronic kidney disease: Secondary | ICD-10-CM

## 2017-04-08 NOTE — Patient Instructions (Addendum)
Medication Instructions:   No medication changes made  Labwork:  No new labs needed  Testing/Procedures:  Think about a CT coronary calcium score [place], $150  We will order an echocardiogram for aortic valve disease in early 2019  We will order carotid artery dopplers to be done along with echo in early 2019.   Follow-Up: It was a pleasure seeing you in the office today. Please call us if you have new issues that need to be addressed before your next appt.  [phone]  Your physician wants you to follow-up in: 12 months.  You will receive a reminder letter in the mail two months in advance. If you don't receive a letter, please call our office to schedule the follow-up appointment.  If you need a refill on your cardiac medications before your next appointment, please call your pharmacy.     Echocardiogram An echocardiogram, or echocardiography, uses sound waves (ultrasound) to produce an image of your heart. The echocardiogram is simple, painless, obtained within a short period of time, and offers valuable information to your health care provider. The images from an echocardiogram can provide information such as:  Evidence of coronary artery disease (CAD).  Heart size.  Heart muscle function.  Heart valve function.  Aneurysm detection.  Evidence of a past heart attack.  Fluid buildup around the heart.  Heart muscle thickening.  Assess heart valve function.  Tell a health care provider about:  Any allergies you have.  All medicines you are taking, including vitamins, herbs, eye drops, creams, and over-the-counter medicines.  Any problems you or family members have had with anesthetic medicines.  Any blood disorders you have.  Any surgeries you have had.  Any medical conditions you have.  Whether you are pregnant or may be pregnant. What happens before the procedure? No special preparation is needed. Eat and drink normally. What happens during the  procedure?  In order to produce an image of your heart, gel will be applied to your chest and a wand-like tool (transducer) will be moved over your chest. The gel will help transmit the sound waves from the transducer. The sound waves will harmlessly bounce off your heart to allow the heart images to be captured in real-time motion. These images will then be recorded.  You may need an IV to receive a medicine that improves the quality of the pictures. What happens after the procedure? You may return to your normal schedule including diet, activities, and medicines, unless your health care provider tells you otherwise. This information is not intended to replace advice given to you by your health care provider. Make sure you discuss any questions you have with your health care provider. Document Released: 04/26/2000 Document Revised: 12/16/2015 Document Reviewed: 01/04/2013 Elsevier Interactive Patient Education  2017 [employer].

## 2017-05-08 DIAGNOSIS — Z79899 Other long term (current) drug therapy: Secondary | ICD-10-CM | POA: Diagnosis not present

## 2017-05-08 DIAGNOSIS — I10 Essential (primary) hypertension: Secondary | ICD-10-CM | POA: Diagnosis not present

## 2017-05-08 DIAGNOSIS — N184 Chronic kidney disease, stage 4 (severe): Secondary | ICD-10-CM | POA: Diagnosis not present

## 2017-05-08 DIAGNOSIS — E118 Type 2 diabetes mellitus with unspecified complications: Secondary | ICD-10-CM | POA: Diagnosis not present

## 2017-05-08 DIAGNOSIS — E7800 Pure hypercholesterolemia, unspecified: Secondary | ICD-10-CM | POA: Diagnosis not present

## 2017-05-08 DIAGNOSIS — Z8739 Personal history of other diseases of the musculoskeletal system and connective tissue: Secondary | ICD-10-CM | POA: Diagnosis not present

## 2017-05-12 ENCOUNTER — Other Ambulatory Visit: Payer: Self-pay | Admitting: Cardiovascular Disease

## 2017-05-12 DIAGNOSIS — R0989 Other specified symptoms and signs involving the circulatory and respiratory systems: Secondary | ICD-10-CM

## 2017-05-12 DIAGNOSIS — I359 Nonrheumatic aortic valve disorder, unspecified: Secondary | ICD-10-CM

## 2017-05-16 ENCOUNTER — Other Ambulatory Visit: Payer: Self-pay

## 2017-05-16 ENCOUNTER — Ambulatory Visit (INDEPENDENT_AMBULATORY_CARE_PROVIDER_SITE_OTHER): Payer: PPO

## 2017-05-16 DIAGNOSIS — R0989 Other specified symptoms and signs involving the circulatory and respiratory systems: Secondary | ICD-10-CM | POA: Diagnosis not present

## 2017-05-16 DIAGNOSIS — I359 Nonrheumatic aortic valve disorder, unspecified: Secondary | ICD-10-CM

## 2017-05-17 LAB — VAS US CAROTID
LCCADDIAS: -11 cm/s
LCCADSYS: -75 cm/s
LEFT VERTEBRAL DIAS: 13 cm/s
LICADSYS: 57 cm/s
LICAPDIAS: -23 cm/s
Left CCA prox dias: 13 cm/s
Left CCA prox sys: 92 cm/s
Left ICA dist dias: 9 cm/s
Left ICA prox sys: -153 cm/s
RCCADSYS: -90 cm/s
RCCAPDIAS: 11 cm/s
RIGHT VERTEBRAL DIAS: 13 cm/s
Right CCA prox sys: 70 cm/s

## 2017-05-19 ENCOUNTER — Other Ambulatory Visit: Payer: Self-pay | Admitting: *Deleted

## 2017-05-19 DIAGNOSIS — I739 Peripheral vascular disease, unspecified: Secondary | ICD-10-CM

## 2017-05-19 DIAGNOSIS — Z79899 Other long term (current) drug therapy: Secondary | ICD-10-CM

## 2017-05-19 DIAGNOSIS — I779 Disorder of arteries and arterioles, unspecified: Secondary | ICD-10-CM

## 2017-06-04 NOTE — Progress Notes (Signed)
MRN : [id number]  Dylan Sanders is a 84 y.o. (1932/04/17) male who presents with chief complaint of No chief complaint on file. Marland Kitchen  History of Present Illness: The patient returns to the office for followup and review of the noninvasive studies regarding renal vascular hypertension and renal artery stenosis. There have been no interval changes in the patient's blood pressure control.  He denies any major changes in is medications.  The patient denies headache or flushing.  No flank or unusual back pain.    There have been no significant changes to the patient's overall health care.  No interval shortening of the patient's walking distance or new symptoms consistent with claudication.  The patient denies the  development of rest pain symptoms. No new ulcers or wounds have occurred since the last visit.  The patient denies amaurosis fugax or recent TIA symptoms. There are no recent neurological changes noted. The patient denies history of DVT, PE or superficial thrombophlebitis. The patient denies recent episodes of angina or shortness of breath.     No outpatient medications have been marked as taking for the 06/05/17 encounter (Appointment) with Delana Meyer, Dolores Lory, MD.    Past Medical History:  Diagnosis Date  . Cancer ([place])    colon  . Chronic kidney disease   . Diabetes mellitus without complication ([place])   . Hypertension     Past Surgical History:  Procedure Laterality Date  . COLON SURGERY    . EYE SURGERY     bilateral  . RENAL ANGIOGRAPHY Bilateral 11/19/2016   Procedure: Renal Angiography;  Surgeon: Katha Cabal, MD;  Location: [facility];  Service: Cardiovascular;  Laterality: Bilateral;  . REVERSE SHOULDER ARTHROPLASTY Left 09/19/2016   Procedure: REVERSE SHOULDER ARTHROPLASTY;  Surgeon: Corky Mull, MD;  Location: [facility];  Service: Orthopedics;  Laterality: Left;  . ROTATOR CUFF REPAIR Right     Social History Social History   Tobacco Use  .  Smoking status: Former Smoker    Last attempt to quit: 09/05/1976    Years since quitting: 40.7  . Smokeless tobacco: Never Used  Substance Use Topics  . Alcohol use: Yes    Comment: ocassionally  . Drug use: No    Family History Family History  Problem Relation Age of Onset  . Cancer Father     No Known Allergies   REVIEW OF SYSTEMS (Negative unless checked)  Constitutional: [] Weight loss  [] Fever  [] Chills Cardiac: [] Chest pain   [] Chest pressure   [] Palpitations   [] Shortness of breath when laying flat   [] Shortness of breath with exertion. Vascular:  [] Pain in legs with walking   [] Pain in legs at rest  [] History of DVT   [] Phlebitis   [] Swelling in legs   [] Varicose veins   [] Non-healing ulcers Pulmonary:   [] Uses home oxygen   [] Productive cough   [] Hemoptysis   [] Wheeze  [] COPD   [] Asthma Neurologic:  [] Dizziness   [] Seizures   [] History of stroke   [] History of TIA  [] Aphasia   [] Vissual changes   [] Weakness or numbness in arm   [] Weakness or numbness in leg Musculoskeletal:   [] Joint swelling   [] Joint pain   [] Low back pain Hematologic:  [] Easy bruising  [] Easy bleeding   [] Hypercoagulable state   [] Anemic Gastrointestinal:  [] Diarrhea   [] Vomiting  [] Gastroesophageal reflux/heartburn   [] Difficulty swallowing. Genitourinary:  [] Chronic kidney disease   [] Difficult urination  [] Frequent urination   [] Blood in urine Skin:  []   Rashes   [] Ulcers  Psychological:  [] History of anxiety   []  History of major depression.  Physical Examination  There were no vitals filed for this visit. There is no height or weight on file to calculate BMI. Gen: WD/WN, NAD Head: [place]/AT, No temporalis wasting.  Ear/Nose/Throat: Hearing grossly intact, nares w/o erythema or drainage Eyes: PER, EOMI, sclera nonicteric.  Neck: Supple, no large masses.   Pulmonary:  Good air movement, no audible wheezing bilaterally, no use of accessory muscles.  Cardiac: RRR, no JVD Vascular:  Vessel Right Left   Radial Palpable Palpable  Gastrointestinal: Non-distended. No guarding/no peritoneal signs.  Musculoskeletal: M/S 5/5 throughout.  No deformity or atrophy.  Neurologic: CN 2-12 intact. Symmetrical.  Speech is fluent. Motor exam as listed above. Psychiatric: Judgment intact, Mood & affect appropriate for pt's clinical situation. Dermatologic: No rashes or ulcers noted.  No changes consistent with cellulitis. Lymph : No lichenification or skin changes of chronic lymphedema.  CBC Lab Results  Component Value Date   WBC 14.1 (H) 09/20/2016   HGB 11.4 (L) 09/20/2016   HCT 33.6 (L) 09/20/2016   MCV 96.4 09/20/2016   PLT 167 09/20/2016    BMET    Component Value Date/Time   NA 137 09/20/2016 0438   NA 141 06/19/2011 1343   K 5.0 09/20/2016 0438   K 4.1 06/19/2011 1343   CL 110 09/20/2016 0438   CL 102 06/19/2011 1343   CO2 20 (L) 09/20/2016 0438   CO2 32 06/19/2011 1343   GLUCOSE 185 (H) 09/20/2016 0438   GLUCOSE 193 (H) 06/19/2011 1343   BUN 45 (H) 11/19/2016 0714   BUN 26 (H) 06/19/2011 1343   CREATININE 2.56 (H) 11/19/2016 0714   CREATININE 2.43 (H) 06/19/2011 1343   CALCIUM 8.4 (L) 09/20/2016 0438   CALCIUM 8.9 06/19/2011 1343   GFRNONAA 22 (L) 11/19/2016 0714   GFRNONAA 28 (L) 06/19/2011 1343   GFRAA 25 (L) 11/19/2016 0714   GFRAA 33 (L) 06/19/2011 1343   CrCl cannot be calculated (Patient's most recent lab result is older than the maximum 21 days allowed.).  COAG Lab Results  Component Value Date   INR 1.08 09/05/2016    Radiology No results found.   Assessment/Plan 1. Renal artery stenosis (HCC) BP today was acceptable Given patient's atherosclerosis and PAD optimal control of the patient's hypertension is important.  The patient's BP and noninvasive studies support the previous intervention is patent. No further intervention is indicated at this time.  Therefore the patient  will continue the current medications, no changes at this time.  The primary  medical service will continue aggressive antihypertensive therapy as per the AHA guidelines   - VAS US RENAL ARTERY DUPLEX; Future  2. Essential hypertension Continue antihypertensive medications as already ordered, these medications have been reviewed and there are no changes at this time.   3. Type 2 diabetes mellitus with stage 4 chronic kidney disease, with long-term current use of insulin (HCC) Continue hypoglycemic medications as already ordered, these medications have been reviewed and there are no changes at this time.  Hgb A1C to be monitored as already arranged by primary service   4. Mixed hyperlipidemia Continue statin as ordered and reviewed, no changes at this time     Hortencia Pilar, MD  06/04/2017 9:02 PM

## 2017-06-05 ENCOUNTER — Encounter (INDEPENDENT_AMBULATORY_CARE_PROVIDER_SITE_OTHER): Payer: Self-pay | Admitting: Vascular Surgery

## 2017-06-05 ENCOUNTER — Ambulatory Visit (INDEPENDENT_AMBULATORY_CARE_PROVIDER_SITE_OTHER): Payer: PPO | Admitting: Vascular Surgery

## 2017-06-05 ENCOUNTER — Ambulatory Visit (INDEPENDENT_AMBULATORY_CARE_PROVIDER_SITE_OTHER): Payer: PPO

## 2017-06-05 VITALS — BP 159/79 | HR 62 | Resp 16 | Ht 68.0 in | Wt 155.0 lb

## 2017-06-05 DIAGNOSIS — I10 Essential (primary) hypertension: Secondary | ICD-10-CM

## 2017-06-05 DIAGNOSIS — E1122 Type 2 diabetes mellitus with diabetic chronic kidney disease: Secondary | ICD-10-CM | POA: Diagnosis not present

## 2017-06-05 DIAGNOSIS — I701 Atherosclerosis of renal artery: Secondary | ICD-10-CM

## 2017-06-05 DIAGNOSIS — Z794 Long term (current) use of insulin: Secondary | ICD-10-CM | POA: Diagnosis not present

## 2017-06-05 DIAGNOSIS — E782 Mixed hyperlipidemia: Secondary | ICD-10-CM

## 2017-06-05 DIAGNOSIS — N184 Chronic kidney disease, stage 4 (severe): Secondary | ICD-10-CM

## 2017-06-08 ENCOUNTER — Encounter (INDEPENDENT_AMBULATORY_CARE_PROVIDER_SITE_OTHER): Payer: Self-pay | Admitting: Vascular Surgery

## 2017-06-10 DIAGNOSIS — Z79899 Other long term (current) drug therapy: Secondary | ICD-10-CM | POA: Diagnosis not present

## 2017-06-10 DIAGNOSIS — N184 Chronic kidney disease, stage 4 (severe): Secondary | ICD-10-CM | POA: Diagnosis not present

## 2017-06-10 DIAGNOSIS — I10 Essential (primary) hypertension: Secondary | ICD-10-CM | POA: Diagnosis not present

## 2017-06-10 DIAGNOSIS — E118 Type 2 diabetes mellitus with unspecified complications: Secondary | ICD-10-CM | POA: Diagnosis not present

## 2017-06-10 DIAGNOSIS — E7800 Pure hypercholesterolemia, unspecified: Secondary | ICD-10-CM | POA: Diagnosis not present

## 2017-06-18 DIAGNOSIS — N184 Chronic kidney disease, stage 4 (severe): Secondary | ICD-10-CM | POA: Diagnosis not present

## 2017-06-18 DIAGNOSIS — I129 Hypertensive chronic kidney disease with stage 1 through stage 4 chronic kidney disease, or unspecified chronic kidney disease: Secondary | ICD-10-CM | POA: Diagnosis not present

## 2017-06-18 DIAGNOSIS — E1122 Type 2 diabetes mellitus with diabetic chronic kidney disease: Secondary | ICD-10-CM | POA: Diagnosis not present

## 2017-06-18 DIAGNOSIS — N2581 Secondary hyperparathyroidism of renal origin: Secondary | ICD-10-CM | POA: Diagnosis not present

## 2017-06-18 DIAGNOSIS — R809 Proteinuria, unspecified: Secondary | ICD-10-CM | POA: Diagnosis not present

## 2017-06-18 DIAGNOSIS — M109 Gout, unspecified: Secondary | ICD-10-CM | POA: Diagnosis not present

## 2017-06-25 ENCOUNTER — Ambulatory Visit (INDEPENDENT_AMBULATORY_CARE_PROVIDER_SITE_OTHER)
Admission: RE | Admit: 2017-06-25 | Discharge: 2017-06-25 | Disposition: A | Discharge status: Home / Self Care | Payer: Self-pay | Source: Ambulatory Visit | Attending: Cardiovascular Disease | Admitting: Cardiovascular Disease

## 2017-06-25 DIAGNOSIS — I739 Peripheral vascular disease, unspecified: Secondary | ICD-10-CM

## 2017-06-25 DIAGNOSIS — Z79899 Other long term (current) drug therapy: Secondary | ICD-10-CM

## 2017-06-25 DIAGNOSIS — I779 Disorder of arteries and arterioles, unspecified: Secondary | ICD-10-CM

## 2017-06-30 DIAGNOSIS — N184 Chronic kidney disease, stage 4 (severe): Secondary | ICD-10-CM | POA: Diagnosis not present

## 2017-06-30 DIAGNOSIS — E1122 Type 2 diabetes mellitus with diabetic chronic kidney disease: Secondary | ICD-10-CM | POA: Diagnosis not present

## 2017-06-30 DIAGNOSIS — Z794 Long term (current) use of insulin: Secondary | ICD-10-CM | POA: Diagnosis not present

## 2017-06-30 DIAGNOSIS — E1142 Type 2 diabetes mellitus with diabetic polyneuropathy: Secondary | ICD-10-CM | POA: Diagnosis not present

## 2017-07-01 ENCOUNTER — Telehealth: Payer: Self-pay | Admitting: *Deleted

## 2017-07-01 MED ORDER — EZETIMIBE 10 MG PO TABS: 10.0000 mg | ORAL_TABLET | Freq: Every day | ORAL | 3 refills | Status: DC

## 2017-07-01 NOTE — Telephone Encounter (Signed)
Notes recorded by Minna Merritts, MD on 06/29/2017 at 2:20 PM EST CT coronary calcium score There is mild to moderate amount of coronary calcification noted, Score is 318, places him in 39th percentile Ideally should have LDL <71m currently is 80s would consider starting zetia 10 mg daily with simva 20  --------------------------------------- Results called to pt. Pt verbalized understanding of results and plan of care. Order for zetia sent in.

## 2017-07-22 DIAGNOSIS — Z8739 Personal history of other diseases of the musculoskeletal system and connective tissue: Secondary | ICD-10-CM | POA: Diagnosis not present

## 2017-07-22 DIAGNOSIS — I10 Essential (primary) hypertension: Secondary | ICD-10-CM | POA: Diagnosis not present

## 2017-07-22 DIAGNOSIS — Z79899 Other long term (current) drug therapy: Secondary | ICD-10-CM | POA: Diagnosis not present

## 2017-07-22 DIAGNOSIS — N184 Chronic kidney disease, stage 4 (severe): Secondary | ICD-10-CM | POA: Diagnosis not present

## 2017-07-22 DIAGNOSIS — E118 Type 2 diabetes mellitus with unspecified complications: Secondary | ICD-10-CM | POA: Diagnosis not present

## 2017-07-22 DIAGNOSIS — E7800 Pure hypercholesterolemia, unspecified: Secondary | ICD-10-CM | POA: Diagnosis not present

## 2017-09-29 ENCOUNTER — Other Ambulatory Visit: Payer: Self-pay

## 2017-09-29 NOTE — Patient Outreach (Signed)
[facility]) Care Management  09/29/2017  Dylan Sanders [birth year] [id number]    Telephone Screen  Referral Date:5/ 20/19 Referral Source: Nurse Call Center Report Referral Reason: "caller states his BP is 103/40 and he feels very weak, takes two BP meds, states stopped taking new BP med 2 wks ago due to feeling weak on med-states med stopped 2wks ago is Amlodipine, sxs started today" Insurance: HTA   Incoming call from patient returning RN CM call. Spoke with patient. He voices that he has called PCP office and is awaiting to hear back regarding appt and further advice on situation. He continues to monitor and record BP. He voices BP was normal this morning at 146/67. Patient states that he has noticed that BP tends to drop in the early evenings around 3-4pm after several hours of having taken BP meds. He feels like his meds need to be adjusted and will discuss this with MD. Patient denies any recent changes in diet and fluid intake. RN CM encouraged patient to stay hydrated as he voices he is outside a lot during the day and being active. He voiced understanding. Patient denies any RN CM needs or concerns. He is just waiting to hear back from MD. He is aware that he can call 24 hr Nurse Line again for any future needs or concerns. He voiced understanding and was appreciative of call.      Plan: RN CM will close case as no further interventions needed at this time.   Enzo Montgomery, RN,BSN,CCM [facility] Telephonic Care Management Coordinator Direct Phone: [phone] Toll Free: [phone] Fax: [phone]

## 2017-09-29 NOTE — Patient Outreach (Signed)
[facility]) Care Management  09/29/2017  Dylan Sanders 08/08/1931 [id number]   Telephone Screen  Referral Date:5/ 20/19 Referral Source: Nurse Call Center Report Referral Reason: "caller states his BP is 103/40 and he feels very weak, takes two BP meds, states stopped taking new BP med 2 wks ago due to feeling weak on med-states med stopped 2wks ago is Amlodipine, sxs started today" Insurance: HTA   Outreach attempt # 1 to patient. No answer at present. RN CM left HIPAA compliant voicemail message along with contact info.    Plan: RN CM will make outreach attempt to patient within three business days. RN CM will send unsuccessful outreach letter to patient.   Enzo Montgomery, RN,BSN,CCM [place] [facility] Telephonic Care Management Coordinator Direct Phone: [phone] Toll Free: [phone] Fax: [phone]

## 2017-10-02 DIAGNOSIS — I10 Essential (primary) hypertension: Secondary | ICD-10-CM | POA: Diagnosis not present

## 2017-10-02 DIAGNOSIS — E7800 Pure hypercholesterolemia, unspecified: Secondary | ICD-10-CM | POA: Diagnosis not present

## 2017-10-08 DIAGNOSIS — R809 Proteinuria, unspecified: Secondary | ICD-10-CM | POA: Diagnosis not present

## 2017-10-08 DIAGNOSIS — I701 Atherosclerosis of renal artery: Secondary | ICD-10-CM | POA: Diagnosis not present

## 2017-10-08 DIAGNOSIS — N184 Chronic kidney disease, stage 4 (severe): Secondary | ICD-10-CM | POA: Diagnosis not present

## 2017-10-08 DIAGNOSIS — E1122 Type 2 diabetes mellitus with diabetic chronic kidney disease: Secondary | ICD-10-CM | POA: Diagnosis not present

## 2017-10-08 DIAGNOSIS — I129 Hypertensive chronic kidney disease with stage 1 through stage 4 chronic kidney disease, or unspecified chronic kidney disease: Secondary | ICD-10-CM | POA: Diagnosis not present

## 2017-10-08 DIAGNOSIS — E875 Hyperkalemia: Secondary | ICD-10-CM | POA: Diagnosis not present

## 2017-10-21 DIAGNOSIS — E1122 Type 2 diabetes mellitus with diabetic chronic kidney disease: Secondary | ICD-10-CM | POA: Diagnosis not present

## 2017-10-21 DIAGNOSIS — N184 Chronic kidney disease, stage 4 (severe): Secondary | ICD-10-CM | POA: Diagnosis not present

## 2017-10-21 DIAGNOSIS — E7800 Pure hypercholesterolemia, unspecified: Secondary | ICD-10-CM | POA: Diagnosis not present

## 2017-10-21 DIAGNOSIS — I10 Essential (primary) hypertension: Secondary | ICD-10-CM | POA: Diagnosis not present

## 2017-10-21 DIAGNOSIS — Z79899 Other long term (current) drug therapy: Secondary | ICD-10-CM | POA: Diagnosis not present

## 2017-10-21 DIAGNOSIS — Z794 Long term (current) use of insulin: Secondary | ICD-10-CM | POA: Diagnosis not present

## 2017-10-28 DIAGNOSIS — E1142 Type 2 diabetes mellitus with diabetic polyneuropathy: Secondary | ICD-10-CM | POA: Diagnosis not present

## 2017-10-28 DIAGNOSIS — I10 Essential (primary) hypertension: Secondary | ICD-10-CM | POA: Diagnosis not present

## 2017-10-28 DIAGNOSIS — Z794 Long term (current) use of insulin: Secondary | ICD-10-CM | POA: Diagnosis not present

## 2017-10-28 DIAGNOSIS — E1122 Type 2 diabetes mellitus with diabetic chronic kidney disease: Secondary | ICD-10-CM | POA: Diagnosis not present

## 2017-10-28 DIAGNOSIS — N184 Chronic kidney disease, stage 4 (severe): Secondary | ICD-10-CM | POA: Diagnosis not present

## 2017-11-04 DIAGNOSIS — N184 Chronic kidney disease, stage 4 (severe): Secondary | ICD-10-CM | POA: Diagnosis not present

## 2017-11-04 DIAGNOSIS — E118 Type 2 diabetes mellitus with unspecified complications: Secondary | ICD-10-CM | POA: Diagnosis not present

## 2017-11-04 DIAGNOSIS — Z0000 Encounter for general adult medical examination without abnormal findings: Secondary | ICD-10-CM | POA: Diagnosis not present

## 2017-11-04 DIAGNOSIS — E7800 Pure hypercholesterolemia, unspecified: Secondary | ICD-10-CM | POA: Diagnosis not present

## 2017-11-04 DIAGNOSIS — I10 Essential (primary) hypertension: Secondary | ICD-10-CM | POA: Diagnosis not present

## 2017-12-12 DIAGNOSIS — H0015 Chalazion left lower eyelid: Secondary | ICD-10-CM | POA: Diagnosis not present

## 2018-01-06 DIAGNOSIS — H35372 Puckering of macula, left eye: Secondary | ICD-10-CM | POA: Diagnosis not present

## 2018-01-06 DIAGNOSIS — E119 Type 2 diabetes mellitus without complications: Secondary | ICD-10-CM | POA: Diagnosis not present

## 2018-01-22 DIAGNOSIS — D631 Anemia in chronic kidney disease: Secondary | ICD-10-CM | POA: Diagnosis not present

## 2018-01-22 DIAGNOSIS — I129 Hypertensive chronic kidney disease with stage 1 through stage 4 chronic kidney disease, or unspecified chronic kidney disease: Secondary | ICD-10-CM | POA: Diagnosis not present

## 2018-01-22 DIAGNOSIS — N2581 Secondary hyperparathyroidism of renal origin: Secondary | ICD-10-CM | POA: Diagnosis not present

## 2018-01-22 DIAGNOSIS — N184 Chronic kidney disease, stage 4 (severe): Secondary | ICD-10-CM | POA: Diagnosis not present

## 2018-01-22 DIAGNOSIS — E1122 Type 2 diabetes mellitus with diabetic chronic kidney disease: Secondary | ICD-10-CM | POA: Diagnosis not present

## 2018-02-05 DIAGNOSIS — R809 Proteinuria, unspecified: Secondary | ICD-10-CM | POA: Diagnosis not present

## 2018-02-05 DIAGNOSIS — I129 Hypertensive chronic kidney disease with stage 1 through stage 4 chronic kidney disease, or unspecified chronic kidney disease: Secondary | ICD-10-CM | POA: Diagnosis not present

## 2018-02-05 DIAGNOSIS — E1122 Type 2 diabetes mellitus with diabetic chronic kidney disease: Secondary | ICD-10-CM | POA: Diagnosis not present

## 2018-02-05 DIAGNOSIS — N184 Chronic kidney disease, stage 4 (severe): Secondary | ICD-10-CM | POA: Diagnosis not present

## 2018-02-10 DIAGNOSIS — Z79899 Other long term (current) drug therapy: Secondary | ICD-10-CM | POA: Diagnosis not present

## 2018-02-10 DIAGNOSIS — I10 Essential (primary) hypertension: Secondary | ICD-10-CM | POA: Diagnosis not present

## 2018-02-10 DIAGNOSIS — N184 Chronic kidney disease, stage 4 (severe): Secondary | ICD-10-CM | POA: Diagnosis not present

## 2018-02-10 DIAGNOSIS — E7800 Pure hypercholesterolemia, unspecified: Secondary | ICD-10-CM | POA: Diagnosis not present

## 2018-02-10 DIAGNOSIS — E118 Type 2 diabetes mellitus with unspecified complications: Secondary | ICD-10-CM | POA: Diagnosis not present

## 2018-02-10 DIAGNOSIS — R0609 Other forms of dyspnea: Secondary | ICD-10-CM | POA: Diagnosis not present

## 2018-02-10 DIAGNOSIS — Z23 Encounter for immunization: Secondary | ICD-10-CM | POA: Diagnosis not present

## 2018-02-24 DIAGNOSIS — N184 Chronic kidney disease, stage 4 (severe): Secondary | ICD-10-CM | POA: Diagnosis not present

## 2018-02-24 DIAGNOSIS — Z794 Long term (current) use of insulin: Secondary | ICD-10-CM | POA: Diagnosis not present

## 2018-02-24 DIAGNOSIS — E1122 Type 2 diabetes mellitus with diabetic chronic kidney disease: Secondary | ICD-10-CM | POA: Diagnosis not present

## 2018-03-03 DIAGNOSIS — E1142 Type 2 diabetes mellitus with diabetic polyneuropathy: Secondary | ICD-10-CM | POA: Diagnosis not present

## 2018-03-03 DIAGNOSIS — E1122 Type 2 diabetes mellitus with diabetic chronic kidney disease: Secondary | ICD-10-CM | POA: Diagnosis not present

## 2018-03-03 DIAGNOSIS — I10 Essential (primary) hypertension: Secondary | ICD-10-CM | POA: Diagnosis not present

## 2018-03-03 DIAGNOSIS — Z794 Long term (current) use of insulin: Secondary | ICD-10-CM | POA: Diagnosis not present

## 2018-03-03 DIAGNOSIS — N184 Chronic kidney disease, stage 4 (severe): Secondary | ICD-10-CM | POA: Diagnosis not present

## 2018-03-24 NOTE — Progress Notes (Signed)
Cardiology Office Note  Date:  03/26/2018   ID:  Dylan Sanders, DOB [birth year], MRN [id number]  PCP:  [name], MD   Chief Complaint  Patient presents with  . other    Sob with exertion. Meds reviewed verbally with pt.    HPI:  [name] is a 84 year old gentleman with past medical history of Hypertension Type 2 diabetes Chronic kidney disease, stage IV, creatinine 2.7 Renal artery stenosis, left renal artery is a total occlusion and that the right renal artery is only 50% stenosis no intervention  Hyperlipidemia, on a statin aortic valve disease, mild insufficiency per the notes Colon cancer Smoking history quit April 1978 Who presents for follow-up of his aortic valve disease, PAD  We received a referral from primary care for shortness of breath On further discussion he denies any shortness of breath symptoms only fatigue Fatigue has been a chronic issue, etiology unclear He does have some fatigue with exertion He feels this is appropriate for his age Still goes bowling but doing less than in the past Works in his garden No chest pain or shortness of breath  He is concerned about his worsening renal function Denies any lower extremity edema Has been on Lasix for years  Lab work reviewed with him in detail HBa1C 6.9 Total chol 126  Recent studies reviewed with him today Echocardiogram January 2019 Normal LV function, very mild aortic valve stenosis  CT coronary calcium score February 2019  318, LAD and RCA Right middle lobe nodules Aortic atherosclerosis  Started on zetia, felt he was having side effects LDL 75  EKG personally reviewed by myself on todays visit Shows normal sinus rhythm with rate 53 bpm no significant ST or T wave changes  Other past medical history reviewed  followed by Dr. Ronalee Belts for renal stenosis Angiography of the renal arteries was performed 11/19/2016  Noted to have occlusion of the left renal artery was identified   50% narrowing of the right renal artery was noted. No intervention was performed.   PMH:   has a past medical history of Cancer ([place]), Chronic kidney disease, Diabetes mellitus without complication ([name]), and Hypertension.  PSH:    Past Surgical History:  Procedure Laterality Date  . COLON SURGERY    . EYE SURGERY     bilateral  . RENAL ANGIOGRAPHY Bilateral 11/19/2016   Procedure: Renal Angiography;  Surgeon: Katha Cabal, MD;  Location: [place] [facility];  Service: Cardiovascular;  Laterality: Bilateral;  . REVERSE SHOULDER ARTHROPLASTY Left 09/19/2016   Procedure: REVERSE SHOULDER ARTHROPLASTY;  Surgeon: Corky Mull, MD;  Location: [facility];  Service: Orthopedics;  Laterality: Left;  . ROTATOR CUFF REPAIR Right     Current Outpatient Medications  Medication Sig Dispense Refill  . acetaminophen (TYLENOL) 500 MG tablet Take 500 mg by mouth every 6 (six) hours as needed for mild pain.    Marland Kitchen allopurinol (ZYLOPRIM) 100 MG tablet Take 100 mg by mouth daily.     Marland Kitchen aspirin EC 81 MG tablet Take 81 mg by mouth at bedtime.     . Blood Glucose Monitoring Suppl (ONE TOUCH ULTRA 2) w/Device KIT USE TO CHECK SUGARS TWICE DAILY    . calcitRIOL (ROCALTROL) 0.25 MCG capsule as directed.  4  . carvedilol (COREG) 3.125 MG tablet Take 3.125 mg by mouth daily.     . furosemide (LASIX) 20 MG tablet Take 1 tablet (20 mg total) by mouth daily as needed. 30 tablet   .  LANTUS SOLOSTAR 100 UNIT/ML Solostar Pen Inject 15 Units into the skin at bedtime.     . simvastatin (ZOCOR) 20 MG tablet Take 20 mg by mouth daily.     Marland Kitchen telmisartan (MICARDIS) 40 MG tablet Take by mouth daily.    . VELTASSA 16.8 g PACK daily.    . verapamil (CALAN-SR) 240 MG CR tablet Take 240 mg by mouth daily.     No current facility-administered medications for this visit.      Allergies:   Patient has no known allergies.   Social History:  The patient  reports that he quit smoking about 41 years ago. He has never used  smokeless tobacco. He reports that he drinks alcohol. He reports that he does not use drugs.   Family History:   family history includes Cancer in his father.    Review of Systems: Review of Systems  Constitutional: Positive for malaise/fatigue.  Respiratory: Negative.   Cardiovascular: Negative.   Gastrointestinal: Negative.   Musculoskeletal: Negative.   Neurological: Negative.   Psychiatric/Behavioral: Negative.   All other systems reviewed and are negative.   PHYSICAL EXAM: VS:  BP (!) 150/74 (BP Location: Left Arm, Patient Position: Sitting, Cuff Size: Normal)   Pulse (!) 53   Ht 5' 8"  (1.727 m)   Wt 152 lb 8 oz (69.2 kg)   BMI 23.19 kg/m  , BMI Body mass index is 23.19 kg/m. Constitutional:  oriented to person, place, and time. No distress.  HENT:  Head: Grossly normal Eyes:  no discharge. No scleral icterus.  Neck: No JVD, no carotid bruits  Cardiovascular: Regular rate and rhythm, no murmurs appreciated Pulmonary/Chest: Clear to auscultation bilaterally, no wheezes or rails Abdominal: Soft.  no distension.  no tenderness.  Musculoskeletal: Normal range of motion Neurological:  normal muscle tone. Coordination normal. No atrophy Skin: Skin warm and dry Psychiatric: normal affect, pleasant   Recent Labs: No results found for requested labs within last 8760 hours.    Lipid Panel No results found for: CHOL, HDL, LDLCALC, TRIG    Wt Readings from Last 3 Encounters:  03/26/18 152 lb 8 oz (69.2 kg)  06/05/17 155 lb (70.3 kg)  04/08/17 156 lb 12 oz (71.1 kg)       ASSESSMENT AND PLAN:  Renal artery stenosis (HCC) -  Managed by Dr. Ronalee Belts, occluded on left, medical management on right  Chronic kidney disease, stage IV Recent climb in creatinine and BUN Concern for prerenal state He is on Lasix daily for many years, on clear reasons Denies shortness of breath or leg swelling Normal ejection fraction with no indication of pulmonary  hypertension Recommended he hold the Lasix, repeat lab work when he sees nephrology end of the month.  Most likely can take Lasix as needed for ankle swelling Creatinine is trending above his baseline, most recently 3.7 with elevated BUN  Type 2 diabetes mellitus with stage 4 chronic kidney disease, with long-term current use of insulin (HCC) -  He has been losing weight Hemoglobin A1c stable 6.9  Mixed hyperlipidemia -  On a statin.  Recommend goal LDL less than 70  Essential hypertension -  Blood pressure is well controlled on today's visit. No changes made to the medications. As above recommended he hold his Lasix  Carotid bruit <39% b/l carotid disease on ultrasound January 2019  Aortic valve disease Mild aortic valve stenosis, no significant murmur appreciated  Disposition:   F/U  12 months   Total encounter time more than  25 minutes  Greater than 50% was spent in counseling and coordination of care with the patient    Orders Placed This Encounter  Procedures  . EKG 12-Lead     Signed, Esmond Plants, M.D., Ph.D. 03/26/2018  [place], [place]

## 2018-03-26 ENCOUNTER — Encounter: Payer: Self-pay | Admitting: Cardiovascular Disease

## 2018-03-26 ENCOUNTER — Ambulatory Visit: Payer: PPO | Admitting: Cardiovascular Disease

## 2018-03-26 VITALS — BP 150/74 | HR 53 | Ht 68.0 in | Wt 152.5 lb

## 2018-03-26 DIAGNOSIS — I359 Nonrheumatic aortic valve disorder, unspecified: Secondary | ICD-10-CM | POA: Diagnosis not present

## 2018-03-26 DIAGNOSIS — N184 Chronic kidney disease, stage 4 (severe): Secondary | ICD-10-CM | POA: Diagnosis not present

## 2018-03-26 DIAGNOSIS — I739 Peripheral vascular disease, unspecified: Secondary | ICD-10-CM

## 2018-03-26 DIAGNOSIS — E1122 Type 2 diabetes mellitus with diabetic chronic kidney disease: Secondary | ICD-10-CM

## 2018-03-26 DIAGNOSIS — Z794 Long term (current) use of insulin: Secondary | ICD-10-CM | POA: Diagnosis not present

## 2018-03-26 DIAGNOSIS — I779 Disorder of arteries and arterioles, unspecified: Secondary | ICD-10-CM

## 2018-03-26 DIAGNOSIS — I701 Atherosclerosis of renal artery: Secondary | ICD-10-CM

## 2018-03-26 DIAGNOSIS — I10 Essential (primary) hypertension: Secondary | ICD-10-CM | POA: Diagnosis not present

## 2018-03-26 DIAGNOSIS — E782 Mixed hyperlipidemia: Secondary | ICD-10-CM | POA: Diagnosis not present

## 2018-03-26 NOTE — Patient Instructions (Addendum)
Medication Instructions:   Hold the furosemide,  Take for leg swelling, shortness of breath  If you need a refill on your cardiac medications before your next appointment, please call your pharmacy.    Lab work: No new labs needed   If you have labs (blood work) drawn today and your tests are completely normal, you will receive your results only by: Marland Kitchen MyChart Message (if you have MyChart) OR . A paper copy in the mail If you have any lab test that is abnormal or we need to change your treatment, we will call you to review the results.   Testing/Procedures: No new testing needed   Follow-Up: At [facility], you and your health needs are our priority.  As part of our continuing mission to provide you with exceptional heart care, we have created designated Provider Care Teams.  These Care Teams include your primary Cardiologist (physician) and Advanced Practice Providers (APPs -  Physician Assistants and Nurse Practitioners) who all work together to provide you with the care you need, when you need it.  . You will need a follow up appointment in 12 months .   Please call our office 2 months in advance to schedule this appointment.    . Providers on your designated Care Team:   . Murray Hodgkins, NP . Christell Faith, PA-C . Marrianne Mood, PA-C  Any Other Special Instructions Will Be Listed Below (If Applicable).  For educational health videos Log in to : [email or web address] Or : [email or web address], password : [email or web address]

## 2018-04-08 DIAGNOSIS — N184 Chronic kidney disease, stage 4 (severe): Secondary | ICD-10-CM | POA: Diagnosis not present

## 2018-04-08 DIAGNOSIS — R809 Proteinuria, unspecified: Secondary | ICD-10-CM | POA: Diagnosis not present

## 2018-04-08 DIAGNOSIS — I129 Hypertensive chronic kidney disease with stage 1 through stage 4 chronic kidney disease, or unspecified chronic kidney disease: Secondary | ICD-10-CM | POA: Diagnosis not present

## 2018-04-08 DIAGNOSIS — R319 Hematuria, unspecified: Secondary | ICD-10-CM | POA: Diagnosis not present

## 2018-04-15 DIAGNOSIS — R809 Proteinuria, unspecified: Secondary | ICD-10-CM | POA: Diagnosis not present

## 2018-04-15 DIAGNOSIS — I129 Hypertensive chronic kidney disease with stage 1 through stage 4 chronic kidney disease, or unspecified chronic kidney disease: Secondary | ICD-10-CM | POA: Diagnosis not present

## 2018-04-15 DIAGNOSIS — N184 Chronic kidney disease, stage 4 (severe): Secondary | ICD-10-CM | POA: Diagnosis not present

## 2018-04-15 DIAGNOSIS — M109 Gout, unspecified: Secondary | ICD-10-CM | POA: Diagnosis not present

## 2018-04-15 DIAGNOSIS — E875 Hyperkalemia: Secondary | ICD-10-CM | POA: Diagnosis not present

## 2018-05-08 DIAGNOSIS — I10 Essential (primary) hypertension: Secondary | ICD-10-CM | POA: Diagnosis not present

## 2018-05-08 DIAGNOSIS — E7800 Pure hypercholesterolemia, unspecified: Secondary | ICD-10-CM | POA: Diagnosis not present

## 2018-05-08 DIAGNOSIS — Z79899 Other long term (current) drug therapy: Secondary | ICD-10-CM | POA: Diagnosis not present

## 2018-05-14 DIAGNOSIS — E118 Type 2 diabetes mellitus with unspecified complications: Secondary | ICD-10-CM | POA: Diagnosis not present

## 2018-05-14 DIAGNOSIS — E7800 Pure hypercholesterolemia, unspecified: Secondary | ICD-10-CM | POA: Diagnosis not present

## 2018-05-14 DIAGNOSIS — N184 Chronic kidney disease, stage 4 (severe): Secondary | ICD-10-CM | POA: Diagnosis not present

## 2018-05-14 DIAGNOSIS — Z79899 Other long term (current) drug therapy: Secondary | ICD-10-CM | POA: Diagnosis not present

## 2018-05-14 DIAGNOSIS — I10 Essential (primary) hypertension: Secondary | ICD-10-CM | POA: Diagnosis not present

## 2018-06-02 DIAGNOSIS — N184 Chronic kidney disease, stage 4 (severe): Secondary | ICD-10-CM | POA: Diagnosis not present

## 2018-06-02 DIAGNOSIS — I129 Hypertensive chronic kidney disease with stage 1 through stage 4 chronic kidney disease, or unspecified chronic kidney disease: Secondary | ICD-10-CM | POA: Diagnosis not present

## 2018-06-02 DIAGNOSIS — E785 Hyperlipidemia, unspecified: Secondary | ICD-10-CM | POA: Diagnosis not present

## 2018-06-02 DIAGNOSIS — E1122 Type 2 diabetes mellitus with diabetic chronic kidney disease: Secondary | ICD-10-CM | POA: Diagnosis not present

## 2018-06-08 ENCOUNTER — Encounter (INDEPENDENT_AMBULATORY_CARE_PROVIDER_SITE_OTHER): Payer: PPO

## 2018-06-08 ENCOUNTER — Ambulatory Visit (INDEPENDENT_AMBULATORY_CARE_PROVIDER_SITE_OTHER): Payer: PPO | Admitting: Vascular Surgery

## 2018-06-10 DIAGNOSIS — N184 Chronic kidney disease, stage 4 (severe): Secondary | ICD-10-CM | POA: Diagnosis not present

## 2018-06-10 DIAGNOSIS — E1122 Type 2 diabetes mellitus with diabetic chronic kidney disease: Secondary | ICD-10-CM | POA: Diagnosis not present

## 2018-06-10 DIAGNOSIS — I129 Hypertensive chronic kidney disease with stage 1 through stage 4 chronic kidney disease, or unspecified chronic kidney disease: Secondary | ICD-10-CM | POA: Diagnosis not present

## 2018-06-10 DIAGNOSIS — I10 Essential (primary) hypertension: Secondary | ICD-10-CM | POA: Diagnosis not present

## 2018-06-10 DIAGNOSIS — E7800 Pure hypercholesterolemia, unspecified: Secondary | ICD-10-CM | POA: Diagnosis not present

## 2018-06-10 DIAGNOSIS — E785 Hyperlipidemia, unspecified: Secondary | ICD-10-CM | POA: Diagnosis not present

## 2018-06-11 ENCOUNTER — Ambulatory Visit (INDEPENDENT_AMBULATORY_CARE_PROVIDER_SITE_OTHER): Payer: PPO | Admitting: Vascular Surgery

## 2018-06-11 ENCOUNTER — Encounter (INDEPENDENT_AMBULATORY_CARE_PROVIDER_SITE_OTHER): Payer: Self-pay | Admitting: Vascular Surgery

## 2018-06-11 ENCOUNTER — Ambulatory Visit (INDEPENDENT_AMBULATORY_CARE_PROVIDER_SITE_OTHER): Payer: PPO

## 2018-06-11 VITALS — BP 222/78 | HR 72 | Resp 16 | Ht 68.0 in | Wt 153.8 lb

## 2018-06-11 DIAGNOSIS — N184 Chronic kidney disease, stage 4 (severe): Secondary | ICD-10-CM | POA: Diagnosis not present

## 2018-06-11 DIAGNOSIS — I10 Essential (primary) hypertension: Secondary | ICD-10-CM

## 2018-06-11 DIAGNOSIS — Z794 Long term (current) use of insulin: Secondary | ICD-10-CM

## 2018-06-11 DIAGNOSIS — E1122 Type 2 diabetes mellitus with diabetic chronic kidney disease: Secondary | ICD-10-CM | POA: Diagnosis not present

## 2018-06-11 DIAGNOSIS — E782 Mixed hyperlipidemia: Secondary | ICD-10-CM | POA: Diagnosis not present

## 2018-06-11 DIAGNOSIS — Z87891 Personal history of nicotine dependence: Secondary | ICD-10-CM | POA: Diagnosis not present

## 2018-06-11 DIAGNOSIS — I701 Atherosclerosis of renal artery: Secondary | ICD-10-CM | POA: Diagnosis not present

## 2018-06-11 DIAGNOSIS — I129 Hypertensive chronic kidney disease with stage 1 through stage 4 chronic kidney disease, or unspecified chronic kidney disease: Secondary | ICD-10-CM

## 2018-06-11 NOTE — Progress Notes (Signed)
MRN : [id number]  Dylan Sanders is a 85 y.o. (03/26/1932) male who presents with chief complaint of  Chief Complaint  Patient presents with  . Follow-up  .  History of Present Illness:   The patient returns to the office for followup and review of the noninvasive studies regarding renal vascular hypertension and renal artery stenosis. There have been no interval changes in the patient's blood pressure control.  He denies any major changes in is medications.  The patient denies headache or flushing.  No flank or unusual back pain.    There have been no significant changes to the patient's overall health care.  No interval shortening of the patient's walking distance or new symptoms consistent with claudication.  The patient denies the  development of rest pain symptoms. No new ulcers or wounds have occurred since the last visit.  The patient denies amaurosis fugax or recent TIA symptoms. There are no recent neurological changes noted. The patient denies history of DVT, PE or superficial thrombophlebitis. The patient denies recent episodes of angina or shortness of breath.   Renal duplex shows moderate to severe disease at the right renal artery origin.     Current Meds  Medication Sig  . acetaminophen (TYLENOL) 500 MG tablet Take 500 mg by mouth every 6 (six) hours as needed for mild pain.  Marland Kitchen allopurinol (ZYLOPRIM) 100 MG tablet Take 100 mg by mouth daily.   Marland Kitchen aspirin EC 81 MG tablet Take 81 mg by mouth at bedtime.   . Blood Glucose Monitoring Suppl (ONE TOUCH ULTRA 2) w/Device KIT USE TO CHECK SUGARS TWICE DAILY  . carvedilol (COREG) 3.125 MG tablet Take 3.125 mg by mouth daily.   Marland Kitchen LANTUS SOLOSTAR 100 UNIT/ML Solostar Pen Inject 15 Units into the skin at bedtime.   Marland Kitchen LOKELMA 10 g PACK packet MIX AND DRINK ONE PACKET DISSOLVED IN WATER ONCE DAILY  . simvastatin (ZOCOR) 20 MG tablet Take 20 mg by mouth daily.   Marland Kitchen telmisartan (MICARDIS) 40 MG tablet Take by mouth daily.  .  verapamil (CALAN-SR) 240 MG CR tablet Take 240 mg by mouth daily.    Past Medical History:  Diagnosis Date  . Cancer ([place])    colon  . Chronic kidney disease   . Diabetes mellitus without complication ([place])   . Hypertension     Past Surgical History:  Procedure Laterality Date  . COLON SURGERY    . EYE SURGERY     bilateral  . RENAL ANGIOGRAPHY Bilateral 11/19/2016   Procedure: Renal Angiography;  Surgeon: Katha Cabal, MD;  Location: [place] [facility];  Service: Cardiovascular;  Laterality: Bilateral;  . REVERSE SHOULDER ARTHROPLASTY Left 09/19/2016   Procedure: REVERSE SHOULDER ARTHROPLASTY;  Surgeon: Corky Mull, MD;  Location: [facility];  Service: Orthopedics;  Laterality: Left;  . ROTATOR CUFF REPAIR Right     Social History Social History   Tobacco Use  . Smoking status: Former Smoker    Last attempt to quit: 09/05/1976    Years since quitting: 41.7  . Smokeless tobacco: Never Used  Substance Use Topics  . Alcohol use: Yes    Comment: ocassionally  . Drug use: No    Family History Family History  Problem Relation Age of Onset  . Cancer Father     No Known Allergies   REVIEW OF SYSTEMS (Negative unless checked)  Constitutional: _0 Weight loss  _1 Fever  _2 Chills Cardiac: _3 Chest pain   _4 Chest pressure   _5 Palpitations   _6   Shortness of breath when laying flat   _0 Shortness of breath with exertion. Vascular:  _1 Pain in legs with walking   _2 Pain in legs at rest  _3 History of DVT   _4 Phlebitis   _5 Swelling in legs   _6 Varicose veins   _7 Non-healing ulcers Pulmonary:   _8 Uses home oxygen   _9 Productive cough   _10 Hemoptysis   _11 Wheeze  _12 COPD   _13 Asthma Neurologic:  _14 Dizziness   _15 Seizures   _16 History of stroke   _17 History of TIA  _18 Aphasia   _19 Vissual changes   _20 Weakness or numbness in arm   _21 Weakness or numbness in leg Musculoskeletal:   _22 Joint swelling   _23 Joint pain   _24 Low back pain Hematologic:  _25 Easy bruising  _26 Easy bleeding    _27 Hypercoagulable state   _28 Anemic Gastrointestinal:  _29 Diarrhea   _30 Vomiting  _31 Gastroesophageal reflux/heartburn   _32 Difficulty swallowing. Genitourinary:  _33 Chronic kidney disease   _34 Difficult urination  _35 Frequent urination   _36 Blood in urine Skin:  _37 Rashes   _38 Ulcers  Psychological:  _39 History of anxiety   _40  History of major depression.  Physical Examination  Vitals:   06/11/18 1034  BP: (!) 222/78  Pulse: 72  Resp: 16  Weight: 153 lb 12.8 oz (69.8 kg)  Height: _41  (1.727 m)   Body mass index is 23.39 kg/m. Gen: WD/WN, NAD Head: [place]/AT, No temporalis wasting.  Ear/Nose/Throat: Hearing grossly intact, nares w/o erythema or drainage Eyes: PER, EOMI, sclera nonicteric.  Neck: Supple, no large masses.   Pulmonary:  Good air movement, no audible wheezing bilaterally, no use of accessory muscles.  Cardiac: RRR, no JVD Vascular:  Vessel Right Left  Radial Palpable Palpable  Brachial Palpable Palpable  Carotid Palpable Palpable  PT Palpable Palpable  DP Palpable Palpable  Gastrointestinal: Non-distended. No guarding/no peritoneal signs.  Musculoskeletal: M/S 5/5 throughout.  No deformity or atrophy.  Neurologic: CN 2-12 intact. Symmetrical.  Speech is fluent. Motor exam as listed above. Psychiatric: Judgment intact, Mood & affect appropriate for pt's clinical situation. Dermatologic: No rashes or ulcers noted.  No changes consistent with cellulitis. Lymph : No lichenification or skin changes of chronic lymphedema.  CBC Lab Results  Component Value Date   WBC 14.1 (H) 09/20/2016   HGB 11.4 (L) 09/20/2016   HCT 33.6 (L) 09/20/2016   MCV 96.4 09/20/2016   PLT 167 09/20/2016    BMET    Component Value Date/Time   NA 137 09/20/2016 0438   NA 141 06/19/2011 1343   K 5.0 09/20/2016 0438   K 4.1 06/19/2011 1343   CL 110 09/20/2016 0438   CL 102 06/19/2011 1343   CO2 20 (L) 09/20/2016 0438   CO2 32 06/19/2011 1343   GLUCOSE 185 (H) 09/20/2016 0438   GLUCOSE 193  (H) 06/19/2011 1343   BUN 45 (H) 11/19/2016 0714   BUN 26 (H) 06/19/2011 1343   CREATININE 2.56 (H) 11/19/2016 0714   CREATININE 2.43 (H) 06/19/2011 1343   CALCIUM 8.4 (L) 09/20/2016 0438   CALCIUM 8.9 06/19/2011 1343   GFRNONAA 22 (L) 11/19/2016 0714   GFRNONAA 28 (L) 06/19/2011 1343   GFRAA 25 (L) 11/19/2016 0714   GFRAA 33 (L) 06/19/2011 1343   CrCl cannot be calculated (Patient's most recent lab result is older than the maximum 21 days allowed.).  COAG Lab Results  Component Value Date   INR 1.08 09/05/2016    Radiology No results found.   Assessment/Plan 1. Renal artery stenosis (HCC) Recommend:  The patient has evidence of moderate to severe atherosclerotic  changes of the renal artery with occlusion of the left renal artery associated with severe hypertension.  Systolic BP in the office was >200 mmHg.  This represents a high risk for CVA, MI and renal failure.  I have communicated with Dr Juleen China and Dr Doy Hutching.  Dr Juleen China feels that his hypertension has not responded to changes in hi medication and thet his renal function is worse.  He also agrees with right renal artery intervention.  Patient should undergo angiography of the right renal artery with the hope for intervention for control of hypertension.  The risks and benefits as well as the alternative therapies was discussed in detail with the patient.  All questions were answered.  Patient agrees to proceed with angiography.    A total of 30 minutes was spent with this patient and greater than 50% was spent in counseling and coordination of care with the patient.  Discussion included the treatment options for vascular disease including indications for surgery and intervention.  Also discussed is the appropriate timing of treatment.  In addition medical therapy was discussed.  2. Essential hypertension Continue antihypertensive medications as already ordered, these medications have been reviewed and there are no changes  at this time.   3. Type 2 diabetes mellitus with stage 4 chronic kidney disease, with long-term current use of insulin (HCC) Continue hypoglycemic medications as already ordered, these medications have been reviewed and there are no changes at this time.  Hgb A1C to be monitored as already arranged by primary service   4. Mixed hyperlipidemia Continue statin as ordered and reviewed, no changes at this time     Hortencia Pilar, MD  06/11/2018 10:51 AM

## 2018-06-14 ENCOUNTER — Encounter (INDEPENDENT_AMBULATORY_CARE_PROVIDER_SITE_OTHER): Payer: Self-pay | Admitting: Vascular Surgery

## 2018-06-18 ENCOUNTER — Ambulatory Visit (INDEPENDENT_AMBULATORY_CARE_PROVIDER_SITE_OTHER): Payer: PPO | Admitting: Vascular Surgery

## 2018-06-19 ENCOUNTER — Ambulatory Visit
Admission: RE | Admit: 2018-06-19 | Discharge: 2018-06-19 | Disposition: A | Discharge status: Home / Self Care | Payer: PPO | Source: Ambulatory Visit | Attending: Nephrology | Admitting: Nephrology

## 2018-06-19 ENCOUNTER — Ambulatory Visit
Admission: RE | Admit: 2018-06-19 | Discharge: 2018-06-19 | Disposition: A | Discharge status: Home / Self Care | Payer: PPO | Attending: Nephrology | Admitting: Nephrology

## 2018-06-19 ENCOUNTER — Other Ambulatory Visit: Payer: Self-pay | Admitting: Nephrology

## 2018-06-19 DIAGNOSIS — N2581 Secondary hyperparathyroidism of renal origin: Secondary | ICD-10-CM | POA: Diagnosis not present

## 2018-06-19 DIAGNOSIS — N184 Chronic kidney disease, stage 4 (severe): Secondary | ICD-10-CM | POA: Diagnosis not present

## 2018-06-19 DIAGNOSIS — R809 Proteinuria, unspecified: Secondary | ICD-10-CM | POA: Diagnosis not present

## 2018-06-19 DIAGNOSIS — R079 Chest pain, unspecified: Secondary | ICD-10-CM | POA: Insufficient documentation

## 2018-06-19 DIAGNOSIS — E1122 Type 2 diabetes mellitus with diabetic chronic kidney disease: Secondary | ICD-10-CM | POA: Diagnosis not present

## 2018-06-19 DIAGNOSIS — D631 Anemia in chronic kidney disease: Secondary | ICD-10-CM | POA: Diagnosis not present

## 2018-06-19 DIAGNOSIS — I129 Hypertensive chronic kidney disease with stage 1 through stage 4 chronic kidney disease, or unspecified chronic kidney disease: Secondary | ICD-10-CM | POA: Diagnosis not present

## 2018-06-25 ENCOUNTER — Encounter (INDEPENDENT_AMBULATORY_CARE_PROVIDER_SITE_OTHER): Payer: Self-pay | Admitting: Vascular Surgery

## 2018-06-25 ENCOUNTER — Ambulatory Visit (INDEPENDENT_AMBULATORY_CARE_PROVIDER_SITE_OTHER): Payer: PPO | Admitting: Vascular Surgery

## 2018-06-25 ENCOUNTER — Other Ambulatory Visit: Payer: Self-pay

## 2018-06-25 VITALS — BP 162/65 | HR 79 | Resp 16 | Ht 68.0 in | Wt 150.0 lb

## 2018-06-25 DIAGNOSIS — N184 Chronic kidney disease, stage 4 (severe): Secondary | ICD-10-CM

## 2018-06-25 DIAGNOSIS — Z794 Long term (current) use of insulin: Secondary | ICD-10-CM

## 2018-06-25 DIAGNOSIS — Z87891 Personal history of nicotine dependence: Secondary | ICD-10-CM | POA: Diagnosis not present

## 2018-06-25 DIAGNOSIS — E1122 Type 2 diabetes mellitus with diabetic chronic kidney disease: Secondary | ICD-10-CM

## 2018-06-25 DIAGNOSIS — E782 Mixed hyperlipidemia: Secondary | ICD-10-CM | POA: Diagnosis not present

## 2018-06-25 DIAGNOSIS — I129 Hypertensive chronic kidney disease with stage 1 through stage 4 chronic kidney disease, or unspecified chronic kidney disease: Secondary | ICD-10-CM | POA: Diagnosis not present

## 2018-06-25 DIAGNOSIS — I701 Atherosclerosis of renal artery: Secondary | ICD-10-CM

## 2018-06-25 DIAGNOSIS — I10 Essential (primary) hypertension: Secondary | ICD-10-CM

## 2018-06-25 NOTE — Progress Notes (Signed)
MRN : [id number]  Dylan Sanders is a 85 y.o. (1931/09/05) male who presents with chief complaint of  Chief Complaint  Patient presents with  . Follow-up    1 week f/u  .  History of Present Illness:   The patient returns to the office for followup and review of the noninvasive studies regarding renal vascular hypertension and renal artery stenosis. There have been no interval changes in the patient's blood pressure control. He denies any major changes in is medications. The patient denies headache or flushing. No flank or unusual back pain.   There have been no significant changes to the patient's overall health care.  No interval shortening of the patient's walking distance or new symptoms consistent with claudication. The patient denies the development of rest pain symptoms. No new ulcers or wounds have occurred since the last visit.  The patient denies amaurosis fugax or recent TIA symptoms. There are no recent neurological changes noted. The patient denies history of DVT, PE or superficial thrombophlebitis. The patient denies recent episodes of angina or shortness of breath.  Renal duplex shows moderate to severe disease at the right renal artery origin.  Current Meds  Medication Sig  . acetaminophen (TYLENOL) 500 MG tablet Take 500 mg by mouth every 6 (six) hours as needed for mild pain.  Marland Kitchen allopurinol (ZYLOPRIM) 100 MG tablet Take 100 mg by mouth daily.   Marland Kitchen aspirin EC 81 MG tablet Take 81 mg by mouth at bedtime.   . Blood Glucose Monitoring Suppl (ONE TOUCH ULTRA 2) w/Device KIT USE TO CHECK SUGARS TWICE DAILY  . carvedilol (COREG) 3.125 MG tablet Take 3.125 mg by mouth daily.   Marland Kitchen LANTUS SOLOSTAR 100 UNIT/ML Solostar Pen Inject 15 Units into the skin at bedtime.   Marland Kitchen LOKELMA 10 g PACK packet MIX AND DRINK ONE PACKET DISSOLVED IN WATER ONCE DAILY  . simvastatin (ZOCOR) 20 MG tablet Take 20 mg by mouth daily.   Marland Kitchen telmisartan (MICARDIS) 40 MG tablet Take by mouth  daily.  . verapamil (CALAN-SR) 240 MG CR tablet Take 240 mg by mouth daily.    Past Medical History:  Diagnosis Date  . Cancer ([place])    colon  . Chronic kidney disease   . Diabetes mellitus without complication ([place])   . Hypertension     Past Surgical History:  Procedure Laterality Date  . COLON SURGERY    . EYE SURGERY     bilateral  . RENAL ANGIOGRAPHY Bilateral 11/19/2016   Procedure: Renal Angiography;  Surgeon: Katha Cabal, MD;  Location: [facility];  Service: Cardiovascular;  Laterality: Bilateral;  . REVERSE SHOULDER ARTHROPLASTY Left 09/19/2016   Procedure: REVERSE SHOULDER ARTHROPLASTY;  Surgeon: Corky Mull, MD;  Location: [facility];  Service: Orthopedics;  Laterality: Left;  . ROTATOR CUFF REPAIR Right     Social History Social History   Tobacco Use  . Smoking status: Former Smoker    Last attempt to quit: 09/05/1976    Years since quitting: 41.8  . Smokeless tobacco: Never Used  Substance Use Topics  . Alcohol use: Yes    Comment: ocassionally  . Drug use: No    Family History Family History  Problem Relation Age of Onset  . Cancer Father     No Known Allergies   REVIEW OF SYSTEMS (Negative unless checked)  Constitutional: _0 Weight loss  _1 Fever  _2 Chills Cardiac: _3 Chest pain   _4 Chest pressure   _5 Palpitations   _6 Shortness of breath  when laying flat   _0 Shortness of breath with exertion. Vascular:  _1 Pain in legs with walking   _2 Pain in legs at rest  _3 History of DVT   _4 Phlebitis   _5 Swelling in legs   _6 Varicose veins   _7 Non-healing ulcers Pulmonary:   _8 Uses home oxygen   _9 Productive cough   _10 Hemoptysis   _11 Wheeze  _12 COPD   _13 Asthma Neurologic:  _14 Dizziness   _15 Seizures   _16 History of stroke   _17 History of TIA  _18 Aphasia   _19 Vissual changes   _20 Weakness or numbness in arm   _21 Weakness or numbness in leg Musculoskeletal:   _22 Joint swelling   _23 Joint pain   _24 Low back pain Hematologic:  _25 Easy bruising  _26 Easy bleeding    _27 Hypercoagulable state   _28 Anemic Gastrointestinal:  _29 Diarrhea   _30 Vomiting  _31 Gastroesophageal reflux/heartburn   _32 Difficulty swallowing. Genitourinary:  _33 Chronic kidney disease   _34 Difficult urination  _35 Frequent urination   _36 Blood in urine Skin:  _37 Rashes   _38 Ulcers  Psychological:  _39 History of anxiety   _40  History of major depression.  Physical Examination  Vitals:   06/25/18 1517  BP: (!) 162/65  Pulse: 79  Resp: 16  Weight: 150 lb (68 kg)  Height: _41  (1.727 m)   Body mass index is 22.81 kg/m. Gen: WD/WN, NAD Head: [place]/AT, No temporalis wasting.  Ear/Nose/Throat: Hearing grossly intact, nares w/o erythema or drainage Eyes: PER, EOMI, sclera nonicteric.  Neck: Supple, no large masses.   Pulmonary:  Good air movement, no audible wheezing bilaterally, no use of accessory muscles.  Cardiac: RRR, no JVD Vascular:  Vessel Right Left  Radial Palpable Palpable  Gastrointestinal: Non-distended. No guarding/no peritoneal signs.  Musculoskeletal: M/S 5/5 throughout.  No deformity or atrophy.  Neurologic: CN 2-12 intact. Symmetrical.  Speech is fluent. Motor exam as listed above. Psychiatric: Judgment intact, Mood & affect appropriate for pt's clinical situation. Dermatologic: No rashes or ulcers noted.  No changes consistent with cellulitis. Lymph : No lichenification or skin changes of chronic lymphedema.  CBC Lab Results  Component Value Date   WBC 14.1 (H) 09/20/2016   HGB 11.4 (L) 09/20/2016   HCT 33.6 (L) 09/20/2016   MCV 96.4 09/20/2016   PLT 167 09/20/2016    BMET    Component Value Date/Time   NA 137 09/20/2016 0438   NA 141 06/19/2011 1343   K 5.0 09/20/2016 0438   K 4.1 06/19/2011 1343   CL 110 09/20/2016 0438   CL 102 06/19/2011 1343   CO2 20 (L) 09/20/2016 0438   CO2 32 06/19/2011 1343   GLUCOSE 185 (H) 09/20/2016 0438   GLUCOSE 193 (H) 06/19/2011 1343   BUN 45 (H) 11/19/2016 0714   BUN 26 (H) 06/19/2011 1343   CREATININE 2.56 (H) 11/19/2016  0714   CREATININE 2.43 (H) 06/19/2011 1343   CALCIUM 8.4 (L) 09/20/2016 0438   CALCIUM 8.9 06/19/2011 1343   GFRNONAA 22 (L) 11/19/2016 0714   GFRNONAA 28 (L) 06/19/2011 1343   GFRAA 25 (L) 11/19/2016 0714   GFRAA 33 (L) 06/19/2011 1343   CrCl cannot be calculated (Patient's most recent lab result is older than the maximum 21 days allowed.).  COAG Lab Results  Component Value Date   INR 1.08 09/05/2016    Radiology Dg Chest 2 View  Result Date: 06/19/2018 CLINICAL DATA:  Left-sided chest pain EXAM: CHEST - 2 VIEW COMPARISON:  06/25/2017 FINDINGS: Normal heart size. Bilateral small pleural effusions and bibasilar atelectasis. Normal vascularity. Left total shoulder arthroplasty. No pneumothorax or pleural  effusion. IMPRESSION: Small bilateral pleural effusions and bibasilar atelectasis. Electronically Signed   By: Marybelle Killings M.D.   On: 06/19/2018 13:42   Vas US Renal Artery Duplex  Result Date: 06/11/2018 ABDOMINAL VISCERAL Limitations: Air/bowel gas. Comparison Study: 06/05/2017 Performing Technologist: Charlane Ferretti RT (R)(VS)  Examination Guidelines: A complete evaluation includes B-mode imaging, spectral Doppler, color Doppler, and power Doppler as needed of all accessible portions of each vessel. Bilateral testing is considered an integral part of a complete examination. Limited examinations for reoccurring indications may be performed as noted.  Duplex Findings: +------------------+--------+--------+-------+ Right Renal ArteryPSV cm/sEDV cm/sComment +------------------+--------+--------+-------+ Origin               98                   +------------------+--------+--------+-------+ Proximal            176                   +------------------+--------+--------+-------+ Mid                 102                   +------------------+--------+--------+-------+ Distal               58                   +------------------+--------+--------+-------+  +-----------------+--------+--------+-------------------------+ Left Renal ArteryPSV cm/sEDV cm/s         Comment          +-----------------+--------+--------+-------------------------+ Distal              87           Possible accessory artery +-----------------+--------+--------+-------------------------+ Technologist observations Renal Artery(s):Small left kidney with collaterals.  +------------------+-----+------------------+----+ Right Kidney           Left Kidney            +------------------+-----+------------------+----+ RAR                    RAR                    +------------------+-----+------------------+----+ RAR (manual)           RAR (manual)           +------------------+-----+------------------+----+ Cortex                 Cortex                 +------------------+-----+------------------+----+ Cortex thickness       Corex thickness        +------------------+-----+------------------+----+ Kidney length (cm)10.80Kidney length (cm)5.20 +------------------+-----+------------------+----+  Summary: Renal:  Right: Normal size right kidney. Patent tortuous right renal artery.        No change from previous exam on 06/05/2017. Left:  Abnormal size for the left kidney. Flow noted in the kidney        possibly from an accessory or collateral artery. Mesenteric: Areas of limited visceral study include right renal artery and left renal artery.  *See table(s) above for measurements and observations.  Diagnosing physician: Hortencia Pilar MD  Electronically signed by Hortencia Pilar MD on 06/11/2018 at 5:47:44 PM.    Final      Assessment/Plan 1. Renal artery stenosis (HCC) Recommend:  The patient has evidence of severe atherosclerotic changes of the renal artery with worsening of the atherosclerosis of the renal arteries associated with severe hypertension.  This represents a  high risk for CVA, MI and renal failure.  I have spoken with Nephrology and they  feel we are at the point that if nothing is done he will end up on dialysis in short order.  Also his BP continues to be out of control.  Patient should undergo angiography of the renal artery with the hope for intervention for control of hypertension.  The risks and benefits as well as the alternative therapies was discussed in detail with the patient.  All questions were answered.  Patient agrees to proceed with angiography.    A total of 30 minutes was spent with this patient and greater than 50% was spent in counseling and coordination of care with the patient.  Discussion included the treatment options for vascular disease including indications for surgery and intervention.  Also discussed is the appropriate timing of treatment.  In addition medical therapy was discussed.   2. Malignant hypertension See Number 1   Continue antihypertensive medications as already ordered, these medications have been reviewed and there are no changes at this time.   3. Type 2 diabetes mellitus with stage 4 chronic kidney disease, with long-term current use of insulin (HCC) Continue hypoglycemic medications as already ordered, these medications have been reviewed and there are no changes at this time.  Hgb A1C to be monitored as already arranged by primary service   4. Chronic renal impairment, stage 4 (severe) (HCC) See #1  5. Mixed hyperlipidemia Continue statin as ordered and reviewed, no changes at this time     Hortencia Pilar, MD  06/25/2018 3:58 PM

## 2018-06-26 ENCOUNTER — Encounter (INDEPENDENT_AMBULATORY_CARE_PROVIDER_SITE_OTHER): Payer: Self-pay

## 2018-06-26 ENCOUNTER — Telehealth (INDEPENDENT_AMBULATORY_CARE_PROVIDER_SITE_OTHER): Payer: Self-pay

## 2018-06-26 NOTE — Telephone Encounter (Signed)
Spoke with the patient and went over pre-procedure instructions as well as the information will be mailed out to the patient.

## 2018-06-28 ENCOUNTER — Encounter (INDEPENDENT_AMBULATORY_CARE_PROVIDER_SITE_OTHER): Payer: Self-pay | Admitting: Vascular Surgery

## 2018-06-29 ENCOUNTER — Other Ambulatory Visit (INDEPENDENT_AMBULATORY_CARE_PROVIDER_SITE_OTHER): Payer: Self-pay | Admitting: Vascular Surgery

## 2018-06-30 DIAGNOSIS — E1142 Type 2 diabetes mellitus with diabetic polyneuropathy: Secondary | ICD-10-CM | POA: Diagnosis not present

## 2018-06-30 DIAGNOSIS — Z79899 Other long term (current) drug therapy: Secondary | ICD-10-CM | POA: Diagnosis not present

## 2018-07-02 MED ORDER — CEFAZOLIN SODIUM-DEXTROSE 2-4 GM/100ML-% IV SOLN
2.0000 g | Freq: Once | INTRAVENOUS | Status: AC
  Administered 2018-07-03: 2 g via INTRAVENOUS

## 2018-07-03 ENCOUNTER — Ambulatory Visit
Admission: RE | Admit: 2018-07-03 | Discharge: 2018-07-03 | Disposition: A | Discharge status: Home / Self Care | Payer: PPO | Attending: Vascular Surgery | Admitting: Vascular Surgery

## 2018-07-03 ENCOUNTER — Encounter
Admission: RE | Disposition: A | Discharge status: Home / Self Care | Payer: Self-pay | Source: Home / Self Care | Attending: Vascular Surgery

## 2018-07-03 DIAGNOSIS — N184 Chronic kidney disease, stage 4 (severe): Secondary | ICD-10-CM | POA: Insufficient documentation

## 2018-07-03 DIAGNOSIS — E782 Mixed hyperlipidemia: Secondary | ICD-10-CM | POA: Insufficient documentation

## 2018-07-03 DIAGNOSIS — Z87891 Personal history of nicotine dependence: Secondary | ICD-10-CM | POA: Insufficient documentation

## 2018-07-03 DIAGNOSIS — Z7982 Long term (current) use of aspirin: Secondary | ICD-10-CM | POA: Diagnosis not present

## 2018-07-03 DIAGNOSIS — I150 Renovascular hypertension: Secondary | ICD-10-CM | POA: Insufficient documentation

## 2018-07-03 DIAGNOSIS — Z794 Long term (current) use of insulin: Secondary | ICD-10-CM | POA: Diagnosis not present

## 2018-07-03 DIAGNOSIS — E1122 Type 2 diabetes mellitus with diabetic chronic kidney disease: Secondary | ICD-10-CM | POA: Insufficient documentation

## 2018-07-03 DIAGNOSIS — Z79899 Other long term (current) drug therapy: Secondary | ICD-10-CM | POA: Insufficient documentation

## 2018-07-03 DIAGNOSIS — I701 Atherosclerosis of renal artery: Secondary | ICD-10-CM | POA: Diagnosis not present

## 2018-07-03 HISTORY — PX: RENAL ANGIOGRAPHY: CATH118260

## 2018-07-03 LAB — BASIC METABOLIC PANEL
Anion gap: 8 (ref 5–15)
BUN: 39 mg/dL — ABNORMAL HIGH (ref 8–23)
CO2: 23 mmol/L (ref 22–32)
Calcium: 8.7 mg/dL — ABNORMAL LOW (ref 8.9–10.3)
Chloride: 111 mmol/L (ref 98–111)
Creatinine, Ser: 3.31 mg/dL — ABNORMAL HIGH (ref 0.61–1.24)
GFR calc Af Amer: 19 mL/min — ABNORMAL LOW (ref >60)
GFR, EST NON AFRICAN AMERICAN: 16 mL/min — AB (ref >60)
Glucose, Bld: 46 mg/dL — ABNORMAL LOW (ref 70–99)
Potassium: 3.7 mmol/L (ref 3.5–5.1)
Sodium: 142 mmol/L (ref 135–145)

## 2018-07-03 LAB — GLUCOSE, CAPILLARY
GLUCOSE-CAPILLARY: 38 mg/dL — AB (ref 70–99)
Glucose-Capillary: 126 mg/dL — ABNORMAL HIGH (ref 70–99)
Glucose-Capillary: 68 mg/dL — ABNORMAL LOW (ref 70–99)
Glucose-Capillary: 162 mg/dL — ABNORMAL HIGH (ref 70–99)

## 2018-07-03 SURGERY — RENAL ANGIOGRAPHY
Anesthesia: Moderate Sedation | Laterality: Right

## 2018-07-03 MED ORDER — CEFAZOLIN SODIUM-DEXTROSE 2-4 GM/100ML-% IV SOLN
INTRAVENOUS | Status: AC
  Administered 2018-07-03: 2 g via INTRAVENOUS
  Filled 2018-07-03: qty 100

## 2018-07-03 MED ORDER — CLOPIDOGREL BISULFATE 75 MG PO TABS: 75.0000 mg | ORAL_TABLET | Freq: Every day | ORAL | 4 refills | Status: DC

## 2018-07-03 MED ORDER — HEPARIN (PORCINE) IN NACL 1000-0.9 UT/500ML-% IV SOLN
INTRAVENOUS | Status: AC
  Filled 2018-07-03: qty 1000

## 2018-07-03 MED ORDER — SODIUM CHLORIDE 0.9% FLUSH: 3.0000 mL | INTRAVENOUS | Status: DC | PRN

## 2018-07-03 MED ORDER — ACETAMINOPHEN 325 MG PO TABS: 650.0000 mg | ORAL_TABLET | ORAL | Status: DC | PRN

## 2018-07-03 MED ORDER — FAMOTIDINE 20 MG PO TABS: 40.0000 mg | ORAL_TABLET | ORAL | Status: DC | PRN

## 2018-07-03 MED ORDER — SODIUM CHLORIDE 0.9 % IV BOLUS: 150.0000 mL | Freq: Once | INTRAVENOUS | Status: DC

## 2018-07-03 MED ORDER — MORPHINE SULFATE (PF) 4 MG/ML IV SOLN: 2.0000 mg | INTRAVENOUS | Status: DC | PRN

## 2018-07-03 MED ORDER — DEXTROSE 50 % IV SOLN
50.0000 mL | Freq: Once | INTRAVENOUS | Status: AC
  Administered 2018-07-03: 50 mL via INTRAVENOUS

## 2018-07-03 MED ORDER — HYDRALAZINE HCL 20 MG/ML IJ SOLN: 5.0000 mg | INTRAMUSCULAR | Status: DC | PRN

## 2018-07-03 MED ORDER — FENTANYL CITRATE (PF) 100 MCG/2ML IJ SOLN
INTRAMUSCULAR | Status: DC | PRN
  Administered 2018-07-03: 50 ug via INTRAVENOUS

## 2018-07-03 MED ORDER — SODIUM CHLORIDE 0.9 % IV SOLN: INTRAVENOUS | Status: DC

## 2018-07-03 MED ORDER — DIPHENHYDRAMINE HCL 50 MG/ML IJ SOLN: 50.0000 mg | Freq: Once | INTRAMUSCULAR | Status: DC

## 2018-07-03 MED ORDER — DEXTROSE 50 % IV SOLN
INTRAVENOUS | Status: AC
  Administered 2018-07-03: 50 mL via INTRAVENOUS
  Filled 2018-07-03: qty 50

## 2018-07-03 MED ORDER — CLOPIDOGREL BISULFATE 300 MG PO TABS
300.0000 mg | ORAL_TABLET | ORAL | Status: AC
  Administered 2018-07-03: 300 mg via ORAL

## 2018-07-03 MED ORDER — METHYLPREDNISOLONE SODIUM SUCC 125 MG IJ SOLR: 125.0000 mg | INTRAMUSCULAR | Status: DC | PRN

## 2018-07-03 MED ORDER — FENTANYL CITRATE (PF) 100 MCG/2ML IJ SOLN
INTRAMUSCULAR | Status: AC
  Filled 2018-07-03: qty 2

## 2018-07-03 MED ORDER — LABETALOL HCL 5 MG/ML IV SOLN: 10.0000 mg | INTRAVENOUS | Status: DC | PRN

## 2018-07-03 MED ORDER — ONDANSETRON HCL 4 MG/2ML IJ SOLN: 4.0000 mg | Freq: Four times a day (QID) | INTRAMUSCULAR | Status: DC | PRN

## 2018-07-03 MED ORDER — HEPARIN SODIUM (PORCINE) 1000 UNIT/ML IJ SOLN
INTRAMUSCULAR | Status: DC | PRN
  Administered 2018-07-03: 5000 [IU] via INTRAVENOUS

## 2018-07-03 MED ORDER — DEXTROSE 50 % IV SOLN
1.0000 | Freq: Once | INTRAVENOUS | Status: AC
  Administered 2018-07-03: 50 mL via INTRAVENOUS

## 2018-07-03 MED ORDER — LIDOCAINE HCL (PF) 1 % IJ SOLN
INTRAMUSCULAR | Status: AC
  Filled 2018-07-03: qty 30

## 2018-07-03 MED ORDER — SODIUM CHLORIDE 0.9 % IV SOLN
INTRAVENOUS | Status: DC
  Administered 2018-07-03: 09:00:00 via INTRAVENOUS

## 2018-07-03 MED ORDER — CLOPIDOGREL BISULFATE 75 MG PO TABS
ORAL_TABLET | ORAL | Status: AC
  Filled 2018-07-03: qty 4

## 2018-07-03 MED ORDER — HYDROMORPHONE HCL 1 MG/ML IJ SOLN: 1.0000 mg | Freq: Once | INTRAMUSCULAR | Status: DC | PRN

## 2018-07-03 MED ORDER — DEXTROSE 50 % IV SOLN
INTRAVENOUS | Status: AC
  Filled 2018-07-03: qty 50

## 2018-07-03 MED ORDER — HEPARIN SODIUM (PORCINE) 1000 UNIT/ML IJ SOLN
INTRAMUSCULAR | Status: AC
  Filled 2018-07-03: qty 1

## 2018-07-03 MED ORDER — OXYCODONE HCL 5 MG PO TABS: 5.0000 mg | ORAL_TABLET | ORAL | Status: DC | PRN

## 2018-07-03 MED ORDER — SODIUM CHLORIDE 0.9% FLUSH: 3.0000 mL | Freq: Two times a day (BID) | INTRAVENOUS | Status: DC

## 2018-07-03 MED ORDER — SODIUM CHLORIDE 0.9 % IV SOLN: 250.0000 mL | INTRAVENOUS | Status: DC | PRN

## 2018-07-03 MED ORDER — MIDAZOLAM HCL 2 MG/2ML IJ SOLN
INTRAMUSCULAR | Status: DC | PRN
  Administered 2018-07-03: 2 mg via INTRAVENOUS

## 2018-07-03 MED ORDER — MIDAZOLAM HCL 5 MG/5ML IJ SOLN
INTRAMUSCULAR | Status: AC
  Filled 2018-07-03: qty 5

## 2018-07-03 MED ORDER — MIDAZOLAM HCL 2 MG/ML PO SYRP: 8.0000 mg | ORAL_SOLUTION | Freq: Once | ORAL | Status: DC | PRN

## 2018-07-03 SURGICAL SUPPLY — 16 items
CATH PIG 70CM (CATHETERS) ×3 IMPLANT
CATH VS15FR (CATHETERS) ×3 IMPLANT
COVER PROBE U/S 5X48 (MISCELLANEOUS) IMPLANT
DEVICE PRESTO INFLATION (MISCELLANEOUS) ×3 IMPLANT
DEVICE STARCLOSE SE CLOSURE (Vascular Products) ×3 IMPLANT
DEVICE TORQUE .025-.038 (MISCELLANEOUS) ×3 IMPLANT
GUIDEWIRE ANGLED .035 180CM (WIRE) ×3 IMPLANT
NEEDLE ENTRY 21GA 7CM ECHOTIP (NEEDLE) ×3 IMPLANT
PACK ANGIOGRAPHY (CUSTOM PROCEDURE TRAY) ×3 IMPLANT
SET INTRO CAPELLA COAXIAL (SET/KITS/TRAYS/PACK) ×3 IMPLANT
SHEATH BRITE TIP 5FRX11 (SHEATH) ×3 IMPLANT
SHEATH BRITE TIP 6FRX11 (SHEATH) ×3 IMPLANT
SHEATH DESTIN RDC 6FR 45 (SHEATH) ×3 IMPLANT
STENT LIFESTREAM 6X16X80 (Permanent Stent) ×3 IMPLANT
WIRE J 3MM .035X145CM (WIRE) ×3 IMPLANT
WIRE MAGIC TOR.035 180C (WIRE) ×3 IMPLANT

## 2018-07-03 NOTE — H&P (Signed)
[facility] History & Physical Update  The patient was interviewed and re-examined.  The patient's previous History and Physical has been reviewed and is unchanged.  There is no change in the plan of care. We plan to proceed with the scheduled procedure.  Hortencia Pilar, MD  07/03/2018, 11:21 AM

## 2018-07-03 NOTE — Discharge Instructions (Signed)
Femoral Site Care °Refer to this sheet in the next few weeks. These instructions provide you with information about caring for yourself after your procedure. Your health care provider may also give you more specific instructions. Your treatment has been planned according to current medical practices, but problems sometimes occur. Call your health care provider if you have any problems or questions after your procedure. °What can I expect after the procedure? °After your procedure, it is typical to have the following: °· Bruising at the site that usually fades within 1-2 weeks. °· Blood collecting in the tissue (hematoma) that may be painful to the touch. It should usually decrease in size and tenderness within 1-2 weeks. ° °Follow these instructions at home: ° °· Take medicines only as directed by your health care provider.  If you take Metformin, hold for 48hours after your procedure. ° °The x-ray dye causes you to pass a considerate amount of urine.  For this reason, you will be asked to drink plenty of liquids after the procedure to prevent dehydration.  You may resume you regular diet.  Avoid caffeine products.   °· You may shower 24 hours after procedure. Leave the bandage on your access site for.  You may wash around your dressing but do not rub the site, this may cause bleeding.  Pat the area dry with a clean towel. After 48hours remove bandage and leave open to air. °· Do not take baths, swim, or use a hot tub for 7 days. °· Check your insertion site every day for redness, swelling, or drainage. °· If you lose feeling or develop tingling or pain in your leg or foot after the procedure, please walk around first.  If the discomfort does not improve , contact your physician and proceed to the nearest emergency room.  Loss of feeling in your leg might mean that a blockage has formed in the artery and this can be appropriately treated.  Limit your activity for the next two days after your procedure.  Avoid  stooping, bending, heavy lifting or exertion as this may put pressure on the insertion site.  Resume normal activities in 48 hours.   °check the insertion site occasionally.  If any oozing occurs or there is apparent swelling, firm pressure over the site will prevent a bruise from forming.  You can not hurt anything by pressing directly on the site.  The pressure stops the bleeding by allowing a small clot to form.  If the bleeding continues after the pressure has been applied for more than 15 minutes, call 911 or go to the nearest emergency room.   ° °· Apply pressure to access site if you have to laugh, cough, or sneeze. °· Do not apply powder or lotion to the site. °· Limit use of stairs to twice a day for the first 2-3 days or as directed by your health care provider. °· Do not squat for the first 2-3 days or as directed by your health care provider. °· Do not lift over 10 lb (4.5 kg) for 5 days after your procedure or as directed by your health care provider. °· Ask your health care provider when it is okay to: °? Return to work or school. °? Resume usual physical activities or sports. °? Resume sexual activity. °· Do not drive home if you are discharged the same day as the procedure. Have someone else drive you. °· You may drive 48 hours after the procedure unless otherwise instructed by your health care   provider.  Do not operate machinery or power tools for 24 hours after the procedure or as directed by your health care provider.  If your procedure was done as an outpatient procedure, which means that you went home the same day as your procedure, a responsible adult should be with you for the first 24 hours after you arrive home.  Keep all follow-up visits as directed by your health care provider. This is important. Contact a health care provider if:  You have a fever.  You have chills.  You have increased bleeding from the site. Hold pressure on the site. Get help right away if:  You have  unusual pain at the site.  You have redness, warmth, or swelling at the site.  You have drainage (other than a small amount of blood on the dressing) from the site.  The site is bleeding, and the bleeding does not stop after 30 minutes of holding steady pressure on the site.  Your leg or foot becomes pale, cool, tingly, or numb. This information is not intended to replace advice given to you by your health care provider. Make sure you discuss any questions you have with your health care provider. Document Released: 12/31/2013 Document Revised: 10/05/2015 Document Reviewed: 11/16/2013 Elsevier Interactive Patient Education  2018 [employer]Moderate Conscious Sedation, Adult, Care After These instructions provide you with information about caring for yourself after your procedure. Your health care provider may also give you more specific instructions. Your treatment has been planned according to current medical practices, but problems sometimes occur. Call your health care provider if you have any problems or questions after your procedure. What can I expect after the procedure? After your procedure, it is common:  To feel sleepy for several hours.  To feel clumsy and have poor balance for several hours.  To have poor judgment for several hours.  To vomit if you eat too soon. Follow these instructions at home: For at least 24 hours after the procedure:   Do not: ? Participate in activities where you could fall or become injured. ? Drive. ? Use heavy machinery. ? Drink alcohol. ? Take sleeping pills or medicines that cause drowsiness. ? Make important decisions or sign legal documents. ? Take care of children on your own.  Rest. Eating and drinking  Follow the diet recommended by your health care provider.  If you vomit: ? Drink water, juice, or soup when you can drink without vomiting. ? Make sure you have little or no nausea before eating solid foods. General  instructions  Have a responsible adult stay with you until you are awake and alert.  Take over-the-counter and prescription medicines only as told by your health care provider.  If you smoke, do not smoke without supervision.  Keep all follow-up visits as told by your health care provider. This is important. Contact a health care provider if:  You keep feeling nauseous or you keep vomiting.  You feel light-headed.  You develop a rash.  You have a fever. Get help right away if:  You have trouble breathing. This information is not intended to replace advice given to you by your health care provider. Make sure you discuss any questions you have with your health care provider. Document Released: 02/17/2013 Document Revised: 10/02/2015 Document Reviewed: 08/19/2015 Elsevier Interactive Patient Education  2019 [employer]. Femoral Site Care This sheet gives you information about how to care for yourself after your procedure. Your health care provider may also give you more  specific instructions. If you have problems or questions, contact your health care provider. What can I expect after the procedure? After the procedure, it is common to have:  Bruising that usually fades within 1-2 weeks.  Tenderness at the site. Follow these instructions at home: Wound care  Follow instructions from your health care provider about how to take care of your insertion site. Make sure you: ? Wash your hands with soap and water before you change your bandage (dressing). If soap and water are not available, use hand sanitizer. ? Change your dressing as told by your health care provider. ? Leave stitches (sutures), skin glue, or adhesive strips in place. These skin closures may need to stay in place for 2 weeks or longer. If adhesive strip edges start to loosen and curl up, you may trim the loose edges. Do not remove adhesive strips completely unless your health care provider tells you to do that.  Do  not take baths, swim, or use a hot tub until your health care provider approves.  You may shower 24-48 hours after the procedure or as told by your health care provider. ? Gently wash the site with plain soap and water. ? Pat the area dry with a clean towel. ? Do not rub the site. This may cause bleeding.  Do not apply powder or lotion to the site. Keep the site clean and dry.  Check your femoral site every day for signs of infection. Check for: ? Redness, swelling, or pain. ? Fluid or blood. ? Warmth. ? Pus or a bad smell. Activity  For the first 2-3 days after your procedure, or as long as directed: ? Avoid climbing stairs as much as possible. ? Do not squat.  Do not lift anything that is heavier than 10 lb (4.5 kg), or the limit that you are told, until your health care provider says that it is safe.  Rest as directed. ? Avoid sitting for a long time without moving. Get up to take short walks every 1-2 hours.  Do not drive for 24 hours if you were given a medicine to help you relax (sedative). General instructions  Take over-the-counter and prescription medicines only as told by your health care provider.  Keep all follow-up visits as told by your health care provider. This is important. Contact a health care provider if you have:  A fever or chills.  You have redness, swelling, or pain around your insertion site. Get help right away if:  The catheter insertion area swells very fast.  You pass out.  You suddenly start to sweat or your skin gets clammy.  The catheter insertion area is bleeding, and the bleeding does not stop when you hold steady pressure on the area.  The area near or just beyond the catheter insertion site becomes pale, cool, tingly, or numb. These symptoms may represent a serious problem that is an emergency. Do not wait to see if the symptoms will go away. Get medical help right away. Call your local emergency services (911 in the [place] Do not  drive yourself to the hospital. Summary  After the procedure, it is common to have bruising that usually fades within 1-2 weeks.  Check your femoral site every day for signs of infection.  Do not lift anything that is heavier than 10 lb (4.5 kg), or the limit that you are told, until your health care provider says that it is safe. This information is not intended to replace advice given to  you by your health care provider. Make sure you discuss any questions you have with your health care provider. Document Released: 12/31/2013 Document Revised: 05/12/2017 Document Reviewed: 05/12/2017 Elsevier Interactive Patient Education  2019 [employer].

## 2018-07-03 NOTE — Op Note (Signed)
[facility] Percutaneous Study/Intervention Procedural Note    Surgeon(s): Nurse, children's: None  Pre-operative Diagnosis: Right renal artery stenosis, renovascular hypertension; stage IV renal insufficiency  Post-operative diagnosis: Same  Procedure(s) Performed: 1. Ultrasound guidance for vascular access right femoral artery 2. Catheter placement into right renal artery from right femoral approach 3. Aortogram and selective right renal angiogram 4. Balloon expandable stent placement to the right renal artery with a 6 mm diameter x 16 mm length lifestream 5. StarClose closure device right femoral artery  Contrast: 45 cc  EBL: Less than 10  Fluoro Time: 5.5 minutes  Moderate conscious sedation: Approximately 40 minutes with 3 mg of Versed and 100 Mcg of Fentanyl.  Continuous ECG pulse oximetry and cardiopulmonary monitoring was performed throughout the entire procedure by the interventional radiology nurse total sedation time is 40 minutes.  Indications: The patient is a 85 year old male with worsening severe hypertension despite multiple medications as well as deteriorating renal function.  He has a known unilateral right kidney with worsening atherosclerotic changes at the origin.  The patient has suboptimal blood pressure control despite multiple antihypertensives and a noninvasive study demonstrating hemodynamically significant ostial stenosis of the right renal artery. Given the clinical scenario and the noninvasive findings, angiogram is indicated for further evaluation of her renal artery and potential treatment. Risks and benefits are discussed and informed consent is obtained.  Procedure: The patient was identified and appropriate procedural time out was performed. The patient was then placed supine on the table and prepped and draped in the usual sterile  fashion.Moderate conscious sedation was administered with a face to face encounter with the patient throughout the procedure with my supervision of the RN administering medicines and monitoring the patients vital signs and mental status throughout from the start of the procedure until the patient was taken to the recovery room  Ultrasound was used to evaluate the right common femoral artery. It was patent . A digital ultrasound image was acquired. A Seldinger needle was used to access the right common femoral artery under direct ultrasound guidance and a permanent image was performed. A 0.035 J wire was advanced without resistance and a 5Fr sheath was placed. Pigtail catheter was placed into the aorta at the L1 level and an AP aortogram was performed. This demonstrated greater than 70% stenosis. The patient was then systemically heparinized with 4000 units of intravenous heparin and 12-1/2 g of mannitol were given. I used a V S1 catheter to cannulate the right renal artery and selective imaging was performed. This confirmed a greater than 70% of the right renal artery.  At this point I selected the Magic torque wire and crossed the lesion without difficulty.   I then selected a 6 mm diameter x 16 mm length balloon expandable lifestream stent and brought this across the lesion.  This was deployed encompassing the lesion with its proximal extent going back into the aorta for a mm or two.  This was inflated to 12 ATM and the waist resolved.  Completion angiogram showed excellent result with preservation of distal runoff and less than 5% residual runoff.  The first 2 to 3 mm of the stent protruded into the aorta as desired.   The guide catheter was removed. Oblique arteriogram was performed of the right femoral artery and StarClose closure device was deployed in the usual fashion with excellent hemostatic result. The patient was taken to the recovery room in stable condition having tolerated the procedure  well.  Findings:  Aortogram/Renal Arteries:The abdominal aortogram is opacified with a bolus injection contrast.  There are no hemodynamically significant lesions within the aorta itself.  The left renal artery is completely nonvisualized consistent with the known absence of the left kidney.  The right nephrogram is normal size.  Selection of the right renal artery demonstrates a greater than 70% stenosis at the ostia.  No evidence of more distal disease.  Following angioplasty and stent placement there is less than 5% residual stenosis.  Summary: Successful angioplasty and stent placement right renal artery.   Condition:  Stable  Complications: None   Hortencia Pilar 07/03/2018 3:10 PM  This note was created with Dragon Medical transcription system. Any errors in dictation are purely unintentional.

## 2018-07-06 LAB — GLUCOSE, CAPILLARY: Glucose-Capillary: 35 mg/dL — CL (ref 70–99)

## 2018-07-07 ENCOUNTER — Encounter: Payer: Self-pay | Admitting: Vascular Surgery

## 2018-07-07 DIAGNOSIS — E7800 Pure hypercholesterolemia, unspecified: Secondary | ICD-10-CM | POA: Diagnosis not present

## 2018-07-07 DIAGNOSIS — Z794 Long term (current) use of insulin: Secondary | ICD-10-CM | POA: Diagnosis not present

## 2018-07-07 DIAGNOSIS — N184 Chronic kidney disease, stage 4 (severe): Secondary | ICD-10-CM | POA: Diagnosis not present

## 2018-07-07 DIAGNOSIS — I10 Essential (primary) hypertension: Secondary | ICD-10-CM | POA: Diagnosis not present

## 2018-07-07 DIAGNOSIS — E1122 Type 2 diabetes mellitus with diabetic chronic kidney disease: Secondary | ICD-10-CM | POA: Diagnosis not present

## 2018-07-07 DIAGNOSIS — Z0000 Encounter for general adult medical examination without abnormal findings: Secondary | ICD-10-CM | POA: Diagnosis not present

## 2018-07-07 DIAGNOSIS — E1142 Type 2 diabetes mellitus with diabetic polyneuropathy: Secondary | ICD-10-CM | POA: Diagnosis not present

## 2018-07-07 DIAGNOSIS — E118 Type 2 diabetes mellitus with unspecified complications: Secondary | ICD-10-CM | POA: Diagnosis not present

## 2018-07-08 ENCOUNTER — Telehealth (INDEPENDENT_AMBULATORY_CARE_PROVIDER_SITE_OTHER): Payer: Self-pay | Admitting: Vascular Surgery

## 2018-07-08 NOTE — Telephone Encounter (Signed)
Patient had Renal Angiography done on 07/03/18. Calling today asking if he can be cleared to bowl. He has a game on 07/13/18. Says he is feeling fine but wanted to double check.  Discharge summery only restriction no lifting x 1 week.  Please advise. AS, CMA  Per Arna Medici (verbal) patient should be fine to bowl.  Patient is aware of above. AS, CMA

## 2018-07-16 DIAGNOSIS — E785 Hyperlipidemia, unspecified: Secondary | ICD-10-CM | POA: Diagnosis not present

## 2018-07-16 DIAGNOSIS — N184 Chronic kidney disease, stage 4 (severe): Secondary | ICD-10-CM | POA: Diagnosis not present

## 2018-07-16 DIAGNOSIS — I129 Hypertensive chronic kidney disease with stage 1 through stage 4 chronic kidney disease, or unspecified chronic kidney disease: Secondary | ICD-10-CM | POA: Diagnosis not present

## 2018-07-16 DIAGNOSIS — E1122 Type 2 diabetes mellitus with diabetic chronic kidney disease: Secondary | ICD-10-CM | POA: Diagnosis not present

## 2018-07-20 ENCOUNTER — Encounter (INDEPENDENT_AMBULATORY_CARE_PROVIDER_SITE_OTHER): Payer: PPO

## 2018-07-20 ENCOUNTER — Ambulatory Visit (INDEPENDENT_AMBULATORY_CARE_PROVIDER_SITE_OTHER): Payer: PPO | Admitting: Vascular Surgery

## 2018-07-20 DIAGNOSIS — E875 Hyperkalemia: Secondary | ICD-10-CM | POA: Diagnosis not present

## 2018-07-20 DIAGNOSIS — I129 Hypertensive chronic kidney disease with stage 1 through stage 4 chronic kidney disease, or unspecified chronic kidney disease: Secondary | ICD-10-CM | POA: Diagnosis not present

## 2018-07-20 DIAGNOSIS — N184 Chronic kidney disease, stage 4 (severe): Secondary | ICD-10-CM | POA: Diagnosis not present

## 2018-07-20 DIAGNOSIS — R809 Proteinuria, unspecified: Secondary | ICD-10-CM | POA: Diagnosis not present

## 2018-07-30 ENCOUNTER — Other Ambulatory Visit: Payer: Self-pay

## 2018-07-30 ENCOUNTER — Ambulatory Visit (INDEPENDENT_AMBULATORY_CARE_PROVIDER_SITE_OTHER): Payer: PPO | Admitting: Vascular Surgery

## 2018-07-30 ENCOUNTER — Encounter (INDEPENDENT_AMBULATORY_CARE_PROVIDER_SITE_OTHER): Payer: Self-pay | Admitting: Vascular Surgery

## 2018-07-30 ENCOUNTER — Other Ambulatory Visit (INDEPENDENT_AMBULATORY_CARE_PROVIDER_SITE_OTHER): Payer: PPO

## 2018-07-30 ENCOUNTER — Other Ambulatory Visit (INDEPENDENT_AMBULATORY_CARE_PROVIDER_SITE_OTHER): Payer: Self-pay | Admitting: Vascular Surgery

## 2018-07-30 VITALS — BP 168/64 | HR 72 | Resp 16 | Ht 68.0 in | Wt 148.8 lb

## 2018-07-30 DIAGNOSIS — E782 Mixed hyperlipidemia: Secondary | ICD-10-CM

## 2018-07-30 DIAGNOSIS — I701 Atherosclerosis of renal artery: Secondary | ICD-10-CM | POA: Diagnosis not present

## 2018-07-30 DIAGNOSIS — N184 Chronic kidney disease, stage 4 (severe): Secondary | ICD-10-CM | POA: Diagnosis not present

## 2018-07-30 DIAGNOSIS — Z794 Long term (current) use of insulin: Secondary | ICD-10-CM

## 2018-07-30 DIAGNOSIS — I129 Hypertensive chronic kidney disease with stage 1 through stage 4 chronic kidney disease, or unspecified chronic kidney disease: Secondary | ICD-10-CM | POA: Diagnosis not present

## 2018-07-30 DIAGNOSIS — Z79899 Other long term (current) drug therapy: Secondary | ICD-10-CM

## 2018-07-30 DIAGNOSIS — E1122 Type 2 diabetes mellitus with diabetic chronic kidney disease: Secondary | ICD-10-CM | POA: Diagnosis not present

## 2018-07-30 DIAGNOSIS — I10 Essential (primary) hypertension: Secondary | ICD-10-CM

## 2018-07-30 DIAGNOSIS — E785 Hyperlipidemia, unspecified: Secondary | ICD-10-CM | POA: Diagnosis not present

## 2018-07-30 DIAGNOSIS — Z87891 Personal history of nicotine dependence: Secondary | ICD-10-CM

## 2018-07-30 NOTE — Progress Notes (Signed)
MRN : [id number]  Dylan Sanders is a 85 y.o. ([birth year]) male who presents with chief complaint of  Chief Complaint  Patient presents with  . Follow-up    [facility] 2week Renal  .  History of Present Illness:  The patient returns to the office for followup and review status post angiogram with intervention 07/03/2018.   BP has been much better  There have been no significant changes to the patient's overall health care.  The patient denies amaurosis fugax or recent TIA symptoms. There are no recent neurological changes noted. The patient denies history of DVT, PE or superficial thrombophlebitis. The patient denies recent episodes of angina or shortness of breath.     Current Meds  Medication Sig  . acetaminophen (TYLENOL) 500 MG tablet Take 500 mg by mouth every 6 (six) hours as needed for mild pain.  Marland Kitchen allopurinol (ZYLOPRIM) 100 MG tablet Take 100 mg by mouth daily.   Marland Kitchen amLODipine (NORVASC) 5 MG tablet Take 5 mg by mouth daily.  Marland Kitchen aspirin EC 81 MG tablet Take 81 mg by mouth at bedtime.   . Blood Glucose Monitoring Suppl (ONE TOUCH ULTRA 2) w/Device KIT USE TO CHECK SUGARS TWICE DAILY  . carvedilol (COREG) 3.125 MG tablet Take 3.125 mg by mouth daily.   . cloNIDine (CATAPRES) 0.1 MG tablet Take 0.1 mg by mouth 2 (two) times daily.   . clopidogrel (PLAVIX) 75 MG tablet Take 1 tablet (75 mg total) by mouth daily.  Marland Kitchen LANTUS SOLOSTAR 100 UNIT/ML Solostar Pen Inject 15 Units into the skin at bedtime.   Marland Kitchen LOKELMA 10 g PACK packet Take 10 g by mouth daily.   . simvastatin (ZOCOR) 80 MG tablet Take 80 mg by mouth daily.   Marland Kitchen telmisartan (MICARDIS) 80 MG tablet Take 80 mg by mouth daily.   . verapamil (VERELAN PM) 180 MG 24 hr capsule Take 180 mg by mouth daily.     Past Medical History:  Diagnosis Date  . Cancer ([place])    colon  . Chronic kidney disease   . Diabetes mellitus without complication ([place])   . Hypertension     Past Surgical History:  Procedure Laterality Date  .  COLON SURGERY    . EYE SURGERY     bilateral  . RENAL ANGIOGRAPHY Bilateral 11/19/2016   Procedure: Renal Angiography;  Surgeon: Katha Cabal, MD;  Location: [facility];  Service: Cardiovascular;  Laterality: Bilateral;  . RENAL ANGIOGRAPHY Right 07/03/2018   Procedure: RENAL ANGIOGRAPHY;  Surgeon: Katha Cabal, MD;  Location: [facility];  Service: Cardiovascular;  Laterality: Right;  . REVERSE SHOULDER ARTHROPLASTY Left 09/19/2016   Procedure: REVERSE SHOULDER ARTHROPLASTY;  Surgeon: Corky Mull, MD;  Location: [facility];  Service: Orthopedics;  Laterality: Left;  . ROTATOR CUFF REPAIR Right     Social History Social History   Tobacco Use  . Smoking status: Former Smoker    Last attempt to quit: 09/05/1976    Years since quitting: 41.9  . Smokeless tobacco: Never Used  Substance Use Topics  . Alcohol use: Yes    Comment: ocassionally  . Drug use: No    Family History Family History  Problem Relation Age of Onset  . Cancer Father     No Known Allergies   REVIEW OF SYSTEMS (Negative unless checked)  Constitutional: [] Weight loss  [] Fever  [] Chills Cardiac: [] Chest pain   [] Chest pressure   [] Palpitations   [] Shortness of breath when  laying flat   [] Shortness of breath with exertion. Vascular:  [] Pain in legs with walking   [] Pain in legs at rest  [] History of DVT   [] Phlebitis   [] Swelling in legs   [] Varicose veins   [] Non-healing ulcers Pulmonary:   [] Uses home oxygen   [] Productive cough   [] Hemoptysis   [] Wheeze  [] COPD   [] Asthma Neurologic:  [] Dizziness   [] Seizures   [] History of stroke   [] History of TIA  [] Aphasia   [] Vissual changes   [] Weakness or numbness in arm   [] Weakness or numbness in leg Musculoskeletal:   [] Joint swelling   [] Joint pain   [] Low back pain Hematologic:  [] Easy bruising  [] Easy bleeding   [] Hypercoagulable state   [] Anemic Gastrointestinal:  [] Diarrhea   [] Vomiting  [] Gastroesophageal reflux/heartburn    [] Difficulty swallowing. Genitourinary:  [] Chronic kidney disease   [] Difficult urination  [] Frequent urination   [] Blood in urine Skin:  [] Rashes   [] Ulcers  Psychological:  [] History of anxiety   []  History of major depression.  Physical Examination  Vitals:   07/30/18 0837  BP: (!) 168/64  Pulse: 72  Resp: 16  Weight: 148 lb 12.8 oz (67.5 kg)  Height: 5' 8"  (1.727 m)   Body mass index is 22.62 kg/m. Gen: WD/WN, NAD Head: [place]/AT, No temporalis wasting.  Ear/Nose/Throat: Hearing grossly intact, nares w/o erythema or drainage Eyes: PER, EOMI, sclera nonicteric.  Neck: Supple, no large masses.   Pulmonary:  Good air movement, no audible wheezing bilaterally, no use of accessory muscles.  Cardiac: RRR, no JVD Vascular:  Vessel Right Left  Radial Palpable Palpable  Gastrointestinal: Non-distended. No guarding/no peritoneal signs.  Musculoskeletal: M/S 5/5 throughout.  No deformity or atrophy.  Neurologic: CN 2-12 intact. Symmetrical.  Speech is fluent. Motor exam as listed above. Psychiatric: Judgment intact, Mood & affect appropriate for pt's clinical situation. Dermatologic: No rashes or ulcers noted.  No changes consistent with cellulitis. Lymph : No lichenification or skin changes of chronic lymphedema.  CBC Lab Results  Component Value Date   WBC 14.1 (H) 09/20/2016   HGB 11.4 (L) 09/20/2016   HCT 33.6 (L) 09/20/2016   MCV 96.4 09/20/2016   PLT 167 09/20/2016    BMET    Component Value Date/Time   NA 142 07/03/2018 0921   NA 141 06/19/2011 1343   K 3.7 07/03/2018 0921   K 4.1 06/19/2011 1343   CL 111 07/03/2018 0921   CL 102 06/19/2011 1343   CO2 23 07/03/2018 0921   CO2 32 06/19/2011 1343   GLUCOSE 46 (L) 07/03/2018 0921   GLUCOSE 193 (H) 06/19/2011 1343   BUN 39 (H) 07/03/2018 0921   BUN 26 (H) 06/19/2011 1343   CREATININE 3.31 (H) 07/03/2018 0921   CREATININE 2.43 (H) 06/19/2011 1343   CALCIUM 8.7 (L) 07/03/2018 0921   CALCIUM 8.9 06/19/2011 1343    GFRNONAA 16 (L) 07/03/2018 0921   GFRNONAA 28 (L) 06/19/2011 1343   GFRAA 19 (L) 07/03/2018 0921   GFRAA 33 (L) 06/19/2011 1343   CrCl cannot be calculated (Patient's most recent lab result is older than the maximum 21 days allowed.).  COAG Lab Results  Component Value Date   INR 1.08 09/05/2016    Radiology No results found.   Assessment/Plan 1. Renal artery stenosis (HCC) BP today was acceptable Given patient's atherosclerosis and PAD optimal control of the patient's hypertension is important.  The patient's BP and noninvasive studies support the previous intervention is patent. No further  intervention is indicated at this time.  Therefore the patient  will continue the current medications, no changes at this time.  The primary medical service will continue aggressive antihypertensive therapy as per the AHA guidelines   - VAS US RENAL ARTERY DUPLEX; Future  2. Malignant hypertension Continue antihypertensive medications as already ordered, these medications have been reviewed and there are no changes at this time.   3. Type 2 diabetes mellitus with stage 4 chronic kidney disease, with long-term current use of insulin (HCC) Continue hypoglycemic medications as already ordered, these medications have been reviewed and there are no changes at this time.  Hgb A1C to be monitored as already arranged by primary service   4. Mixed hyperlipidemia Continue statin as ordered and reviewed, no changes at this time     Hortencia Pilar, MD  07/30/2018 8:43 AM

## 2018-09-28 DIAGNOSIS — N184 Chronic kidney disease, stage 4 (severe): Secondary | ICD-10-CM | POA: Diagnosis not present

## 2018-09-28 DIAGNOSIS — E785 Hyperlipidemia, unspecified: Secondary | ICD-10-CM | POA: Diagnosis not present

## 2018-09-28 DIAGNOSIS — E1122 Type 2 diabetes mellitus with diabetic chronic kidney disease: Secondary | ICD-10-CM | POA: Diagnosis not present

## 2018-09-28 DIAGNOSIS — R809 Proteinuria, unspecified: Secondary | ICD-10-CM | POA: Diagnosis not present

## 2018-09-29 DIAGNOSIS — N185 Chronic kidney disease, stage 5: Secondary | ICD-10-CM | POA: Diagnosis not present

## 2018-09-29 DIAGNOSIS — I120 Hypertensive chronic kidney disease with stage 5 chronic kidney disease or end stage renal disease: Secondary | ICD-10-CM | POA: Diagnosis not present

## 2018-09-29 DIAGNOSIS — E875 Hyperkalemia: Secondary | ICD-10-CM | POA: Diagnosis not present

## 2018-09-29 DIAGNOSIS — N2581 Secondary hyperparathyroidism of renal origin: Secondary | ICD-10-CM | POA: Diagnosis not present

## 2018-10-01 ENCOUNTER — Other Ambulatory Visit (INDEPENDENT_AMBULATORY_CARE_PROVIDER_SITE_OTHER): Payer: Self-pay | Admitting: Vascular Surgery

## 2018-10-01 DIAGNOSIS — I701 Atherosclerosis of renal artery: Secondary | ICD-10-CM

## 2018-10-01 MED ORDER — CLOPIDOGREL BISULFATE 75 MG PO TABS: 75.0000 mg | ORAL_TABLET | Freq: Every day | ORAL | 4 refills | Status: DC

## 2018-11-03 DIAGNOSIS — Z794 Long term (current) use of insulin: Secondary | ICD-10-CM | POA: Diagnosis not present

## 2018-11-03 DIAGNOSIS — E1122 Type 2 diabetes mellitus with diabetic chronic kidney disease: Secondary | ICD-10-CM | POA: Diagnosis not present

## 2018-11-03 DIAGNOSIS — N184 Chronic kidney disease, stage 4 (severe): Secondary | ICD-10-CM | POA: Diagnosis not present

## 2018-11-10 DIAGNOSIS — I10 Essential (primary) hypertension: Secondary | ICD-10-CM | POA: Diagnosis not present

## 2018-11-10 DIAGNOSIS — E7800 Pure hypercholesterolemia, unspecified: Secondary | ICD-10-CM | POA: Diagnosis not present

## 2018-11-10 DIAGNOSIS — E1122 Type 2 diabetes mellitus with diabetic chronic kidney disease: Secondary | ICD-10-CM | POA: Diagnosis not present

## 2018-11-10 DIAGNOSIS — E1142 Type 2 diabetes mellitus with diabetic polyneuropathy: Secondary | ICD-10-CM | POA: Diagnosis not present

## 2018-11-10 DIAGNOSIS — E118 Type 2 diabetes mellitus with unspecified complications: Secondary | ICD-10-CM | POA: Diagnosis not present

## 2018-11-10 DIAGNOSIS — R001 Bradycardia, unspecified: Secondary | ICD-10-CM | POA: Diagnosis not present

## 2018-11-10 DIAGNOSIS — Z794 Long term (current) use of insulin: Secondary | ICD-10-CM | POA: Diagnosis not present

## 2018-11-10 DIAGNOSIS — N184 Chronic kidney disease, stage 4 (severe): Secondary | ICD-10-CM | POA: Diagnosis not present

## 2018-11-23 DIAGNOSIS — I129 Hypertensive chronic kidney disease with stage 1 through stage 4 chronic kidney disease, or unspecified chronic kidney disease: Secondary | ICD-10-CM | POA: Diagnosis not present

## 2018-11-23 DIAGNOSIS — N184 Chronic kidney disease, stage 4 (severe): Secondary | ICD-10-CM | POA: Diagnosis not present

## 2018-11-23 DIAGNOSIS — E785 Hyperlipidemia, unspecified: Secondary | ICD-10-CM | POA: Diagnosis not present

## 2018-11-23 DIAGNOSIS — E1122 Type 2 diabetes mellitus with diabetic chronic kidney disease: Secondary | ICD-10-CM | POA: Diagnosis not present

## 2018-11-30 DIAGNOSIS — N2581 Secondary hyperparathyroidism of renal origin: Secondary | ICD-10-CM | POA: Diagnosis not present

## 2018-11-30 DIAGNOSIS — R809 Proteinuria, unspecified: Secondary | ICD-10-CM | POA: Diagnosis not present

## 2018-11-30 DIAGNOSIS — N185 Chronic kidney disease, stage 5: Secondary | ICD-10-CM | POA: Diagnosis not present

## 2018-11-30 DIAGNOSIS — I120 Hypertensive chronic kidney disease with stage 5 chronic kidney disease or end stage renal disease: Secondary | ICD-10-CM | POA: Diagnosis not present

## 2018-11-30 DIAGNOSIS — D631 Anemia in chronic kidney disease: Secondary | ICD-10-CM | POA: Diagnosis not present

## 2018-12-10 ENCOUNTER — Other Ambulatory Visit (INDEPENDENT_AMBULATORY_CARE_PROVIDER_SITE_OTHER): Payer: Self-pay | Admitting: Vascular Surgery

## 2018-12-10 DIAGNOSIS — N185 Chronic kidney disease, stage 5: Secondary | ICD-10-CM

## 2018-12-14 ENCOUNTER — Other Ambulatory Visit: Payer: Self-pay

## 2018-12-14 ENCOUNTER — Ambulatory Visit (INDEPENDENT_AMBULATORY_CARE_PROVIDER_SITE_OTHER): Payer: PPO

## 2018-12-14 ENCOUNTER — Encounter (INDEPENDENT_AMBULATORY_CARE_PROVIDER_SITE_OTHER): Payer: Self-pay | Admitting: Nurse Practitioner

## 2018-12-14 ENCOUNTER — Ambulatory Visit (INDEPENDENT_AMBULATORY_CARE_PROVIDER_SITE_OTHER): Payer: PPO | Admitting: Nurse Practitioner

## 2018-12-14 VITALS — BP 170/56 | HR 64 | Resp 12 | Ht 68.0 in | Wt 144.0 lb

## 2018-12-14 DIAGNOSIS — E1122 Type 2 diabetes mellitus with diabetic chronic kidney disease: Secondary | ICD-10-CM | POA: Diagnosis not present

## 2018-12-14 DIAGNOSIS — Z794 Long term (current) use of insulin: Secondary | ICD-10-CM | POA: Diagnosis not present

## 2018-12-14 DIAGNOSIS — M5030 Other cervical disc degeneration, unspecified cervical region: Secondary | ICD-10-CM | POA: Diagnosis not present

## 2018-12-14 DIAGNOSIS — N185 Chronic kidney disease, stage 5: Secondary | ICD-10-CM | POA: Diagnosis not present

## 2018-12-14 DIAGNOSIS — E782 Mixed hyperlipidemia: Secondary | ICD-10-CM

## 2018-12-14 DIAGNOSIS — I10 Essential (primary) hypertension: Secondary | ICD-10-CM | POA: Insufficient documentation

## 2018-12-14 DIAGNOSIS — N184 Chronic kidney disease, stage 4 (severe): Secondary | ICD-10-CM | POA: Diagnosis not present

## 2018-12-14 NOTE — Progress Notes (Signed)
SUBJECTIVE:  Patient ID: Dylan Sanders, male    DOB: 1931/06/18, 85 y.o.   MRN: [id number] Chief Complaint  Patient presents with  . Follow-up    HPI  Dylan Sanders is a 85 y.o. male that presents today for evaluation for hemodialysis access.  Currently the patient has chronic kidney disease stage V and he has not yet begun dialysis.  The patient has had a previous history of renal artery.  His current GFR is 12.  The patient states that based off his discussions with his nephrologist he anticipates that he will begin dialysis prior to Christmas of this year.  Patient denies any uremic symptoms.  He denies any shortness of breath or signs or symptoms of volume overload.  He denies any chest pain or shortness of breath.  Has any TIA-like symptoms.  The patient has a history of neuropathy in his hands therefore he has constant numbness and tingling.  Noninvasive studies today shows anatomy adequate for a left brachial axillary graft insertion.  Past Medical History:  Diagnosis Date  . Cancer ([place])    colon  . Chronic kidney disease   . Diabetes mellitus without complication ([place])   . Hypertension     Past Surgical History:  Procedure Laterality Date  . COLON SURGERY    . EYE SURGERY     bilateral  . RENAL ANGIOGRAPHY Bilateral 11/19/2016   Procedure: Renal Angiography;  Surgeon: Katha Cabal, MD;  Location: [facility];  Service: Cardiovascular;  Laterality: Bilateral;  . RENAL ANGIOGRAPHY Right 07/03/2018   Procedure: RENAL ANGIOGRAPHY;  Surgeon: Katha Cabal, MD;  Location: [facility];  Service: Cardiovascular;  Laterality: Right;  . REVERSE SHOULDER ARTHROPLASTY Left 09/19/2016   Procedure: REVERSE SHOULDER ARTHROPLASTY;  Surgeon: Corky Mull, MD;  Location: [facility];  Service: Orthopedics;  Laterality: Left;  . ROTATOR CUFF REPAIR Right     Social History   Socioeconomic History  . Marital status: Widowed    Spouse name: Not on file  .  Number of children: Not on file  . Years of education: Not on file  . Highest education level: Not on file  Occupational History  . Not on file  Social Needs  . Financial resource strain: Not on file  . Food insecurity    Worry: Not on file    Inability: Not on file  . Transportation needs    Medical: Not on file    Non-medical: Not on file  Tobacco Use  . Smoking status: Former Smoker    Quit date: 09/05/1976    Years since quitting: 42.3  . Smokeless tobacco: Never Used  Substance and Sexual Activity  . Alcohol use: Yes    Comment: ocassionally  . Drug use: No  . Sexual activity: Never  Lifestyle  . Physical activity    Days per week: Not on file    Minutes per session: Not on file  . Stress: Not on file  Relationships  . Social Herbalist on phone: Not on file    Gets together: Not on file    Attends religious service: Not on file    Active member of club or organization: Not on file    Attends meetings of clubs or organizations: Not on file    Relationship status: Not on file  . Intimate partner violence    Fear of current or ex partner: Not on file    Emotionally abused: Not  on file    Physically abused: Not on file    Forced sexual activity: Not on file  Other Topics Concern  . Not on file  Social History Narrative  . Not on file    Family History  Problem Relation Age of Onset  . Cancer Father     No Known Allergies   Review of Systems   Review of Systems: Negative Unless Checked Constitutional: [] Weight loss  [] Fever  [] Chills Cardiac: [] Chest pain   []  Atrial Fibrillation  [] Palpitations   [] Shortness of breath when laying flat   [] Shortness of breath with exertion. [] Shortness of breath at rest Vascular:  [] Pain in legs with walking   [] Pain in legs with standing [] Pain in legs when laying flat   [] Claudication    [] Pain in feet when laying flat    [] History of DVT   [] Phlebitis   [] Swelling in legs   [] Varicose veins   [] Non-healing  ulcers Pulmonary:   [] Uses home oxygen   [] Productive cough   [] Hemoptysis   [] Wheeze  [] COPD   [] Asthma Neurologic:  [] Dizziness   [] Seizures  [] Blackouts [] History of stroke   [] History of TIA  [] Aphasia   [] Temporary Blindness   [] Weakness or numbness in arm   [] Weakness or numbness in leg Musculoskeletal:   [] Joint swelling   [] Joint pain   [] Low back pain  []  History of Knee Replacement [x] Arthritis [] back Surgeries  []  Spinal Stenosis    Hematologic:  [] Easy bruising  [] Easy bleeding   [] Hypercoagulable state   [x] Anemic Gastrointestinal:  [] Diarrhea   [] Vomiting  [] Gastroesophageal reflux/heartburn   [] Difficulty swallowing. [] Abdominal pain Genitourinary:  [x] Chronic kidney disease   [] Difficult urination  [] Anuric   [] Blood in urine [] Frequent urination  [] Burning with urination   [] Hematuria Skin:  [] Rashes   [] Ulcers [] Wounds Psychological:  [] History of anxiety   []  History of major depression  []  Memory Difficulties      OBJECTIVE:   Physical Exam  BP (!) 170/56 (BP Location: Left Arm, Patient Position: Sitting, Cuff Size: Normal)   Pulse 64   Resp 12   Ht 5' 8"  (1.727 m)   Wt 144 lb (65.3 kg)   BMI 21.90 kg/m   Gen: WD/WN, NAD Head: [place]/AT, No temporalis wasting.  Ear/Nose/Throat: Hearing grossly intact, nares w/o erythema or drainage Eyes: PER, EOMI, sclera nonicteric.  Neck: Supple, no masses.  No JVD.  Pulmonary:  Good air movement, no use of accessory muscles.  Cardiac: RRR Vascular:  Vessel Right Left  Radial Palpable Palpable   Gastrointestinal: soft, non-distended. No guarding/no peritoneal signs.  Musculoskeletal: M/S 5/5 throughout.  No deformity or atrophy.  Neurologic: Pain and light touch intact in extremities.  Symmetrical.  Speech is fluent. Motor exam as listed above. Psychiatric: Judgment intact, Mood & affect appropriate for pt's clinical situation. Dermatologic: No Venous rashes. No Ulcers Noted.  No changes consistent with cellulitis. Lymph : No  Cervical lymphadenopathy, no lichenification or skin changes of chronic lymphedema.       ASSESSMENT AND PLAN:  1. Chronic renal impairment, stage 4 (severe) (HCC) Recommend:  At this time the patient does not have appropriate extremity access for dialysis  Patient should have a left brachial axillary graft placed.  The risks, benefits and alternative therapies were reviewed in detail with the patient.  All questions were answered.  The patient agrees to proceed with surgery.    2. Mixed hyperlipidemia Continue statin as ordered and reviewed, no changes at this time  3. Type 2 diabetes mellitus with stage 4 chronic kidney disease, with long-term current use of insulin (HCC) Continue hypoglycemic medications as already ordered, these medications have been reviewed and there are no changes at this time.  Hgb A1C to be monitored as already arranged by primary service   4. DDD (degenerative disc disease), cervical Continue NSAID medications as already ordered, these medications have been reviewed and there are no changes at this time.  Continued activity and therapy was stressed.    Current Outpatient Medications on File Prior to Visit  Medication Sig Dispense Refill  . allopurinol (ZYLOPRIM) 100 MG tablet Take 100 mg by mouth daily.     Marland Kitchen amLODipine (NORVASC) 5 MG tablet Take 5 mg by mouth daily.    Marland Kitchen aspirin EC 81 MG tablet Take 81 mg by mouth at bedtime.     . carvedilol (COREG) 3.125 MG tablet Take 3.125 mg by mouth daily.     . cloNIDine (CATAPRES) 0.1 MG tablet Take 0.1 mg by mouth 2 (two) times daily.     . clopidogrel (PLAVIX) 75 MG tablet Take 1 tablet (75 mg total) by mouth daily. 30 tablet 4  . LANTUS SOLOSTAR 100 UNIT/ML Solostar Pen Inject 15 Units into the skin at bedtime.     . simvastatin (ZOCOR) 80 MG tablet Take 80 mg by mouth daily.     Marland Kitchen telmisartan (MICARDIS) 80 MG tablet Take 80 mg by mouth daily.     Marland Kitchen acetaminophen (TYLENOL) 500 MG tablet Take 500 mg  by mouth every 6 (six) hours as needed for mild pain.    . Blood Glucose Monitoring Suppl (ONE TOUCH ULTRA 2) w/Device KIT USE TO CHECK SUGARS TWICE DAILY    . LOKELMA 10 g PACK packet Take 10 g by mouth daily.     . verapamil (VERELAN PM) 180 MG 24 hr capsule Take 180 mg by mouth daily.      No current facility-administered medications on file prior to visit.     There are no Patient Instructions on file for this visit. No follow-ups on file.   Kris Hartmann, NP  This note was completed with Sales executive.  Any errors are purely unintentional.

## 2018-12-14 NOTE — Addendum Note (Signed)
Addended by: Kris Hartmann on: 12/14/2018 11:17 AM   Modules accepted: Level of Service

## 2018-12-15 ENCOUNTER — Telehealth (INDEPENDENT_AMBULATORY_CARE_PROVIDER_SITE_OTHER): Payer: Self-pay

## 2018-12-15 NOTE — Telephone Encounter (Signed)
Spoke with the patient to schedule surgery and he is now scheduled with Dr. Lucky Cowboy for 01/01/2019. Patient will do his pre-op on 12/25/2018 @ 11:00 and his Covid testing will be on 12/29/2018 between 12:30-2:30 pm at the [facility]. Pre-surgical instructions were discussed and this information will be mailed to the patient.

## 2018-12-24 ENCOUNTER — Other Ambulatory Visit (INDEPENDENT_AMBULATORY_CARE_PROVIDER_SITE_OTHER): Payer: Self-pay | Admitting: Nurse Practitioner

## 2018-12-24 DIAGNOSIS — E7800 Pure hypercholesterolemia, unspecified: Secondary | ICD-10-CM | POA: Diagnosis not present

## 2018-12-24 DIAGNOSIS — N184 Chronic kidney disease, stage 4 (severe): Secondary | ICD-10-CM | POA: Diagnosis not present

## 2018-12-24 DIAGNOSIS — I10 Essential (primary) hypertension: Secondary | ICD-10-CM | POA: Diagnosis not present

## 2018-12-24 DIAGNOSIS — E118 Type 2 diabetes mellitus with unspecified complications: Secondary | ICD-10-CM | POA: Diagnosis not present

## 2018-12-25 ENCOUNTER — Encounter
Admission: RE | Admit: 2018-12-25 | Discharge: 2018-12-25 | Disposition: A | Discharge status: Home / Self Care | Payer: PPO | Source: Ambulatory Visit | Attending: Vascular Surgery | Admitting: Vascular Surgery

## 2018-12-25 ENCOUNTER — Other Ambulatory Visit: Payer: Self-pay

## 2018-12-25 DIAGNOSIS — R001 Bradycardia, unspecified: Secondary | ICD-10-CM | POA: Diagnosis not present

## 2018-12-25 DIAGNOSIS — Z01818 Encounter for other preprocedural examination: Secondary | ICD-10-CM | POA: Diagnosis not present

## 2018-12-25 HISTORY — DX: Cardiac murmur, unspecified: R01.1

## 2018-12-25 HISTORY — DX: Anemia, unspecified: D64.9

## 2018-12-25 LAB — BASIC METABOLIC PANEL
Anion gap: 6 (ref 5–15)
BUN: 55 mg/dL — ABNORMAL HIGH (ref 8–23)
CO2: 24 mmol/L (ref 22–32)
Calcium: 8.9 mg/dL (ref 8.9–10.3)
Chloride: 112 mmol/L — ABNORMAL HIGH (ref 98–111)
Creatinine, Ser: 3.99 mg/dL — ABNORMAL HIGH (ref 0.61–1.24)
GFR calc Af Amer: 15 mL/min — ABNORMAL LOW (ref >60)
GFR calc non Af Amer: 13 mL/min — ABNORMAL LOW (ref >60)
Glucose, Bld: 97 mg/dL (ref 70–99)
Potassium: 5.2 mmol/L — ABNORMAL HIGH (ref 3.5–5.1)
Sodium: 142 mmol/L (ref 135–145)

## 2018-12-25 LAB — PROTIME-INR
INR: 1 (ref 0.8–1.2)
Prothrombin Time: 13.4 seconds (ref 11.4–15.2)

## 2018-12-25 LAB — CBC WITH DIFFERENTIAL/PLATELET
Abs Immature Granulocytes: 0.02 10*3/uL (ref 0.00–0.07)
Basophils Absolute: 0 10*3/uL (ref 0.0–0.1)
Basophils Relative: 0 %
Eosinophils Absolute: 0.3 10*3/uL (ref 0.0–0.5)
Eosinophils Relative: 5 %
HCT: 27.2 % — ABNORMAL LOW (ref 39.0–52.0)
Hemoglobin: 9 g/dL — ABNORMAL LOW (ref 13.0–17.0)
Immature Granulocytes: 0 %
Lymphocytes Relative: 14 %
Lymphs Abs: 0.8 10*3/uL (ref 0.7–4.0)
MCH: 33.3 pg (ref 26.0–34.0)
MCHC: 33.1 g/dL (ref 30.0–36.0)
MCV: 100.7 fL — ABNORMAL HIGH (ref 80.0–100.0)
Monocytes Absolute: 0.6 10*3/uL (ref 0.1–1.0)
Monocytes Relative: 9 %
Neutro Abs: 4.3 10*3/uL (ref 1.7–7.7)
Neutrophils Relative %: 72 %
Platelets: 195 10*3/uL (ref 150–400)
RBC: 2.7 MIL/uL — ABNORMAL LOW (ref 4.22–5.81)
RDW: 13.5 % (ref 11.5–15.5)
WBC: 6.1 10*3/uL (ref 4.0–10.5)
nRBC: 0 % (ref 0.0–0.2)

## 2018-12-25 LAB — APTT: aPTT: 29 seconds (ref 24–36)

## 2018-12-25 LAB — TYPE AND SCREEN
ABO/RH(D): O POS
Antibody Screen: NEGATIVE

## 2018-12-25 NOTE — Patient Instructions (Signed)
Your procedure is scheduled on: 01-01-19 FRIDAY Report to Same Day Surgery 2nd floor medical mall [facility] Entrance-take elevator on left to 2nd floor.  Check in with surgery information desk.) To find out your arrival time please call [phone] between 1PM - 3PM on 12-31-18 THURSDAY  Remember: Instructions that are not followed completely may result in serious medical risk, up to and including death, or upon the discretion of your surgeon and anesthesiologist your surgery may need to be rescheduled.    _x___ 1. Do not eat food after midnight the night before your procedure. NO GUM OR CANDY AFTER MIDNIGHT. You may drink WATER up to 2 hours before you are scheduled to arrive at the hospital for your procedure.  Do not drink WATER within 2 hours of your scheduled arrival to the hospital.  Type 1 and type 2 diabetics should only drink water-IF YOUR BLOOD SUGAR DROPS YOU MAY DRINK APPLE JUICE    ____Ensure clear carbohydrate drink on the way to the hospital for bariatric patients  ____Ensure clear carbohydrate drink 3 hours before surgery.     __x__ 2. No Alcohol for 24 hours before or after surgery.   __x__3. No Smoking or e-cigarettes for 24 prior to surgery.  Do not use any chewable tobacco products for at least 6 hour prior to surgery   ____  4. Bring all medications with you on the day of surgery if instructed.    __x__ 5. Notify your doctor if there is any change in your medical condition     (cold, fever, infections).    x___6. On the morning of surgery brush your teeth with toothpaste and water.  You may rinse your mouth with mouth wash if you wish.  Do not swallow any toothpaste or mouthwash.   Do not wear jewelry, make-up, hairpins, clips or nail polish.  Do not wear lotions, powders, or perfumes. You may wear deodorant.  Do not shave 48 hours prior to surgery. Men may shave face and neck.  Do not bring valuables to the hospital.    [facility] is not responsible for any  belongings or valuables.               Contacts, dentures or bridgework may not be worn into surgery.  Leave your suitcase in the car. After surgery it may be brought to your room.  For patients admitted to the hospital, discharge time is determined by your treatment team.  _  Patients discharged the day of surgery will not be allowed to drive home.  You will need someone to drive you home and stay with you the night of your procedure.    Please read over the following fact sheets that you were given:   [facility] Preparing for Surgery   _x___ TAKE THE FOLLOWING MEDICATION THE MORNING OF SURGERY WITH A SMALL SIP OF WATER. These include:  1. AMLODIPINE (NORVASC)  2. COREG (CARVEDILOL)  3.   4.  5.  6.  ____Fleets enema or Magnesium Citrate as directed.   _x___ Use CHG Soap or sage wipes as directed on instruction sheet   ____ Use inhalers on the day of surgery and bring to hospital day of surgery  ____ Stop Metformin and Janumet 2 days prior to surgery.    _X___ Take 1/2 of usual insulin dose the night before surgery and none on the morning surgery-DO NOT TAKE YOUR LANTUS AM OF SURGERY  _x___ Follow recommendations from Cardiologist, Pulmonologist or PCP regarding  stopping Aspirin, Coumadin, Plavix ,Eliquis, Effient, or Pradaxa, and Pletal-STOP YOUR PLAVIX 5 DAYS PRIOR TO SURGERY (LAST DOSE ON 8-15 Saturday) AND CONTINUE YOUR ASPIRIN   X____Stop Anti-inflammatories such as Advil, Aleve, Ibuprofen, Motrin, Naproxen, Naprosyn, Goodies powders or aspirin products NOW-OK to take Tylenol    ____ Stop supplements until after surgery.   ____ Bring C-Pap to the hospital.

## 2018-12-29 ENCOUNTER — Other Ambulatory Visit: Admission: RE | Admit: 2018-12-29 | Payer: PPO | Source: Ambulatory Visit

## 2018-12-29 ENCOUNTER — Encounter (INDEPENDENT_AMBULATORY_CARE_PROVIDER_SITE_OTHER): Payer: Self-pay

## 2018-12-31 ENCOUNTER — Other Ambulatory Visit (INDEPENDENT_AMBULATORY_CARE_PROVIDER_SITE_OTHER): Payer: Self-pay | Admitting: Nurse Practitioner

## 2019-01-01 ENCOUNTER — Other Ambulatory Visit: Admission: RE | Admit: 2019-01-01 | Payer: PPO | Source: Ambulatory Visit

## 2019-01-05 ENCOUNTER — Other Ambulatory Visit: Payer: Self-pay

## 2019-01-05 ENCOUNTER — Other Ambulatory Visit
Admission: RE | Admit: 2019-01-05 | Discharge: 2019-01-05 | Disposition: A | Discharge status: Home / Self Care | Payer: PPO | Source: Ambulatory Visit | Attending: Vascular Surgery | Admitting: Vascular Surgery

## 2019-01-05 DIAGNOSIS — Z01812 Encounter for preprocedural laboratory examination: Secondary | ICD-10-CM | POA: Diagnosis not present

## 2019-01-05 DIAGNOSIS — Z20828 Contact with and (suspected) exposure to other viral communicable diseases: Secondary | ICD-10-CM | POA: Insufficient documentation

## 2019-01-05 LAB — SARS CORONAVIRUS 2 (TAT 6-24 HRS): SARS Coronavirus 2: NEGATIVE

## 2019-01-08 ENCOUNTER — Ambulatory Visit: Payer: PPO

## 2019-01-08 ENCOUNTER — Encounter
Admission: RE | Disposition: A | Discharge status: Home / Self Care | Payer: Self-pay | Source: Ambulatory Visit | Attending: Vascular Surgery

## 2019-01-08 ENCOUNTER — Ambulatory Visit: Payer: PPO | Admitting: Certified Registered Nurse Anesthetist

## 2019-01-08 ENCOUNTER — Ambulatory Visit
Admission: RE | Admit: 2019-01-08 | Discharge: 2019-01-08 | Disposition: A | Discharge status: Home / Self Care | Payer: PPO | Source: Ambulatory Visit | Attending: Vascular Surgery | Admitting: Vascular Surgery

## 2019-01-08 DIAGNOSIS — M79632 Pain in left forearm: Secondary | ICD-10-CM | POA: Diagnosis not present

## 2019-01-08 DIAGNOSIS — E782 Mixed hyperlipidemia: Secondary | ICD-10-CM | POA: Diagnosis not present

## 2019-01-08 DIAGNOSIS — N184 Chronic kidney disease, stage 4 (severe): Secondary | ICD-10-CM | POA: Diagnosis not present

## 2019-01-08 DIAGNOSIS — N186 End stage renal disease: Secondary | ICD-10-CM | POA: Diagnosis not present

## 2019-01-08 DIAGNOSIS — Z794 Long term (current) use of insulin: Secondary | ICD-10-CM | POA: Diagnosis not present

## 2019-01-08 DIAGNOSIS — E785 Hyperlipidemia, unspecified: Secondary | ICD-10-CM | POA: Diagnosis not present

## 2019-01-08 DIAGNOSIS — I120 Hypertensive chronic kidney disease with stage 5 chronic kidney disease or end stage renal disease: Secondary | ICD-10-CM | POA: Insufficient documentation

## 2019-01-08 DIAGNOSIS — G8918 Other acute postprocedural pain: Secondary | ICD-10-CM | POA: Diagnosis not present

## 2019-01-08 DIAGNOSIS — Z87891 Personal history of nicotine dependence: Secondary | ICD-10-CM | POA: Diagnosis not present

## 2019-01-08 DIAGNOSIS — Z79899 Other long term (current) drug therapy: Secondary | ICD-10-CM | POA: Insufficient documentation

## 2019-01-08 DIAGNOSIS — D649 Anemia, unspecified: Secondary | ICD-10-CM | POA: Diagnosis not present

## 2019-01-08 DIAGNOSIS — E1142 Type 2 diabetes mellitus with diabetic polyneuropathy: Secondary | ICD-10-CM | POA: Insufficient documentation

## 2019-01-08 DIAGNOSIS — Z96612 Presence of left artificial shoulder joint: Secondary | ICD-10-CM | POA: Diagnosis not present

## 2019-01-08 DIAGNOSIS — E669 Obesity, unspecified: Secondary | ICD-10-CM | POA: Diagnosis not present

## 2019-01-08 DIAGNOSIS — Z7982 Long term (current) use of aspirin: Secondary | ICD-10-CM | POA: Insufficient documentation

## 2019-01-08 DIAGNOSIS — E119 Type 2 diabetes mellitus without complications: Secondary | ICD-10-CM | POA: Diagnosis not present

## 2019-01-08 DIAGNOSIS — M1990 Unspecified osteoarthritis, unspecified site: Secondary | ICD-10-CM | POA: Insufficient documentation

## 2019-01-08 DIAGNOSIS — Z85038 Personal history of other malignant neoplasm of large intestine: Secondary | ICD-10-CM | POA: Insufficient documentation

## 2019-01-08 DIAGNOSIS — E1122 Type 2 diabetes mellitus with diabetic chronic kidney disease: Secondary | ICD-10-CM | POA: Diagnosis not present

## 2019-01-08 DIAGNOSIS — Z7902 Long term (current) use of antithrombotics/antiplatelets: Secondary | ICD-10-CM | POA: Diagnosis not present

## 2019-01-08 DIAGNOSIS — N185 Chronic kidney disease, stage 5: Secondary | ICD-10-CM | POA: Diagnosis not present

## 2019-01-08 DIAGNOSIS — E7800 Pure hypercholesterolemia, unspecified: Secondary | ICD-10-CM | POA: Diagnosis not present

## 2019-01-08 DIAGNOSIS — I129 Hypertensive chronic kidney disease with stage 1 through stage 4 chronic kidney disease, or unspecified chronic kidney disease: Secondary | ICD-10-CM | POA: Diagnosis not present

## 2019-01-08 HISTORY — PX: AV FISTULA PLACEMENT: SHX1204

## 2019-01-08 LAB — POCT I-STAT 4, (NA,K, GLUC, HGB,HCT)
Glucose, Bld: 128 mg/dL — ABNORMAL HIGH (ref 70–99)
HCT: 29 % — ABNORMAL LOW (ref 39.0–52.0)
Hemoglobin: 9.9 g/dL — ABNORMAL LOW (ref 13.0–17.0)
Potassium: 4.1 mmol/L (ref 3.5–5.1)
Sodium: 140 mmol/L (ref 135–145)

## 2019-01-08 LAB — GLUCOSE, CAPILLARY
Glucose-Capillary: 130 mg/dL — ABNORMAL HIGH (ref 70–99)
Glucose-Capillary: 78 mg/dL (ref 70–99)

## 2019-01-08 SURGERY — INSERTION OF ARTERIOVENOUS (AV) GORE-TEX GRAFT ARM
Anesthesia: General | Laterality: Left

## 2019-01-08 MED ORDER — BUPIVACAINE HCL (PF) 0.5 % IJ SOLN: INTRAMUSCULAR | Status: DC | PRN

## 2019-01-08 MED ORDER — BUPIVACAINE HCL (PF) 0.5 % IJ SOLN
INTRAMUSCULAR | Status: AC
  Filled 2019-01-08: qty 30

## 2019-01-08 MED ORDER — FENTANYL CITRATE (PF) 100 MCG/2ML IJ SOLN
INTRAMUSCULAR | Status: AC
  Administered 2019-01-08: 08:00:00 50 ug via INTRAVENOUS
  Filled 2019-01-08: qty 2

## 2019-01-08 MED ORDER — LIDOCAINE HCL (PF) 2 % IJ SOLN
INTRAMUSCULAR | Status: AC
  Filled 2019-01-08: qty 10

## 2019-01-08 MED ORDER — ONDANSETRON HCL 4 MG/2ML IJ SOLN: 4.0000 mg | Freq: Once | INTRAMUSCULAR | Status: DC | PRN

## 2019-01-08 MED ORDER — BUPIVACAINE LIPOSOME 1.3 % IJ SUSP
INTRAMUSCULAR | Status: DC | PRN
  Administered 2019-01-08: 10:00:00 20 mL

## 2019-01-08 MED ORDER — EVICEL 2 ML EX KIT
PACK | CUTANEOUS | Status: DC | PRN
  Administered 2019-01-08: 2 mL

## 2019-01-08 MED ORDER — EPHEDRINE SULFATE 50 MG/ML IJ SOLN
INTRAMUSCULAR | Status: DC | PRN
  Administered 2019-01-08: 80 mg via INTRAVENOUS

## 2019-01-08 MED ORDER — CHLORHEXIDINE GLUCONATE CLOTH 2 % EX PADS: 6.0000 | MEDICATED_PAD | Freq: Once | CUTANEOUS | Status: DC

## 2019-01-08 MED ORDER — FAMOTIDINE 20 MG PO TABS
ORAL_TABLET | ORAL | Status: AC
  Administered 2019-01-08: 07:00:00 20 mg via ORAL
  Filled 2019-01-08: qty 1

## 2019-01-08 MED ORDER — BUPIVACAINE LIPOSOME 1.3 % IJ SUSP
INTRAMUSCULAR | Status: AC
  Filled 2019-01-08: qty 20

## 2019-01-08 MED ORDER — CEFAZOLIN SODIUM-DEXTROSE 1-4 GM/50ML-% IV SOLN
INTRAVENOUS | Status: AC
  Filled 2019-01-08: qty 50

## 2019-01-08 MED ORDER — EVICEL 2 ML EX KIT
PACK | CUTANEOUS | Status: AC
  Filled 2019-01-08: qty 1

## 2019-01-08 MED ORDER — MIDAZOLAM HCL 2 MG/2ML IJ SOLN
INTRAMUSCULAR | Status: AC
  Filled 2019-01-08: qty 2

## 2019-01-08 MED ORDER — HEPARIN SODIUM (PORCINE) 5000 UNIT/ML IJ SOLN
INTRAMUSCULAR | Status: AC
  Filled 2019-01-08: qty 1

## 2019-01-08 MED ORDER — HYDROCODONE-ACETAMINOPHEN 5-325 MG PO TABS: 1.0000 | ORAL_TABLET | Freq: Four times a day (QID) | ORAL | 0 refills | Status: DC | PRN

## 2019-01-08 MED ORDER — PAPAVERINE HCL 30 MG/ML IJ SOLN
INTRAMUSCULAR | Status: AC
  Filled 2019-01-08: qty 2

## 2019-01-08 MED ORDER — SODIUM CHLORIDE 0.9 % IV SOLN
INTRAVENOUS | Status: DC
  Administered 2019-01-08: 07:00:00 via INTRAVENOUS

## 2019-01-08 MED ORDER — LIDOCAINE HCL (PF) 1 % IJ SOLN
INTRAMUSCULAR | Status: AC
  Filled 2019-01-08: qty 5

## 2019-01-08 MED ORDER — ONDANSETRON HCL 4 MG/2ML IJ SOLN: 4.0000 mg | Freq: Four times a day (QID) | INTRAMUSCULAR | Status: DC | PRN

## 2019-01-08 MED ORDER — PROPOFOL 10 MG/ML IV BOLUS
INTRAVENOUS | Status: AC
  Filled 2019-01-08: qty 20

## 2019-01-08 MED ORDER — FENTANYL CITRATE (PF) 100 MCG/2ML IJ SOLN: 25.0000 ug | INTRAMUSCULAR | Status: DC | PRN

## 2019-01-08 MED ORDER — CEFAZOLIN SODIUM-DEXTROSE 1-4 GM/50ML-% IV SOLN
1.0000 g | INTRAVENOUS | Status: AC
  Administered 2019-01-08: 1 g via INTRAVENOUS

## 2019-01-08 MED ORDER — PROPOFOL 500 MG/50ML IV EMUL
INTRAVENOUS | Status: DC | PRN
  Administered 2019-01-08: 150 ug/kg/min via INTRAVENOUS

## 2019-01-08 MED ORDER — PAPAVERINE HCL 30 MG/ML IJ SOLN
INTRAMUSCULAR | Status: DC | PRN
  Administered 2019-01-08: 30 mg via INTRAVENOUS

## 2019-01-08 MED ORDER — GLYCOPYRROLATE 0.2 MG/ML IJ SOLN
INTRAMUSCULAR | Status: DC | PRN
  Administered 2019-01-08: 0.2 mg via INTRAVENOUS

## 2019-01-08 MED ORDER — FAMOTIDINE 20 MG PO TABS
20.0000 mg | ORAL_TABLET | Freq: Once | ORAL | Status: AC
  Administered 2019-01-08: 07:00:00 20 mg via ORAL

## 2019-01-08 MED ORDER — ONDANSETRON HCL 4 MG/2ML IJ SOLN
INTRAMUSCULAR | Status: AC
  Filled 2019-01-08: qty 2

## 2019-01-08 MED ORDER — LIDOCAINE HCL (CARDIAC) PF 100 MG/5ML IV SOSY
PREFILLED_SYRINGE | INTRAVENOUS | Status: DC | PRN
  Administered 2019-01-08: 60 mg via INTRATRACHEAL

## 2019-01-08 MED ORDER — FENTANYL CITRATE (PF) 100 MCG/2ML IJ SOLN
50.0000 ug | Freq: Once | INTRAMUSCULAR | Status: AC
  Administered 2019-01-08: 08:00:00 50 ug via INTRAVENOUS

## 2019-01-08 MED ORDER — CEFAZOLIN SODIUM-DEXTROSE 2-4 GM/100ML-% IV SOLN
INTRAVENOUS | Status: AC
  Filled 2019-01-08: qty 100

## 2019-01-08 MED ORDER — FENTANYL CITRATE (PF) 100 MCG/2ML IJ SOLN
INTRAMUSCULAR | Status: AC
  Filled 2019-01-08: qty 2

## 2019-01-08 MED ORDER — ROPIVACAINE HCL 5 MG/ML IJ SOLN
INTRAMUSCULAR | Status: AC
  Filled 2019-01-08: qty 30

## 2019-01-08 MED ORDER — HYDROMORPHONE HCL 1 MG/ML IJ SOLN: 1.0000 mg | Freq: Once | INTRAMUSCULAR | Status: DC | PRN

## 2019-01-08 SURGICAL SUPPLY — 62 items
APPLIER CLIP 11 MED OPEN (CLIP)
APPLIER CLIP 9.375 SM OPEN (CLIP)
BAG COUNTER SPONGE EZ (MISCELLANEOUS) ×2 IMPLANT
BAG DECANTER FOR FLEXI CONT (MISCELLANEOUS) ×3 IMPLANT
BLADE SURG SZ11 CARB STEEL (BLADE) ×3 IMPLANT
BOOT SUTURE AID YELLOW STND (SUTURE) ×3 IMPLANT
BRUSH SCRUB EZ  4% CHG (MISCELLANEOUS) ×2
BRUSH SCRUB EZ 4% CHG (MISCELLANEOUS) ×1 IMPLANT
CANISTER SUCT 1200ML W/VALVE (MISCELLANEOUS) ×3 IMPLANT
CHLORAPREP W/TINT 26 (MISCELLANEOUS) ×3 IMPLANT
CLIP APPLIE 11 MED OPEN (CLIP) IMPLANT
CLIP APPLIE 9.375 SM OPEN (CLIP) IMPLANT
COUNTER SPONGE BAG EZ (MISCELLANEOUS) ×1
COVER WAND RF STERILE (DRAPES) ×3 IMPLANT
DERMABOND ADVANCED (GAUZE/BANDAGES/DRESSINGS) ×2
DERMABOND ADVANCED .7 DNX12 (GAUZE/BANDAGES/DRESSINGS) ×1 IMPLANT
DRESSING SURGICEL FIBRLLR 1X2 (HEMOSTASIS) ×1 IMPLANT
DRSG SURGICEL FIBRILLAR 1X2 (HEMOSTASIS) ×3
ELECT CAUTERY BLADE 6.4 (BLADE) ×3 IMPLANT
ELECT REM PT RETURN 9FT ADLT (ELECTROSURGICAL) ×3
ELECTRODE REM PT RTRN 9FT ADLT (ELECTROSURGICAL) ×1 IMPLANT
GLOVE BIO SURGEON STRL SZ7 (GLOVE) ×3 IMPLANT
GLOVE INDICATOR 7.5 STRL GRN (GLOVE) ×3 IMPLANT
GLOVE SURG SYN 8.0 (GLOVE) ×3 IMPLANT
GLOVE SURG SYN 8.0 PF PI (GLOVE) ×1 IMPLANT
GOWN STRL REUS W/ TWL LRG LVL3 (GOWN DISPOSABLE) ×2 IMPLANT
GOWN STRL REUS W/ TWL XL LVL3 (GOWN DISPOSABLE) ×1 IMPLANT
GOWN STRL REUS W/TWL LRG LVL3 (GOWN DISPOSABLE) ×4
GOWN STRL REUS W/TWL XL LVL3 (GOWN DISPOSABLE) ×2
GRAFT PROPATEN STD WALL 4 7X45 (Vascular Products) ×3 IMPLANT
IV NS 500ML (IV SOLUTION) ×2
IV NS 500ML BAXH (IV SOLUTION) ×1 IMPLANT
KIT TURNOVER KIT A (KITS) ×3 IMPLANT
LABEL OR SOLS (LABEL) ×3 IMPLANT
LOOP RED MAXI  1X406MM (MISCELLANEOUS) ×2
LOOP VESSEL MAXI 1X406 RED (MISCELLANEOUS) ×1 IMPLANT
LOOP VESSEL MINI 0.8X406 BLUE (MISCELLANEOUS) ×2 IMPLANT
LOOPS BLUE MINI 0.8X406MM (MISCELLANEOUS) ×4
NDL FILTER BLUNT 18X1 1/2 (NEEDLE) ×1 IMPLANT
NEEDLE FILTER BLUNT 18X 1/2SAF (NEEDLE) ×2
NEEDLE FILTER BLUNT 18X1 1/2 (NEEDLE) ×1 IMPLANT
NS IRRIG 500ML POUR BTL (IV SOLUTION) ×3 IMPLANT
PACK EXTREMITY ARMC (MISCELLANEOUS) ×3 IMPLANT
PAD PREP 24X41 OB/GYN DISP (PERSONAL CARE ITEMS) ×3 IMPLANT
SLING ARM M TX990204 (SOFTGOODS) ×2 IMPLANT
STOCKINETTE 48X4 2 PLY STRL (GAUZE/BANDAGES/DRESSINGS) ×1 IMPLANT
STOCKINETTE STRL 4IN 9604848 (GAUZE/BANDAGES/DRESSINGS) ×3 IMPLANT
SUT GTX CV-6 30 (SUTURE) ×6 IMPLANT
SUT MNCRL+ 5-0 UNDYED PC-3 (SUTURE) ×1 IMPLANT
SUT MONOCRYL 5-0 (SUTURE) ×2
SUT PROLENE 6 0 BV (SUTURE) ×6 IMPLANT
SUT SILK 2 0 (SUTURE) ×2
SUT SILK 2 0 SH (SUTURE) ×5 IMPLANT
SUT SILK 2-0 18XBRD TIE 12 (SUTURE) ×1 IMPLANT
SUT SILK 3 0 (SUTURE) ×2
SUT SILK 3-0 18XBRD TIE 12 (SUTURE) ×1 IMPLANT
SUT SILK 4 0 (SUTURE) ×2
SUT SILK 4-0 18XBRD TIE 12 (SUTURE) ×1 IMPLANT
SUT VIC AB 3-0 SH 27 (SUTURE) ×4
SUT VIC AB 3-0 SH 27X BRD (SUTURE) ×2 IMPLANT
SYR 20ML LL LF (SYRINGE) ×3 IMPLANT
SYR 3ML LL SCALE MARK (SYRINGE) ×3 IMPLANT

## 2019-01-08 NOTE — Anesthesia Preprocedure Evaluation (Signed)
Anesthesia Evaluation  Patient identified by MRN, date of birth, ID band Patient awake    Reviewed: Allergy & Precautions, NPO status , Patient's Chart, lab work & pertinent test results  History of Anesthesia Complications Negative for: history of anesthetic complications  Airway Mallampati: II  TM Distance: >3 FB Neck ROM: Full    Dental  (+) Partial Lower, Partial Upper   Pulmonary neg sleep apnea, neg COPD, former smoker,    breath sounds clear to auscultation- rhonchi (-) wheezing      Cardiovascular hypertension, Pt. on medications (-) CAD, (-) Past MI, (-) Cardiac Stents and (-) CABG  Rhythm:Regular Rate:Normal - Systolic murmurs and - Diastolic murmurs    Neuro/Psych neg Seizures negative neurological ROS  negative psych ROS   GI/Hepatic negative GI ROS, Neg liver ROS,   Endo/Other  diabetes, Insulin Dependent  Renal/GU CRFRenal disease     Musculoskeletal  (+) Arthritis ,   Abdominal (+) - obese,   Peds  Hematology  (+) anemia ,   Anesthesia Other Findings Past Medical History: No date: Anemia 2008: Cancer ([place])     Comment:  colon No date: Chronic kidney disease No date: Diabetes mellitus without complication (HCC) No date: Heart murmur     Comment:  asymptomatic No date: Hypertension   Reproductive/Obstetrics                             Anesthesia Physical Anesthesia Plan  ASA: III  Anesthesia Plan: General   Post-op Pain Management:  Regional for Post-op pain   Induction: Intravenous  PONV Risk Score and Plan: 1 and Propofol infusion  Airway Management Planned: Natural Airway  Additional Equipment:   Intra-op Plan:   Post-operative Plan:   Informed Consent: I have reviewed the patients History and Physical, chart, labs and discussed the procedure including the risks, benefits and alternatives for the proposed anesthesia with the patient or authorized  representative who has indicated his/her understanding and acceptance.     Dental advisory given  Plan Discussed with: CRNA and Anesthesiologist  Anesthesia Plan Comments:         Anesthesia Quick Evaluation

## 2019-01-08 NOTE — Discharge Instructions (Signed)
  AMBULATORY SURGERY  DISCHARGE INSTRUCTIONS   1) The drugs that you were given will stay in your system until tomorrow so for the next 24 hours you should not:  A) Drive an automobile B) Make any legal decisions C) Drink any alcoholic beverage   2) You may resume regular meals tomorrow.  Today it is better to start with liquids and gradually work up to solid foods.  You may eat anything you prefer, but it is better to start with liquids, then soup and crackers, and gradually work up to solid foods.   3) Please notify your doctor immediately if you have any unusual bleeding, trouble breathing, redness and pain at the surgery site, drainage, fever, or pain not relieved by medication.    4) Additional Instructions:        Please contact your physician with any problems or Same Day Surgery at [phone], Monday through Friday 6 am to 4 pm, or [place] at [place] number at [phone].  Peripheral Nerve Block, Upper Extremity (PNBUE) Discharge Instructions   1.  For your surgery you have received a Peripheral Nerve Block. 2. Nerve Blocks affect many types of nerves, including nerves that control movement, pain and normal sensation.  You may experience feelings such as numbness, tingling, heaviness, weakness or the inability to move your arm or the feeling or sensation that your arm has "fallen asleep". 3. A nerve block can last for 2 - 36 hours or more depending on the medication used.  Usually the weakness wears off first.  The tingling and heaviness usually wear off next.  Finally you may start to notice pain.  Keep in mind that this may occur in any order.  once a nerve block starts to wear off it is usually completely gone within 60 minutes. 4. If needed, your surgeon will give you a prescription for pain medication.  It will take about 60 minutes for the oral pain medication to become fully effective.  So, it is recommended that you start taking this medication  before the nerve block first begins to wear off, or when you first begin to feel discomfort. 5. Keep in mind that nerve blocks often wear off in the middle of the night.  If you are going to bed and the block has not started to wear off or you have not started to have any discomfort, consider setting an alarm for 2 to 3 hours, so you can assess your block.  If you notice the block is wearing off or you are starting to have discomfort, you can take your pain medication. 6. Take your pain medication only as prescribed.  Pain medication can cause sedation and decrease your breathing if you take more than you need for the level of pain that you have. 7. Nausea is a common side effect of many pain medications.  You may want to eat something before taking your pain medicine to prevent nausea. 8. After a peripheral nerve block, you cannot feel pain, pressure or extremes in temperature in the effected arm.  Because your arm is numb it is at an increased risk for injury.  To decrease the possibility of injury, please practice the following:  a. While you are awake change the position of your arm frequently to prevent too much pressure on any one area for prolonged periods of time. b.  If you have a cast or tight dressing, check the color or your fingers every couple of hours.  Call your   surgeon with the appearance of any discoloration (white or blue). c. If you are given a sling to wear before you go home, please wear it  at all times until the block has completely worn off.  Do not get up at night without your sling. d. If you experience any problems or concerns, please contact your surgeon's office. e. If you experience severe or prolonged shortness of breath go to the nearest emergency department.  

## 2019-01-08 NOTE — Anesthesia Post-op Follow-up Note (Signed)
Anesthesia QCDR form completed.        

## 2019-01-08 NOTE — Anesthesia Postprocedure Evaluation (Signed)
Anesthesia Post Note  Patient: Dylan Sanders  Procedure(s) Performed: INSERTION OF ARTERIOVENOUS (AV) GORE-TEX GRAFT ARM ( BRACHIAL AXILLARY ) (Left )  Patient location during evaluation: PACU Anesthesia Type: General Level of consciousness: awake and alert and oriented Pain management: pain level controlled Vital Signs Assessment: post-procedure vital signs reviewed and stable Respiratory status: spontaneous breathing, nonlabored ventilation and respiratory function stable Cardiovascular status: blood pressure returned to baseline and stable Postop Assessment: no signs of nausea or vomiting Anesthetic complications: no     Last Vitals:  Vitals:   01/08/19 1318 01/08/19 1400  BP: (!) 178/63 (!) 142/59  Pulse: (!) 56 (!) 59  Resp: 16 18  Temp: (!) 36 C (!) 36.1 C  SpO2: 100% 100%    Last Pain:  Vitals:   01/08/19 1400  TempSrc: Temporal  PainSc: 0-No pain                 Lebron Nauert

## 2019-01-08 NOTE — Anesthesia Procedure Notes (Signed)
Anesthesia Regional Block: Supraclavicular block   Pre-Anesthetic Checklist: ,, timeout performed, Correct Patient, Correct Site, Correct Laterality, Correct Procedure, Correct Position, site marked, Risks and benefits discussed,  Surgical consent,  Pre-op evaluation,  At surgeon's request and post-op pain management  Laterality: Left  Prep: chloraprep       Needles:  Injection technique: Single-shot  Needle Type: Stimiplex     Needle Length: 10cm  Needle Gauge: 21     Additional Needles:   Procedures:,,,, ultrasound used (permanent image in chart),,,,  Narrative:  Start time: 01/08/2019 8:24 AM End time: 01/08/2019 8:31 AM Injection made incrementally with aspirations every 5 mL.  Performed by: Personally  Anesthesiologist: Emmie Niemann, MD  Additional Notes: Functioning IV was confirmed and monitors were applied.  A Stimuplex needle was used. Sterile prep and drape,hand hygiene and sterile gloves were used.  Negative aspiration and negative test dose prior to incremental administration of local anesthetic. The patient tolerated the procedure well.  Supplemental intercostal brachial block also performed

## 2019-01-08 NOTE — H&P (Signed)
[facility] History & Physical Update  The patient was interviewed and re-examined.  The patient's previous History and Physical has been reviewed and is unchanged.  There is no change in the plan of care. We plan to proceed with the scheduled procedure.  Hortencia Pilar, MD  01/08/2019, 9:10 AM

## 2019-01-08 NOTE — Transfer of Care (Signed)
Immediate Anesthesia Transfer of Care Note  Patient: Dylan Sanders  Procedure(s) Performed: INSERTION OF ARTERIOVENOUS (AV) GORE-TEX GRAFT ARM ( BRACHIAL AXILLARY ) (Left )  Patient Location: PACU  Anesthesia Type:General  Level of Consciousness: awake and alert   Airway & Oxygen Therapy: Patient Spontanous Breathing  Post-op Assessment: Report given to RN and Post -op Vital signs reviewed and stable  Post vital signs: Reviewed and stable  Last Vitals:  Vitals Value Taken Time  BP 145/72 01/08/19 1205  Temp    Pulse 54 01/08/19 1205  Resp 17 01/08/19 1205  SpO2 100 % 01/08/19 1205  Vitals shown include unvalidated device data.  Last Pain:  Vitals:   01/08/19 0611  TempSrc: Temporal         Complications: No apparent anesthesia complications

## 2019-01-08 NOTE — Op Note (Signed)
OPERATIVE NOTE   PROCEDURE: left brachial axillary arteriovenous graft placement  PRE-OPERATIVE DIAGNOSIS: End Stage Renal Disease  POST-OPERATIVE DIAGNOSIS: End Stage Renal Disease  SURGEON: Hortencia Pilar  ASSISTANT(S): Dylan Sanders  ANESTHESIA: regional  ESTIMATED BLOOD LOSS: <50 cc  FINDING(S): 4 mm brachial artery 4 to 5 mm axillary vein  SPECIMEN(S):  none  INDICATIONS:   Dylan Sanders is a 85 y.o. male who presents with end stage renal disease.  The patient is scheduled for left brachial axillary AV graft placement.  The patient is aware the risks include but are not limited to: bleeding, infection, steal syndrome, nerve damage, ischemic monomelic neuropathy, failure to mature, and need for additional procedures.  The patient is aware of the risks of the procedure and elects to proceed forward.  DESCRIPTION: After full informed written consent was obtained from the patient, the patient was brought back to the operating room and placed supine upon the operating table.  Prior to induction, the patient received IV antibiotics.   After obtaining adequate anesthesia, the patient was then prepped and draped in the standard fashion for a left arm access procedure.   A first assistant was required to provide a safe and appropriate environment for executing the surgery.  The assistant was integral in providing retraction, exposure, running suture providing suction and in the closing process.   A linear incision was then created along the medial border of the biceps muscle just proximal to the antecubital crease and the brachial artery which was exposed through. The brachial artery was then looped proximally and distally with Silastic Vesseloops. Side branches were controlled with 4-0 silk ties.  Attention was then turned to the exposure of the axillary vein. Linear incision was then created medial to the proximal portion of the biceps at the level of the anterior  axillary crease. The axillary vein was exposed and again looped proximally and distally with Silastic vessel loops. Associated tributaries were also controlled with Silastic Vesseloops.  The Gore tunneler was then delivered onto the field and a subcutaneous path was made from the arterial incision to the venous incision. A 4-7 tapered PTFE propatent graft by Dylan Sanders was then pulled through the subcutaneous tunnel. The arterial 4 mm portion was then approximated to the brachial artery. Brachial artery was controlled proximally and distally with the Silastic Vesseloops. Arteriotomy was made with an 11 blade scalpel and extended with Potts scissors and a 6-0 Prolene stay suture was placed. End graft to side brachial artery anastomosis was then fashioned with running CV 6 suture. Flushing maneuvers were performed suture line was hemostatic and the graft was then assessed for proper position and ease of future cannulation. Heparinized saline was infused into the vein and the graft was clamped with a vascular clamp. With the graft pressurized it was approximated to the axillary vein in its native bed and then marked with a surgical marker. The vein was then delivered into the surgical field and controlled with the Silastic vessel loops. Venotomy was then made with an 11 blade scalpel and extended with Potts scissors and a 6-0 Prolene suture was used as stay suture. The the graft was then sewn to the vein in an end graft to side vein fashion using running CV 6 suture.  Flushing maneuvers were performed and the artery was allowed to forward and back bleed.  Flow was then established through the AV graft  There was good  thrill in the venous outflow, and there was 1+  palpable radial pulse.  At this point, I irrigated out the surgical wounds.  There was no further active bleeding.  The subcutaneous tissue was reapproximated with a running stitch of 3-0 Vicryl.  The skin was then reapproximated with a running subcuticular  stitch of 4-0 Vicryl.  The skin was then cleaned, dried, and reinforced with Dermabond.    The patient tolerated this procedure well.   COMPLICATIONS: None  CONDITION: [facility]  Office: [phone]   01/08/2019, 11:42 AM

## 2019-01-10 ENCOUNTER — Encounter: Payer: Self-pay | Admitting: Vascular Surgery

## 2019-01-14 ENCOUNTER — Telehealth (INDEPENDENT_AMBULATORY_CARE_PROVIDER_SITE_OTHER): Payer: Self-pay

## 2019-01-14 DIAGNOSIS — N184 Chronic kidney disease, stage 4 (severe): Secondary | ICD-10-CM | POA: Diagnosis not present

## 2019-01-14 DIAGNOSIS — J029 Acute pharyngitis, unspecified: Secondary | ICD-10-CM | POA: Diagnosis not present

## 2019-01-14 NOTE — Telephone Encounter (Signed)
I ask the patient was the blueness color with the fingertips coming from hanging down or elevating the left arm.The patient inform me that the blueness on the fingertips came while resting his arm on the arm rest but at this time he is having some purple with the fingertips while he is elevating and using ice pack.Eulogio Ditch NP advise for the patient to contact the office if the blueness come back or start having pain and to use the ice pack 10 minutes on and 20 minutes off.Patient has been inform with medical advice.

## 2019-01-14 NOTE — Telephone Encounter (Signed)
Please emphasize to the patient that he should not put an Ace bandage on the arm.  This is due to the fact that compression on that arm can actually damage his graft.  In order to help the swelling the patient should elevate his hand.  Some swelling, soreness and numbness are all normal especially this close to surgery.  As far as his fingers ask if the blueness started in his fingertips or was in his hand itself.  Also was this wound is constant or does not come and go or was just a one-time thing?  Lastly, ask if the bluish tint comes when he elevates his arm.

## 2019-01-20 ENCOUNTER — Telehealth (INDEPENDENT_AMBULATORY_CARE_PROVIDER_SITE_OTHER): Payer: Self-pay | Admitting: Vascular Surgery

## 2019-01-20 NOTE — Telephone Encounter (Signed)
Patient calling stating he had surgery on Left arm 01/08/19 by Schnier and that his arm and hand are still swollen. He says his hand is numb and sometimes his finger tips look blue, but not at the moment. Some redness around incision site. No drainage and no pain. Patient asking if numbness and swelling is normal or should he be seen. Please advise. AS, CMA

## 2019-01-21 NOTE — Telephone Encounter (Signed)
The swelling is normal and numbness can happen with some individuals.  We can bring him for follow up sooner than 09/22.  Let's get him on next week with an HDA and also assess for steal

## 2019-01-21 NOTE — Telephone Encounter (Signed)
Patient is aware of the below.   Can you please contact patient to schedule apt as advised below per [facility]. AS, CMA

## 2019-01-22 ENCOUNTER — Other Ambulatory Visit (INDEPENDENT_AMBULATORY_CARE_PROVIDER_SITE_OTHER): Payer: Self-pay | Admitting: Nurse Practitioner

## 2019-01-22 DIAGNOSIS — T829XXS Unspecified complication of cardiac and vascular prosthetic device, implant and graft, sequela: Secondary | ICD-10-CM

## 2019-01-22 DIAGNOSIS — N186 End stage renal disease: Secondary | ICD-10-CM

## 2019-01-25 DIAGNOSIS — E119 Type 2 diabetes mellitus without complications: Secondary | ICD-10-CM | POA: Diagnosis not present

## 2019-01-27 ENCOUNTER — Ambulatory Visit (INDEPENDENT_AMBULATORY_CARE_PROVIDER_SITE_OTHER): Payer: PPO

## 2019-01-27 ENCOUNTER — Encounter (INDEPENDENT_AMBULATORY_CARE_PROVIDER_SITE_OTHER): Payer: Self-pay | Admitting: Nurse Practitioner

## 2019-01-27 ENCOUNTER — Other Ambulatory Visit: Payer: Self-pay

## 2019-01-27 ENCOUNTER — Ambulatory Visit (INDEPENDENT_AMBULATORY_CARE_PROVIDER_SITE_OTHER): Payer: PPO | Admitting: Nurse Practitioner

## 2019-01-27 VITALS — BP 135/55 | HR 60 | Resp 10 | Ht 68.0 in | Wt 150.0 lb

## 2019-01-27 DIAGNOSIS — T829XXS Unspecified complication of cardiac and vascular prosthetic device, implant and graft, sequela: Secondary | ICD-10-CM | POA: Diagnosis not present

## 2019-01-27 DIAGNOSIS — N186 End stage renal disease: Secondary | ICD-10-CM

## 2019-01-27 DIAGNOSIS — N184 Chronic kidney disease, stage 4 (severe): Secondary | ICD-10-CM

## 2019-01-27 DIAGNOSIS — I701 Atherosclerosis of renal artery: Secondary | ICD-10-CM | POA: Diagnosis not present

## 2019-01-27 DIAGNOSIS — M542 Cervicalgia: Secondary | ICD-10-CM | POA: Insufficient documentation

## 2019-01-27 DIAGNOSIS — I10 Essential (primary) hypertension: Secondary | ICD-10-CM

## 2019-01-27 MED ORDER — CLOPIDOGREL BISULFATE 75 MG PO TABS: 75.0000 mg | ORAL_TABLET | Freq: Every day | ORAL | 4 refills | Status: DC

## 2019-02-01 ENCOUNTER — Encounter (INDEPENDENT_AMBULATORY_CARE_PROVIDER_SITE_OTHER): Payer: PPO

## 2019-02-01 ENCOUNTER — Ambulatory Visit (INDEPENDENT_AMBULATORY_CARE_PROVIDER_SITE_OTHER): Payer: PPO | Admitting: Vascular Surgery

## 2019-02-02 ENCOUNTER — Ambulatory Visit (INDEPENDENT_AMBULATORY_CARE_PROVIDER_SITE_OTHER): Payer: PPO | Admitting: Nurse Practitioner

## 2019-02-02 ENCOUNTER — Encounter (INDEPENDENT_AMBULATORY_CARE_PROVIDER_SITE_OTHER): Payer: Self-pay

## 2019-02-02 ENCOUNTER — Encounter (INDEPENDENT_AMBULATORY_CARE_PROVIDER_SITE_OTHER): Payer: PPO

## 2019-02-02 ENCOUNTER — Encounter (INDEPENDENT_AMBULATORY_CARE_PROVIDER_SITE_OTHER): Payer: Self-pay | Admitting: Nurse Practitioner

## 2019-02-02 NOTE — Progress Notes (Signed)
SUBJECTIVE:  Patient ID: Dylan Sanders, male    DOB: 04-01-1932, 85 y.o.   MRN: [id number] Chief Complaint  Patient presents with  . Follow-up    HPI  Dylan Sanders is a 85 y.o. male that presents today for evaluation of his recently placed left brachial axillary graft.  Graft was placed on 01/05/2019.  The patient has had some swelling in the left upper extremity as well as discoloration of his fingertips.  The patient is unable to pinpoint a timeframe or precipitating factor for when his fingers will turn blue.  However he states that it happened initially after the surgery and has not really happened since that time.  The patient has been elevating his arm as well as applying ice packs for swelling.  He denies any fever, chills, nausea, vomiting or diarrhea.  Currently, the patient is not yet on hemodialysis.  However it is possible he may begin before the end of the year.  His incision is well-healed.   The patient also had a right renal artery stent placed in 07/03/2018.  His blood pressure is maintained adequately.  Today the right renal artery shows a 1 to 59% stenosis.  The previously placed stent is patent.  The left kidney is not visualized.  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  The brachioaxillary graft has a flow volume of 1554.  No evidence of steal.  There is a increase in velocities from the mid graft to the proximal graft however this is mainly where the patient's edema is located.  Past Medical History:  Diagnosis Date  . Anemia   . Cancer [facility]) 2008   colon  . Chronic kidney disease   . Diabetes mellitus without complication ([place])   . Heart murmur    asymptomatic  . Hypertension     Past Surgical History:  Procedure Laterality Date  . AV FISTULA PLACEMENT Left 01/08/2019   Procedure: INSERTION OF ARTERIOVENOUS (AV) GORE-TEX GRAFT ARM ( BRACHIAL AXILLARY );  Surgeon: Katha Cabal, MD;  Location: [facility] ORS;   Service: Vascular;  Laterality: Left;  . COLON SURGERY    . EYE SURGERY     bilateral  . RENAL ANGIOGRAPHY Bilateral 11/19/2016   Procedure: Renal Angiography;  Surgeon: Katha Cabal, MD;  Location: [place] [facility];  Service: Cardiovascular;  Laterality: Bilateral;  . RENAL ANGIOGRAPHY Right 07/03/2018   Procedure: RENAL ANGIOGRAPHY;  Surgeon: Katha Cabal, MD;  Location: [place] [facility];  Service: Cardiovascular;  Laterality: Right;  . REVERSE SHOULDER ARTHROPLASTY Left 09/19/2016   Procedure: REVERSE SHOULDER ARTHROPLASTY;  Surgeon: Corky Mull, MD;  Location: [facility] ORS;  Service: Orthopedics;  Laterality: Left;  . ROTATOR CUFF REPAIR Right     Social History   Socioeconomic History  . Marital status: Widowed    Spouse name: Not on file  . Number of children: Not on file  . Years of education: Not on file  . Highest education level: Not on file  Occupational History  . Not on file  Social Needs  . Financial resource strain: Not on file  . Food insecurity    Worry: Not on file    Inability: Not on file  . Transportation needs    Medical: Not on file    Non-medical: Not on file  Tobacco Use  . Smoking status: Former Smoker    Years: 25.00    Types: Pipe, Cigarettes, Cigars  Quit date: 09/05/1976    Years since quitting: 42.4  . Smokeless tobacco: Never Used  Substance and Sexual Activity  . Alcohol use: Yes    Comment: ocassionally  . Drug use: No  . Sexual activity: Never  Lifestyle  . Physical activity    Days per week: Not on file    Minutes per session: Not on file  . Stress: Not on file  Relationships  . Social Herbalist on phone: Not on file    Gets together: Not on file    Attends religious service: Not on file    Active member of club or organization: Not on file    Attends meetings of clubs or organizations: Not on file    Relationship status: Not on file  . Intimate partner violence    Fear of current or ex partner:  Not on file    Emotionally abused: Not on file    Physically abused: Not on file    Forced sexual activity: Not on file  Other Topics Concern  . Not on file  Social History Narrative  . Not on file    Family History  Problem Relation Age of Onset  . Cancer Father     No Known Allergies   Review of Systems   Review of Systems: Negative Unless Checked Constitutional: _0 Weight loss  _1 Fever  _2 Chills Cardiac: _3 Chest pain   _4  Atrial Fibrillation  _5 Palpitations   _6 Shortness of breath when laying flat   _7 Shortness of breath with exertion. _8 Shortness of breath at rest Vascular:  _9 Pain in legs with walking   _10 Pain in legs with standing _11 Pain in legs when laying flat   _12 Claudication    _13 Pain in feet when laying flat    _14 History of DVT   _15 Phlebitis   _16 Swelling in legs   _17 Varicose veins   _18 Non-healing ulcers Pulmonary:   _19 Uses home oxygen   _20 Productive cough   _21 Hemoptysis   _22 Wheeze  _23 COPD   _24 Asthma Neurologic:  _25 Dizziness   _26 Seizures  _27 Blackouts _28 History of stroke   _29 History of TIA  _30 Aphasia   _31 Temporary Blindness   _32 Weakness or numbness in arm   _33 Weakness or numbness in leg Musculoskeletal:   _34 Joint swelling   _35 Joint pain   _36 Low back pain  _37  History of Knee Replacement _38 Arthritis _39 back Surgeries  _40  Spinal Stenosis    Hematologic:  _41 Easy bruising  _42 Easy bleeding   _43 Hypercoagulable state   _44 Anemic Gastrointestinal:  _45 Diarrhea   _46 Vomiting  _47 Gastroesophageal reflux/heartburn   _48 Difficulty swallowing. _49 Abdominal pain Genitourinary:  _50 Chronic kidney disease   _51 Difficult urination  _52 Anuric   _53 Blood in urine _54 Frequent urination  _55 Burning with urination   _56 Hematuria Skin:  _57 Rashes   _58 Ulcers _59 Wounds Psychological:  _60 History of anxiety   _61  History of major depression  _62  Memory Difficulties      OBJECTIVE:   Physical Exam  BP (!) 135/55 (BP Location: Right Arm, Patient Position: Sitting, Cuff Size: Normal)   Pulse 60   Resp 10   Ht 5'  8" (1.727 m)   Wt 150 lb (68 kg)   BMI 22.81 kg/m   Gen: WD/WN, NAD Head: [place]/AT, No temporalis wasting.  Ear/Nose/Throat: Hearing grossly intact, nares w/o erythema or drainage Eyes: PER, EOMI, sclera nonicteric.  Neck: Supple, no masses.  No JVD.  Pulmonary:  Good air movement, no use of accessory muscles.  Cardiac: RRR Vascular:  Good thrill and bruit, no fingertip discoloration, upper arm and area around fistula  visibly swollen Vessel Right Left  Radial Palpable Palpable   Gastrointestinal: soft, non-distended. No guarding/no peritoneal signs.  Musculoskeletal: M/S 5/5 throughout.  No deformity or atrophy.  Neurologic: Pain and light touch intact in extremities.  Symmetrical.  Speech is fluent. Motor exam as listed above. Psychiatric: Judgment intact, Mood & affect appropriate for pt's clinical situation. Dermatologic: No Venous rashes. No Ulcers Noted.  No changes consistent with cellulitis. Lymph : No Cervical lymphadenopathy, no lichenification or skin changes of chronic lymphedema.       ASSESSMENT AND PLAN:  1. Renal artery stenosis (HCC) Currently the patient's blood pressure is well maintained and his current diet is patent.  We will send in a refill for Plavix per patient's request. - clopidogrel (PLAVIX) 75 MG tablet; Take 1 tablet (75 mg total) by mouth daily.  Dispense: 90 tablet; Refill: 4  2. Benign essential hypertension Continue antihypertensive medications as already ordered, these medications have been reviewed and there are no changes at this time.   3. CKD (chronic kidney disease) stage 4, GFR 15-29 ml/min (HCC) We will have the patient return to the office in 3 weeks in order to evaluate the swelling of his upper extremity.  The patient is also asked to know if his fingertips do turn blue what activity was being done prior to determine what may be the possible cause of fingertip discoloration.   Current Outpatient Medications on File Prior to Visit   Medication Sig Dispense Refill  . acetaminophen (TYLENOL) 500 MG tablet Take 1,000 mg by mouth every 6 (six) hours as needed for mild pain.     Marland Kitchen allopurinol (ZYLOPRIM) 100 MG tablet Take 100 mg by mouth at bedtime.     Marland Kitchen amLODipine (NORVASC) 10 MG tablet Take 10 mg by mouth every morning.     Marland Kitchen aspirin EC 81 MG tablet Take 81 mg by mouth at bedtime.     . cloNIDine (CATAPRES) 0.1 MG tablet Take 0.1 mg by mouth every evening.     Marland Kitchen LANTUS SOLOSTAR 100 UNIT/ML Solostar Pen Inject 10 Units into the skin every morning.     . simvastatin (ZOCOR) 20 MG tablet Take 20 mg by mouth at bedtime.    Marland Kitchen telmisartan (MICARDIS) 80 MG tablet Take 80 mg by mouth every morning.     . Blood Glucose Monitoring Suppl (ONE TOUCH ULTRA 2) w/Device KIT USE TO CHECK SUGARS TWICE DAILY     No current facility-administered medications on file prior to visit.     There are no Patient Instructions on file for this visit. No follow-ups on file.   Kris Hartmann, NP  This note was completed with Sales executive.  Any errors are purely unintentional.

## 2019-02-11 DIAGNOSIS — E1122 Type 2 diabetes mellitus with diabetic chronic kidney disease: Secondary | ICD-10-CM | POA: Diagnosis not present

## 2019-02-11 DIAGNOSIS — E785 Hyperlipidemia, unspecified: Secondary | ICD-10-CM | POA: Diagnosis not present

## 2019-02-11 DIAGNOSIS — N184 Chronic kidney disease, stage 4 (severe): Secondary | ICD-10-CM | POA: Diagnosis not present

## 2019-02-11 DIAGNOSIS — I129 Hypertensive chronic kidney disease with stage 1 through stage 4 chronic kidney disease, or unspecified chronic kidney disease: Secondary | ICD-10-CM | POA: Diagnosis not present

## 2019-02-17 NOTE — Progress Notes (Signed)
Patient ID: Dylan Sanders, male   DOB: 08-Nov-1931, 85 y.o.   MRN: [id number]  No chief complaint on file.   HPI Dylan Sanders is a 85 y.o. male.    Patient returns s/p creation of a left brachial axillary arteriovenous graft placement on 01/08/2019.  The patient denies hand pain or other symptoms consistent with steal phenomena.  No significant arm swelling.  The patient denies redness or swelling at the access site. The patient denies fever or chills at home or while on dialysis.  The patient denies amaurosis fugax or recent TIA symptoms. There are no recent neurological changes noted. The patient denies claudication symptoms or rest pain symptoms. The patient denies history of DVT, PE or superficial thrombophlebitis. The patient denies recent episodes of angina or shortness of breath.        Past Medical History:  Diagnosis Date  . Anemia   . Cancer [facility]) 2008   colon  . Chronic kidney disease   . Diabetes mellitus without complication ([place])   . Heart murmur    asymptomatic  . Hypertension     Past Surgical History:  Procedure Laterality Date  . AV FISTULA PLACEMENT Left 01/08/2019   Procedure: INSERTION OF ARTERIOVENOUS (AV) GORE-TEX GRAFT ARM ( BRACHIAL AXILLARY );  Surgeon: Katha Cabal, MD;  Location: [facility];  Service: Vascular;  Laterality: Left;  . COLON SURGERY    . EYE SURGERY     bilateral  . RENAL ANGIOGRAPHY Bilateral 11/19/2016   Procedure: Renal Angiography;  Surgeon: Katha Cabal, MD;  Location: [place] [facility];  Service: Cardiovascular;  Laterality: Bilateral;  . RENAL ANGIOGRAPHY Right 07/03/2018   Procedure: RENAL ANGIOGRAPHY;  Surgeon: Katha Cabal, MD;  Location: [place] [facility];  Service: Cardiovascular;  Laterality: Right;  . REVERSE SHOULDER ARTHROPLASTY Left 09/19/2016   Procedure: REVERSE SHOULDER ARTHROPLASTY;  Surgeon: Corky Mull, MD;  Location: [facility];  Service: Orthopedics;  Laterality: Left;  . ROTATOR  CUFF REPAIR Right       No Known Allergies  Current Outpatient Medications  Medication Sig Dispense Refill  . acetaminophen (TYLENOL) 500 MG tablet Take 1,000 mg by mouth every 6 (six) hours as needed for mild pain.     Marland Kitchen allopurinol (ZYLOPRIM) 100 MG tablet Take 100 mg by mouth at bedtime.     Marland Kitchen amLODipine (NORVASC) 10 MG tablet Take 10 mg by mouth every morning.     Marland Kitchen aspirin EC 81 MG tablet Take 81 mg by mouth at bedtime.     . Blood Glucose Monitoring Suppl (ONE TOUCH ULTRA 2) w/Device KIT USE TO CHECK SUGARS TWICE DAILY    . cloNIDine (CATAPRES) 0.1 MG tablet Take 0.1 mg by mouth every evening.     . clopidogrel (PLAVIX) 75 MG tablet Take 1 tablet (75 mg total) by mouth daily. 90 tablet 4  . LANTUS SOLOSTAR 100 UNIT/ML Solostar Pen Inject 10 Units into the skin every morning.     . simvastatin (ZOCOR) 20 MG tablet Take 20 mg by mouth at bedtime.    Marland Kitchen telmisartan (MICARDIS) 80 MG tablet Take 80 mg by mouth every morning.      No current facility-administered medications for this visit.         Physical Exam There were no vitals taken for this visit. Gen:  WD/WN, NAD Skin: incision C/D/I     Assessment/Plan:  No problem-specific Assessment & Plan notes found for this encounter.  Dylan Sanders 02/17/2019, 11:35 AM   This note was created with Dragon medical transcription system.  Any errors from dictation are unintentional.

## 2019-02-18 ENCOUNTER — Ambulatory Visit (INDEPENDENT_AMBULATORY_CARE_PROVIDER_SITE_OTHER): Payer: PPO | Admitting: Vascular Surgery

## 2019-02-18 ENCOUNTER — Encounter (INDEPENDENT_AMBULATORY_CARE_PROVIDER_SITE_OTHER): Payer: Self-pay | Admitting: Vascular Surgery

## 2019-02-18 ENCOUNTER — Other Ambulatory Visit: Payer: Self-pay

## 2019-02-18 ENCOUNTER — Other Ambulatory Visit (INDEPENDENT_AMBULATORY_CARE_PROVIDER_SITE_OTHER): Payer: Self-pay | Admitting: Vascular Surgery

## 2019-02-18 VITALS — BP 128/62 | HR 70 | Resp 10 | Ht 68.0 in | Wt 144.0 lb

## 2019-02-18 DIAGNOSIS — Z992 Dependence on renal dialysis: Secondary | ICD-10-CM

## 2019-02-18 DIAGNOSIS — T829XXA Unspecified complication of cardiac and vascular prosthetic device, implant and graft, initial encounter: Secondary | ICD-10-CM

## 2019-02-18 DIAGNOSIS — Z95828 Presence of other vascular implants and grafts: Secondary | ICD-10-CM

## 2019-02-18 DIAGNOSIS — N185 Chronic kidney disease, stage 5: Secondary | ICD-10-CM

## 2019-02-18 DIAGNOSIS — N2889 Other specified disorders of kidney and ureter: Secondary | ICD-10-CM

## 2019-02-19 ENCOUNTER — Ambulatory Visit (INDEPENDENT_AMBULATORY_CARE_PROVIDER_SITE_OTHER): Payer: PPO

## 2019-02-19 ENCOUNTER — Encounter (INDEPENDENT_AMBULATORY_CARE_PROVIDER_SITE_OTHER): Payer: Self-pay | Admitting: Nurse Practitioner

## 2019-02-19 ENCOUNTER — Ambulatory Visit (INDEPENDENT_AMBULATORY_CARE_PROVIDER_SITE_OTHER): Payer: PPO | Admitting: Nurse Practitioner

## 2019-02-19 VITALS — BP 162/64 | HR 59 | Resp 16 | Wt 144.0 lb

## 2019-02-19 DIAGNOSIS — T829XXA Unspecified complication of cardiac and vascular prosthetic device, implant and graft, initial encounter: Secondary | ICD-10-CM | POA: Diagnosis not present

## 2019-02-19 DIAGNOSIS — N186 End stage renal disease: Secondary | ICD-10-CM

## 2019-02-19 DIAGNOSIS — E118 Type 2 diabetes mellitus with unspecified complications: Secondary | ICD-10-CM

## 2019-02-19 DIAGNOSIS — I10 Essential (primary) hypertension: Secondary | ICD-10-CM

## 2019-02-19 NOTE — Progress Notes (Signed)
SUBJECTIVE:  Patient ID: Dylan Sanders, male    DOB: 1931/10/01, 85 y.o.   MRN: [id number] Chief Complaint  Patient presents with  . Follow-up    ultrasound follow up    HPI  Dylan Sanders is a 85 y.o. male presents today for evaluation of a left brachial axillary graft placed on 01/05/2019.  Previously the patient had swelling in the left upper extremity as well as some discoloration of his fingertips.  Today the swelling is much improved however it is still present.  He states that the numbness and tingling that he felt in his fingertips has greatly decreased.  The patient has neuropathy and has consistent numbness and tingling and he states that it is back to the level it was before his surgery.  He also denies any discoloration of his fingertips since he was last seen by me.  He denies any fever, chills, nausea, vomiting or diarrhea.  The patient is currently still not on dialysis however he will be seeing his nephrologist soon.  He does anticipate that he will begin dialysis prior to the end of the year.  Today noninvasive studies show that the patient has a flow volume of 1281.  He has a patent left brachial axillary AV graft with no internal vessel narrowing or focal extra graft fluid collection.  He does have evidence of a hemodynamically significant velocity increase to the venous anastomosis however this was not noted on the previous exam, but this area has not had a significant change in the anatomic layout  since then.  The patient also has no evidence of steal syndrome.  He also has biphasic antegrade flow in his distal radial artery.  Past Medical History:  Diagnosis Date  . Anemia   . Cancer [facility]) 2008   colon  . Chronic kidney disease   . Diabetes mellitus without complication ([place])   . Heart murmur    asymptomatic  . Hypertension     Past Surgical History:  Procedure Laterality Date  . AV FISTULA PLACEMENT Left 01/08/2019   Procedure: INSERTION OF ARTERIOVENOUS (AV)  GORE-TEX GRAFT ARM ( BRACHIAL AXILLARY );  Surgeon: Katha Cabal, MD;  Location: [facility];  Service: Vascular;  Laterality: Left;  . COLON SURGERY    . EYE SURGERY     bilateral  . RENAL ANGIOGRAPHY Bilateral 11/19/2016   Procedure: Renal Angiography;  Surgeon: Katha Cabal, MD;  Location: [place] [facility];  Service: Cardiovascular;  Laterality: Bilateral;  . RENAL ANGIOGRAPHY Right 07/03/2018   Procedure: RENAL ANGIOGRAPHY;  Surgeon: Katha Cabal, MD;  Location: [place] [facility];  Service: Cardiovascular;  Laterality: Right;  . REVERSE SHOULDER ARTHROPLASTY Left 09/19/2016   Procedure: REVERSE SHOULDER ARTHROPLASTY;  Surgeon: Corky Mull, MD;  Location: [facility];  Service: Orthopedics;  Laterality: Left;  . ROTATOR CUFF REPAIR Right     Social History   Socioeconomic History  . Marital status: Widowed    Spouse name: Not on file  . Number of children: Not on file  . Years of education: Not on file  . Highest education level: Not on file  Occupational History  . Not on file  Social Needs  . Financial resource strain: Not on file  . Food insecurity    Worry: Not on file    Inability: Not on file  . Transportation needs    Medical: Not on file    Non-medical: Not on file  Tobacco Use  . Smoking  status: Former Smoker    Years: 25.00    Types: Pipe, Cigarettes, Cigars    Quit date: 09/05/1976    Years since quitting: 42.4  . Smokeless tobacco: Never Used  Substance and Sexual Activity  . Alcohol use: Yes    Comment: ocassionally  . Drug use: No  . Sexual activity: Never  Lifestyle  . Physical activity    Days per week: Not on file    Minutes per session: Not on file  . Stress: Not on file  Relationships  . Social Herbalist on phone: Not on file    Gets together: Not on file    Attends religious service: Not on file    Active member of club or organization: Not on file    Attends meetings of clubs or organizations: Not on file     Relationship status: Not on file  . Intimate partner violence    Fear of current or ex partner: Not on file    Emotionally abused: Not on file    Physically abused: Not on file    Forced sexual activity: Not on file  Other Topics Concern  . Not on file  Social History Narrative  . Not on file    Family History  Problem Relation Age of Onset  . Cancer Father     No Known Allergies   Review of Systems   Review of Systems: Negative Unless Checked Constitutional: [] Weight loss  [] Fever  [] Chills Cardiac: [] Chest pain   []  Atrial Fibrillation  [] Palpitations   [] Shortness of breath when laying flat   [] Shortness of breath with exertion. [] Shortness of breath at rest Vascular:  [] Pain in legs with walking   [] Pain in legs with standing [] Pain in legs when laying flat   [] Claudication    [] Pain in feet when laying flat    [] History of DVT   [] Phlebitis   [] Swelling in legs   [] Varicose veins   [] Non-healing ulcers Pulmonary:   [] Uses home oxygen   [] Productive cough   [] Hemoptysis   [] Wheeze  [] COPD   [] Asthma Neurologic:  [] Dizziness   [] Seizures  [] Blackouts [] History of stroke   [] History of TIA  [] Aphasia   [] Temporary Blindness   [x] Weakness or numbness in arm   [] Weakness or numbness in leg Musculoskeletal:   [] Joint swelling   [] Joint pain   [] Low back pain  []  History of Knee Replacement [x] Arthritis [] back Surgeries  []  Spinal Stenosis    Hematologic:  [] Easy bruising  [] Easy bleeding   [] Hypercoagulable state   [x] Anemic Gastrointestinal:  [] Diarrhea   [] Vomiting  [] Gastroesophageal reflux/heartburn   [] Difficulty swallowing. [] Abdominal pain Genitourinary:  [x] Chronic kidney disease   [] Difficult urination  [] Anuric   [] Blood in urine [] Frequent urination  [] Burning with urination   [] Hematuria Skin:  [] Rashes   [] Ulcers [] Wounds Psychological:  [] History of anxiety   []  History of major depression  []  Memory Difficulties      OBJECTIVE:   Physical Exam  BP (!) 162/64 (BP  Location: Right Arm)   Pulse (!) 59   Resp 16   Wt 144 lb (65.3 kg)   BMI 21.90 kg/m   Gen: WD/WN, NAD Head: [place]/AT, No temporalis wasting.  Ear/Nose/Throat: Hearing grossly intact, nares w/o erythema or drainage Eyes: PER, EOMI, sclera nonicteric.  Neck: Supple, no masses.  No JVD.  Pulmonary:  Good air movement, no use of accessory muscles.  Cardiac: RRR Vascular:  Good thrill and bruit Vessel Right Left  Radial Palpable Palpable   Gastrointestinal: soft, non-distended. No guarding/no peritoneal signs.  Musculoskeletal: M/S 5/5 throughout.  No deformity or atrophy.  Neurologic: Pain and light touch intact in extremities.  Symmetrical.  Speech is fluent. Motor exam as listed above. Psychiatric: Judgment intact, Mood & affect appropriate for pt's clinical situation. Dermatologic: No Venous rashes. No Ulcers Noted.  No changes consistent with cellulitis. Lymph : No Cervical lymphadenopathy, no lichenification or skin changes of chronic lymphedema.       ASSESSMENT AND PLAN:  1. Complication of vascular access for dialysis, initial encounter Recommend:  The patient is doing well and currently has adequate dialysis access. The patient's dialysis center is not reporting any access issues. Flow pattern is stable when compared to the prior ultrasound.  The patient should have a duplex ultrasound of the dialysis access in 6 months. The patient will follow-up with me in the office after each ultrasound     2. Benign essential hypertension Continue antihypertensive medications as already ordered, these medications have been reviewed and there are no changes at this time.   3. Type II diabetes mellitus with manifestations ([place]) Continue hypoglycemic medications as already ordered, these medications have been reviewed and there are no changes at this time.  Hgb A1C to be monitored as already arranged by primary service    Current Outpatient Medications on File Prior to Visit   Medication Sig Dispense Refill  . allopurinol (ZYLOPRIM) 100 MG tablet Take 100 mg by mouth at bedtime.     Marland Kitchen amLODipine (NORVASC) 10 MG tablet Take 10 mg by mouth every morning.     Marland Kitchen aspirin EC 81 MG tablet Take 81 mg by mouth at bedtime.     . Blood Glucose Monitoring Suppl (ONE TOUCH ULTRA 2) w/Device KIT USE TO CHECK SUGARS TWICE DAILY    . cloNIDine (CATAPRES) 0.1 MG tablet Take 0.1 mg by mouth every evening.     . clopidogrel (PLAVIX) 75 MG tablet Take 1 tablet (75 mg total) by mouth daily. 90 tablet 4  . LANTUS SOLOSTAR 100 UNIT/ML Solostar Pen Inject 10 Units into the skin every morning.     . simvastatin (ZOCOR) 20 MG tablet Take 20 mg by mouth at bedtime.    Marland Kitchen telmisartan (MICARDIS) 80 MG tablet Take 80 mg by mouth every morning.      No current facility-administered medications on file prior to visit.     There are no Patient Instructions on file for this visit. No follow-ups on file.   Kris Hartmann, NP  This note was completed with Sales executive.  Any errors are purely unintentional.

## 2019-02-23 DIAGNOSIS — R319 Hematuria, unspecified: Secondary | ICD-10-CM | POA: Insufficient documentation

## 2019-02-23 DIAGNOSIS — N189 Chronic kidney disease, unspecified: Secondary | ICD-10-CM | POA: Insufficient documentation

## 2019-02-23 DIAGNOSIS — N2581 Secondary hyperparathyroidism of renal origin: Secondary | ICD-10-CM | POA: Diagnosis not present

## 2019-02-23 DIAGNOSIS — R809 Proteinuria, unspecified: Secondary | ICD-10-CM | POA: Insufficient documentation

## 2019-02-23 DIAGNOSIS — E1122 Type 2 diabetes mellitus with diabetic chronic kidney disease: Secondary | ICD-10-CM | POA: Diagnosis not present

## 2019-02-23 DIAGNOSIS — I701 Atherosclerosis of renal artery: Secondary | ICD-10-CM | POA: Diagnosis not present

## 2019-02-23 DIAGNOSIS — E875 Hyperkalemia: Secondary | ICD-10-CM | POA: Insufficient documentation

## 2019-02-23 DIAGNOSIS — D631 Anemia in chronic kidney disease: Secondary | ICD-10-CM | POA: Insufficient documentation

## 2019-02-23 DIAGNOSIS — I129 Hypertensive chronic kidney disease with stage 1 through stage 4 chronic kidney disease, or unspecified chronic kidney disease: Secondary | ICD-10-CM | POA: Diagnosis not present

## 2019-02-23 DIAGNOSIS — N185 Chronic kidney disease, stage 5: Secondary | ICD-10-CM | POA: Insufficient documentation

## 2019-02-23 DIAGNOSIS — M109 Gout, unspecified: Secondary | ICD-10-CM | POA: Insufficient documentation

## 2019-02-26 DIAGNOSIS — Z23 Encounter for immunization: Secondary | ICD-10-CM | POA: Diagnosis not present

## 2019-03-09 ENCOUNTER — Other Ambulatory Visit: Payer: Self-pay

## 2019-03-10 ENCOUNTER — Other Ambulatory Visit: Payer: Self-pay

## 2019-03-10 ENCOUNTER — Inpatient Hospital Stay: Payer: PPO

## 2019-03-10 ENCOUNTER — Encounter: Payer: Self-pay | Admitting: Oncology

## 2019-03-10 ENCOUNTER — Inpatient Hospital Stay: Payer: PPO | Attending: Oncology | Admitting: Oncology

## 2019-03-10 VITALS — BP 187/67 | HR 60 | Temp 97.1°F | Resp 16 | Wt 146.9 lb

## 2019-03-10 DIAGNOSIS — Z79899 Other long term (current) drug therapy: Secondary | ICD-10-CM | POA: Insufficient documentation

## 2019-03-10 DIAGNOSIS — E1122 Type 2 diabetes mellitus with diabetic chronic kidney disease: Secondary | ICD-10-CM | POA: Diagnosis not present

## 2019-03-10 DIAGNOSIS — I129 Hypertensive chronic kidney disease with stage 1 through stage 4 chronic kidney disease, or unspecified chronic kidney disease: Secondary | ICD-10-CM | POA: Insufficient documentation

## 2019-03-10 DIAGNOSIS — D631 Anemia in chronic kidney disease: Secondary | ICD-10-CM | POA: Diagnosis not present

## 2019-03-10 DIAGNOSIS — D539 Nutritional anemia, unspecified: Secondary | ICD-10-CM

## 2019-03-10 DIAGNOSIS — Z85038 Personal history of other malignant neoplasm of large intestine: Secondary | ICD-10-CM | POA: Diagnosis not present

## 2019-03-10 DIAGNOSIS — E538 Deficiency of other specified B group vitamins: Secondary | ICD-10-CM | POA: Diagnosis not present

## 2019-03-10 DIAGNOSIS — N184 Chronic kidney disease, stage 4 (severe): Secondary | ICD-10-CM | POA: Diagnosis not present

## 2019-03-10 DIAGNOSIS — D472 Monoclonal gammopathy: Secondary | ICD-10-CM

## 2019-03-10 LAB — TSH: TSH: 4.289 u[IU]/mL (ref 0.350–4.500)

## 2019-03-10 LAB — CBC WITH DIFFERENTIAL/PLATELET
Abs Immature Granulocytes: 0.02 10*3/uL (ref 0.00–0.07)
Basophils Absolute: 0 10*3/uL (ref 0.0–0.1)
Basophils Relative: 0 %
Eosinophils Absolute: 0.2 10*3/uL (ref 0.0–0.5)
Eosinophils Relative: 3 %
HCT: 28.2 % — ABNORMAL LOW (ref 39.0–52.0)
Hemoglobin: 9.2 g/dL — ABNORMAL LOW (ref 13.0–17.0)
Immature Granulocytes: 0 %
Lymphocytes Relative: 18 %
Lymphs Abs: 1.1 10*3/uL (ref 0.7–4.0)
MCH: 32.5 pg (ref 26.0–34.0)
MCHC: 32.6 g/dL (ref 30.0–36.0)
MCV: 99.6 fL (ref 80.0–100.0)
Monocytes Absolute: 0.5 10*3/uL (ref 0.1–1.0)
Monocytes Relative: 9 %
Neutro Abs: 4.1 10*3/uL (ref 1.7–7.7)
Neutrophils Relative %: 70 %
Platelets: 169 10*3/uL (ref 150–400)
RBC: 2.83 MIL/uL — ABNORMAL LOW (ref 4.22–5.81)
RDW: 13.6 % (ref 11.5–15.5)
WBC: 5.9 10*3/uL (ref 4.0–10.5)
nRBC: 0 % (ref 0.0–0.2)

## 2019-03-10 LAB — IRON AND TIBC
Iron: 66 ug/dL (ref 45–182)
Saturation Ratios: 23 % (ref 17.9–39.5)
TIBC: 288 ug/dL (ref 250–450)
UIBC: 222 ug/dL

## 2019-03-10 LAB — TECHNOLOGIST SMEAR REVIEW: Plt Morphology: ADEQUATE

## 2019-03-10 LAB — RETIC PANEL
Immature Retic Fract: 14.2 % (ref 2.3–15.9)
RBC.: 2.83 MIL/uL — ABNORMAL LOW (ref 4.22–5.81)
Retic Count, Absolute: 34.8 10*3/uL (ref 19.0–186.0)
Retic Ct Pct: 1.2 % (ref 0.4–3.1)
Reticulocyte Hemoglobin: 36.8 pg (ref >27.9)

## 2019-03-10 LAB — FERRITIN: Ferritin: 44 ng/mL (ref 24–336)

## 2019-03-10 LAB — FOLATE: Folate: 27 ng/mL (ref >5.9)

## 2019-03-10 LAB — VITAMIN B12: Vitamin B-12: 215 pg/mL (ref 180–914)

## 2019-03-10 NOTE — Progress Notes (Signed)
Patient is unaware of why he is being referred to Dr. Tasia Catchings.

## 2019-03-11 LAB — MULTIPLE MYELOMA PANEL, SERUM
Albumin SerPl Elph-Mcnc: 3.9 g/dL (ref 2.9–4.4)
Albumin/Glob SerPl: 1.6 (ref 0.7–1.7)
Alpha 1: 0.3 g/dL (ref 0.0–0.4)
Alpha2 Glob SerPl Elph-Mcnc: 0.8 g/dL (ref 0.4–1.0)
B-Globulin SerPl Elph-Mcnc: 0.9 g/dL (ref 0.7–1.3)
Gamma Glob SerPl Elph-Mcnc: 0.7 g/dL (ref 0.4–1.8)
Globulin, Total: 2.6 g/dL (ref 2.2–3.9)
IgA: 301 mg/dL (ref 61–437)
IgG (Immunoglobin G), Serum: 728 mg/dL (ref 603–1613)
IgM (Immunoglobulin M), Srm: 49 mg/dL (ref 15–143)
Total Protein ELP: 6.5 g/dL (ref 6.0–8.5)

## 2019-03-11 LAB — KAPPA/LAMBDA LIGHT CHAINS
Kappa free light chain: 105.7 mg/L — ABNORMAL HIGH (ref 3.3–19.4)
Kappa, lambda light chain ratio: 4.08 — ABNORMAL HIGH (ref 0.26–1.65)
Lambda free light chains: 25.9 mg/L (ref 5.7–26.3)

## 2019-03-11 LAB — CEA: CEA: 1.8 ng/mL (ref 0.0–4.7)

## 2019-03-13 NOTE — Progress Notes (Signed)
Hematology/Oncology Consult note [facility] Telephone:([phone] [phone]   Patient Care Team: [name], MD as PCP - General (Internal Medicine)  REFERRING PROVIDER: Lavonia Dana, MD  CHIEF COMPLAINTS/REASON FOR VISIT:  Evaluation of anemia in chronic kidney disease.  HISTORY OF PRESENTING ILLNESS:   Dylan Sanders is a  85 y.o.  male with PMH listed below was seen in consultation at the request of  Kolluru, Lurena Nida, MD  for evaluation of anemia and chronic kidney disease. Patient has chronic kidney disease, secondary to longstanding high blood pressure.  Follows up with nephrology. He has had AV graft placed and is matured.  Last seen by nephrology on 02/23/2019, no indication to start dialysis yet. 02/11/2019, hemoglobin 8.5, hematocrit 26.3, MCV 102.3, Creatinine 3.86, EGFR 15. Negative hepatitis panel Patient was referred to heme-onc for evaluation of anemia in the setting of chronic kidney disease. Patient reports feeling well.  Chronic fatigue at baseline. Appetite is fair. Denies weight loss, fever, chills, fatigue, night sweats.  #Reported remote history of colon cancer 2008.  Status post resection. #Diarrhea, chronic.  No blood in the stool.  Review of Systems  Constitutional: Positive for fatigue. Negative for appetite change, chills, fever and unexpected weight change.  HENT:   Negative for hearing loss and voice change.   Eyes: Negative for eye problems and icterus.  Respiratory: Negative for chest tightness, cough and shortness of breath.   Cardiovascular: Negative for chest pain and leg swelling.  Gastrointestinal: Positive for diarrhea. Negative for abdominal distention and abdominal pain.  Endocrine: Negative for hot flashes.  Genitourinary: Negative for difficulty urinating, dysuria and frequency.   Musculoskeletal: Negative for arthralgias.  Skin: Negative for itching and rash.  Neurological: Negative for  light-headedness and numbness.  Hematological: Negative for adenopathy. Does not bruise/bleed easily.  Psychiatric/Behavioral: Negative for confusion.    MEDICAL HISTORY:  Past Medical History:  Diagnosis Date   Anemia    Cancer ([place]) 2008   colon   Chronic kidney disease    Diabetes mellitus without complication ([place])    Heart murmur    asymptomatic   Hypertension     SURGICAL HISTORY: Past Surgical History:  Procedure Laterality Date   AV FISTULA PLACEMENT Left 01/08/2019   Procedure: INSERTION OF ARTERIOVENOUS (AV) GORE-TEX GRAFT ARM ( BRACHIAL AXILLARY );  Surgeon: Katha Cabal, MD;  Location: [facility];  Service: Vascular;  Laterality: Left;   COLON SURGERY     EYE SURGERY     bilateral   RENAL ANGIOGRAPHY Bilateral 11/19/2016   Procedure: Renal Angiography;  Surgeon: Katha Cabal, MD;  Location: [facility];  Service: Cardiovascular;  Laterality: Bilateral;   RENAL ANGIOGRAPHY Right 07/03/2018   Procedure: RENAL ANGIOGRAPHY;  Surgeon: Katha Cabal, MD;  Location: [facility];  Service: Cardiovascular;  Laterality: Right;   REVERSE SHOULDER ARTHROPLASTY Left 09/19/2016   Procedure: REVERSE SHOULDER ARTHROPLASTY;  Surgeon: Corky Mull, MD;  Location: [facility];  Service: Orthopedics;  Laterality: Left;   ROTATOR CUFF REPAIR Right     SOCIAL HISTORY: Social History   Socioeconomic History   Marital status: Widowed    Spouse name: Not on file   Number of children: Not on file   Years of education: Not on file   Highest education level: Not on file  Occupational History   Not on file  Social Needs   Financial resource strain: Not on file   Food insecurity    Worry:  Not on file    Inability: Not on file   Transportation needs    Medical: Not on file    Non-medical: Not on file  Tobacco Use   Smoking status: Former Smoker    Years: 25.00    Types: Pipe, Cigarettes, Cigars    Quit date: 09/05/1976    Years  since quitting: 42.5   Smokeless tobacco: Never Used  Substance and Sexual Activity   Alcohol use: Yes    Comment: ocassionally   Drug use: No   Sexual activity: Never  Lifestyle   Physical activity    Days per week: Not on file    Minutes per session: Not on file   Stress: Not on file  Relationships   Social connections    Talks on phone: Not on file    Gets together: Not on file    Attends religious service: Not on file    Active member of club or organization: Not on file    Attends meetings of clubs or organizations: Not on file    Relationship status: Not on file   Intimate partner violence    Fear of current or ex partner: Not on file    Emotionally abused: Not on file    Physically abused: Not on file    Forced sexual activity: Not on file  Other Topics Concern   Not on file  Social History Narrative   Not on file    FAMILY HISTORY: Family History  Problem Relation Age of Onset   Cancer Father     ALLERGIES:  is allergic to ace inhibitors.  MEDICATIONS:  Current Outpatient Medications  Medication Sig Dispense Refill   allopurinol (ZYLOPRIM) 100 MG tablet Take 100 mg by mouth at bedtime.      amLODipine (NORVASC) 10 MG tablet Take 10 mg by mouth every morning.      aspirin EC 81 MG tablet Take 81 mg by mouth at bedtime.      Blood Glucose Monitoring Suppl (ONE TOUCH ULTRA 2) w/Device KIT USE TO CHECK SUGARS TWICE DAILY     cloNIDine (CATAPRES) 0.1 MG tablet Take 0.1 mg by mouth every evening.      clopidogrel (PLAVIX) 75 MG tablet Take 1 tablet (75 mg total) by mouth daily. 90 tablet 4   LANTUS SOLOSTAR 100 UNIT/ML Solostar Pen Inject 10 Units into the skin every morning.      simvastatin (ZOCOR) 20 MG tablet Take 20 mg by mouth at bedtime.     telmisartan (MICARDIS) 80 MG tablet Take 80 mg by mouth every morning.      carvedilol (COREG) 3.125 MG tablet Take by mouth.     No current facility-administered medications for this visit.       PHYSICAL EXAMINATION: ECOG PERFORMANCE STATUS: 1 - Symptomatic but completely ambulatory Vitals:   03/10/19 1446  BP: (!) 187/67  Pulse: 60  Resp: 16  Temp: (!) 97.1 F (36.2 C)   Filed Weights   03/10/19 1446  Weight: 146 lb 14.4 oz (66.6 kg)    Physical Exam Constitutional:      General: He is not in acute distress.    Comments: Walk independently  HENT:     Head: Normocephalic and atraumatic.  Eyes:     General: No scleral icterus.    Pupils: Pupils are equal, round, and reactive to light.  Neck:     Musculoskeletal: Normal range of motion and neck supple.  Cardiovascular:     Rate and  Rhythm: Normal rate and regular rhythm.     Heart sounds: Normal heart sounds.  Pulmonary:     Effort: Pulmonary effort is normal. No respiratory distress.     Breath sounds: No wheezing.  Abdominal:     General: Bowel sounds are normal. There is no distension.     Palpations: Abdomen is soft. There is no mass.     Tenderness: There is no abdominal tenderness.  Musculoskeletal: Normal range of motion.        General: No deformity.  Skin:    General: Skin is warm and dry.     Findings: No erythema or rash.  Neurological:     Mental Status: He is alert and oriented to person, place, and time.     Cranial Nerves: No cranial nerve deficit.     Coordination: Coordination normal.  Psychiatric:        Behavior: Behavior normal.        Thought Content: Thought content normal.     LABORATORY DATA:  I have reviewed the data as listed Lab Results  Component Value Date   WBC 5.9 03/10/2019   HGB 9.2 (L) 03/10/2019   HCT 28.2 (L) 03/10/2019   MCV 99.6 03/10/2019   PLT 169 03/10/2019   Recent Labs    07/03/18 0921 12/25/18 1155 01/08/19 0629  NA 142 142 140  K 3.7 5.2* 4.1  CL 111 112*  --   CO2 23 24  --   GLUCOSE 46* 97 128*  BUN 39* 55*  --   CREATININE 3.31* 3.99*  --   CALCIUM 8.7* 8.9  --   GFRNONAA 16* 13*  --   GFRAA 19* 15*  --    Iron/TIBC/Ferritin/  %Sat    Component Value Date/Time   IRON 66 03/10/2019 1527   TIBC 288 03/10/2019 1527   FERRITIN 44 03/10/2019 1527   IRONPCTSAT 23 03/10/2019 1527      RADIOGRAPHIC STUDIES: I have personally reviewed the radiological images as listed and agreed with the findings in the report.  Vas US Duplex Dialysis Access (avf, Avg)  Result Date: 02/22/2019 DIALYSIS ACCESS Reason for Exam: Arm swelling. Access Site: Left Upper Extremity. Access Type: Brachial-Axillary AVG. History: 01/05/2019: Left Brach-Ax AVG placement;. Performing Technologist: Charlane Ferretti RT (R)(VS) Supporting Technologist: Blondell Reveal RT, RDMS, RVT  Examination Guidelines: A complete evaluation includes B-mode imaging, spectral Doppler, color Doppler, and power Doppler as needed of all accessible portions of each vessel. Unilateral testing is considered an integral part of a complete examination. Limited examinations for reoccurring indications may be performed as noted.  Findings:   +--------------------+----------+-----------------+----------------------------+  AVG                  PSV (cm/s) Flow Vol (mL/min)           Describe            +--------------------+----------+-----------------+----------------------------+  Native artery inflow    250           1281                                      +--------------------+----------+-----------------+----------------------------+  Arterial anastomosis    389                                                     +--------------------+----------+-----------------+----------------------------+  Prox graft              137                                                     +--------------------+----------+-----------------+----------------------------+  Mid graft                99                                                     +--------------------+----------+-----------------+----------------------------+  Distal graft            101                                                      +--------------------+----------+-----------------+----------------------------+  Venous anastomosis      488                         change in diameter from                                                           0.53cm graft to 0.23cm                                                           residual basilic vein      +--------------------+----------+-----------------+----------------------------+  Venous outflow          157                                                     +--------------------+----------+-----------------+----------------------------+ Biphasic, antegrade flow in the left distal radial artery with velocities of 83cm/s at rest and 85cm/s during HDA compression.  Summary: Patent left brachial-axillary AVG with no internal vessel narrowing or focal extragraft fluid collection. Hemodynamically significant velocity increase near the venous anastomosis, as described above. Radial artery velocities do not appear to be consistent with a significant access steal. Focally increased velocity near venous anastomosis was not noted on previous exam from 01/27/19, however, this area has not likely had a significant change in the anatomic layout since then.  *See table(s) above for measurements and observations.  Diagnosing physician: Hortencia Pilar MD Electronically signed by Hortencia Pilar MD on 02/22/2019 at 5:22:26 PM.   --------------------------------------------------------------------------------   Final       ASSESSMENT & PLAN:  1. Macrocytic anemia   2. History of colon cancer   3. Stage 4 chronic kidney disease ([place])   4. MGUS (monoclonal gammopathy of  unknown significance)   5. B12 deficiency    Anemia: multifactorial with possible causes including chronic blood loss, hyper/hypothyroidism, nutritional deficiency, infection/chronic inflammation, hemolysis, underlying bone marrow disorders. In the context of CKD, most likely anemia secondary to CKD, rule out other etiologies. Will  check CBC w differential, CMP, vitamin B12, Folate, iron/TIBC, ferritin, reticulocytes, blood smear, TSH,   monoclonal gammopathy evaluation.    Remote history of colon cancer.  Check CEA.  Labs reviewed.  Iron panel shows ferritin 40,  IgA MGUS: Multiple myeloma panel showed no M protein.  Kappa/lambda light chain ratio 4.08.  Free kappa light chain 105. 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 IgA level 3 1. Low normal vitamin B12, macrocytosis.  Will start patient on oral vitamin B-12. Patient follow-up in 2 weeks to discuss lab results and management plan.  Orders Placed This Encounter  Procedures   CBC with Differential/Platelet    Standing Status:   Future    Number of Occurrences:   1    Standing Expiration Date:   03/09/2020   Technologist smear review    Standing Status:   Future    Number of Occurrences:   1    Standing Expiration Date:   03/09/2020   Multiple Myeloma Panel (SPEP&IFE w/QIG)    Standing Status:   Future    Number of Occurrences:   1    Standing Expiration Date:   03/09/2020   Kappa/lambda light chains    Standing Status:   Future    Number of Occurrences:   1    Standing Expiration Date:   03/09/2020   Retic Panel    Standing Status:   Future    Number of Occurrences:   1    Standing Expiration Date:   03/09/2020   TSH    Standing Status:   Future    Number of Occurrences:   1    Standing Expiration Date:   03/09/2020   Vitamin B12    Standing Status:   Future    Number of Occurrences:   1    Standing Expiration Date:   03/09/2020   Folate    Standing Status:   Future    Number of Occurrences:   1    Standing Expiration Date:   03/09/2020   Iron and TIBC    Standing Status:   Future    Number of Occurrences:   1    Standing Expiration Date:   03/09/2020   Ferritin    Standing Status:   Future    Number of Occurrences:   1    Standing Expiration Date:   03/09/2020   CEA    Standing  Status:   Future    Number of Occurrences:   1    Standing Expiration Date:   03/09/2020    All questions were answered. The patient knows to call the clinic with any problems questions or concerns.  cc Kolluru, Lurena Nida, MD    Return of visit: 1 week.  Thank you for this kind referral and the opportunity to participate in the care of this patient. A copy of today's note is routed to referring provider  .[email or web address]  [name], MD, PhD Hematology Oncology [facility] at [facility] Pager- [phone] 03/13/2019

## 2019-03-18 ENCOUNTER — Other Ambulatory Visit: Payer: Self-pay

## 2019-03-18 NOTE — Progress Notes (Signed)
Patient pre screened for office appointment, no questions or concerns today. 

## 2019-03-19 ENCOUNTER — Other Ambulatory Visit: Admission: RE | Admit: 2019-03-19 | Payer: PPO | Source: Home / Self Care | Admitting: Oncology

## 2019-03-19 ENCOUNTER — Inpatient Hospital Stay: Payer: PPO | Attending: Oncology | Admitting: Oncology

## 2019-03-19 ENCOUNTER — Other Ambulatory Visit: Payer: Self-pay

## 2019-03-19 ENCOUNTER — Ambulatory Visit
Admission: RE | Admit: 2019-03-19 | Discharge: 2019-03-19 | Disposition: A | Discharge status: Home / Self Care | Payer: PPO | Source: Ambulatory Visit | Attending: Oncology | Admitting: Oncology

## 2019-03-19 ENCOUNTER — Ambulatory Visit
Admission: RE | Admit: 2019-03-19 | Discharge: 2019-03-19 | Disposition: A | Discharge status: Home / Self Care | Payer: PPO | Attending: Oncology | Admitting: Oncology

## 2019-03-19 VITALS — BP 153/64 | HR 66 | Temp 98.3°F | Resp 16 | Wt 147.5 lb

## 2019-03-19 DIAGNOSIS — D472 Monoclonal gammopathy: Secondary | ICD-10-CM | POA: Insufficient documentation

## 2019-03-19 DIAGNOSIS — D631 Anemia in chronic kidney disease: Secondary | ICD-10-CM

## 2019-03-19 DIAGNOSIS — Z79899 Other long term (current) drug therapy: Secondary | ICD-10-CM | POA: Insufficient documentation

## 2019-03-19 DIAGNOSIS — Z85038 Personal history of other malignant neoplasm of large intestine: Secondary | ICD-10-CM | POA: Diagnosis not present

## 2019-03-19 DIAGNOSIS — N185 Chronic kidney disease, stage 5: Secondary | ICD-10-CM | POA: Diagnosis not present

## 2019-03-19 DIAGNOSIS — E1122 Type 2 diabetes mellitus with diabetic chronic kidney disease: Secondary | ICD-10-CM | POA: Insufficient documentation

## 2019-03-19 DIAGNOSIS — I120 Hypertensive chronic kidney disease with stage 5 chronic kidney disease or end stage renal disease: Secondary | ICD-10-CM | POA: Diagnosis not present

## 2019-03-19 DIAGNOSIS — D539 Nutritional anemia, unspecified: Secondary | ICD-10-CM | POA: Diagnosis not present

## 2019-03-19 MED ORDER — FERROUS SULFATE 325 (65 FE) MG PO TBEC: 325.0000 mg | DELAYED_RELEASE_TABLET | Freq: Two times a day (BID) | ORAL | 1 refills | Status: DC

## 2019-03-19 MED ORDER — VITAMIN B-12 1000 MCG PO TABS: 1000.0000 ug | ORAL_TABLET | Freq: Every day | ORAL | 0 refills | Status: AC

## 2019-03-20 ENCOUNTER — Encounter: Payer: Self-pay | Admitting: Oncology

## 2019-03-20 DIAGNOSIS — N185 Chronic kidney disease, stage 5: Secondary | ICD-10-CM | POA: Insufficient documentation

## 2019-03-20 DIAGNOSIS — D539 Nutritional anemia, unspecified: Secondary | ICD-10-CM | POA: Insufficient documentation

## 2019-03-20 DIAGNOSIS — D472 Monoclonal gammopathy: Secondary | ICD-10-CM | POA: Insufficient documentation

## 2019-03-20 DIAGNOSIS — D631 Anemia in chronic kidney disease: Secondary | ICD-10-CM | POA: Insufficient documentation

## 2019-03-20 NOTE — Progress Notes (Signed)
Hematology/Oncology Consult note [facility] Telephone:([phone] [phone]   Patient Care Team: [name], MD as PCP - General (Internal Medicine)  REFERRING PROVIDER: [name], MD  CHIEF COMPLAINTS/REASON FOR VISIT:  Evaluation of anemia in chronic kidney disease.  HISTORY OF PRESENTING ILLNESS:   Dylan Sanders is a  85 y.o.  male with PMH listed below was seen in consultation at the request of  [name], MD  for evaluation of anemia and chronic kidney disease. Patient has chronic kidney disease, secondary to longstanding high blood pressure.  Follows up with nephrology. He has had AV graft placed and is matured.  Last seen by nephrology on 02/23/2019, no indication to start dialysis yet. 02/11/2019, hemoglobin 8.5, hematocrit 26.3, MCV 102.3, Creatinine 3.86, EGFR 15. Negative hepatitis panel Patient was referred to heme-onc for evaluation of anemia in the setting of chronic kidney disease. Patient reports feeling well.  Chronic fatigue at baseline. Appetite is fair. Denies weight loss, fever, chills, fatigue, night sweats.  #Reported remote history of colon cancer 2008.  Status post resection. #Diarrhea, chronic.  No blood in the stool.  INTERVAL HISTORY Dylan Sanders is a 85 y.o. male who has above history reviewed by me today presents for follow up visit for management of anemia and chronic disease. Problems and complaints are listed below:  he has had blood work done during the interval and present to discuss results.  Review of Systems  Constitutional: Positive for fatigue. Negative for appetite change, chills, fever and unexpected weight change.  HENT:   Negative for hearing loss and voice change.   Eyes: Negative for eye problems and icterus.  Respiratory: Negative for chest tightness, cough and shortness of breath.   Cardiovascular: Negative for chest pain and leg swelling.  Gastrointestinal: Negative  for abdominal distention, abdominal pain and diarrhea.  Endocrine: Negative for hot flashes.  Genitourinary: Negative for difficulty urinating, dysuria and frequency.   Musculoskeletal: Negative for arthralgias.  Skin: Negative for itching and rash.  Neurological: Negative for light-headedness and numbness.  Hematological: Negative for adenopathy. Does not bruise/bleed easily.  Psychiatric/Behavioral: Negative for confusion.    MEDICAL HISTORY:  Past Medical History:  Diagnosis Date   Anemia    Cancer ([place]) 2008   colon   Chronic kidney disease    Diabetes mellitus without complication ([place])    Heart murmur    asymptomatic   Hypertension     SURGICAL HISTORY: Past Surgical History:  Procedure Laterality Date   AV FISTULA PLACEMENT Left 01/08/2019   Procedure: INSERTION OF ARTERIOVENOUS (AV) GORE-TEX GRAFT ARM ( BRACHIAL AXILLARY );  Surgeon: Katha Cabal, MD;  Location: [facility];  Service: Vascular;  Laterality: Left;   COLON SURGERY     EYE SURGERY     bilateral   RENAL ANGIOGRAPHY Bilateral 11/19/2016   Procedure: Renal Angiography;  Surgeon: Katha Cabal, MD;  Location: [place] [facility];  Service: Cardiovascular;  Laterality: Bilateral;   RENAL ANGIOGRAPHY Right 07/03/2018   Procedure: RENAL ANGIOGRAPHY;  Surgeon: Katha Cabal, MD;  Location: [place] [facility];  Service: Cardiovascular;  Laterality: Right;   REVERSE SHOULDER ARTHROPLASTY Left 09/19/2016   Procedure: REVERSE SHOULDER ARTHROPLASTY;  Surgeon: Corky Mull, MD;  Location: [facility] ORS;  Service: Orthopedics;  Laterality: Left;   ROTATOR CUFF REPAIR Right     SOCIAL HISTORY: Social History   Socioeconomic History   Marital status: Widowed    Spouse name: Not on  file   Number of children: Not on file   Years of education: Not on file   Highest education level: Not on file  Occupational History   Not on file  Social Needs   Financial resource strain: Not on file    Food insecurity    Worry: Not on file    Inability: Not on file   Transportation needs    Medical: Not on file    Non-medical: Not on file  Tobacco Use   Smoking status: Former Smoker    Years: 25.00    Types: Pipe, Cigarettes, Cigars    Quit date: 09/05/1976    Years since quitting: 42.5   Smokeless tobacco: Never Used  Substance and Sexual Activity   Alcohol use: Yes    Comment: ocassionally   Drug use: No   Sexual activity: Never  Lifestyle   Physical activity    Days per week: Not on file    Minutes per session: Not on file   Stress: Not on file  Relationships   Social connections    Talks on phone: Not on file    Gets together: Not on file    Attends religious service: Not on file    Active member of club or organization: Not on file    Attends meetings of clubs or organizations: Not on file    Relationship status: Not on file   Intimate partner violence    Fear of current or ex partner: Not on file    Emotionally abused: Not on file    Physically abused: Not on file    Forced sexual activity: Not on file  Other Topics Concern   Not on file  Social History Narrative   Not on file    FAMILY HISTORY: Family History  Problem Relation Age of Onset   Cancer Father     ALLERGIES:  is allergic to ace inhibitors.  MEDICATIONS:  Current Outpatient Medications  Medication Sig Dispense Refill   allopurinol (ZYLOPRIM) 100 MG tablet Take 100 mg by mouth at bedtime.      amLODipine (NORVASC) 10 MG tablet Take 10 mg by mouth every morning.      aspirin EC 81 MG tablet Take 81 mg by mouth at bedtime.      Blood Glucose Monitoring Suppl (ONE TOUCH ULTRA 2) w/Device KIT USE TO CHECK SUGARS TWICE DAILY     carvedilol (COREG) 3.125 MG tablet Take by mouth.     cloNIDine (CATAPRES) 0.1 MG tablet Take 0.1 mg by mouth every evening.      clopidogrel (PLAVIX) 75 MG tablet Take 1 tablet (75 mg total) by mouth daily. 90 tablet 4   LANTUS SOLOSTAR 100 UNIT/ML  Solostar Pen Inject 10 Units into the skin every morning.      simvastatin (ZOCOR) 20 MG tablet Take 20 mg by mouth at bedtime.     telmisartan (MICARDIS) 80 MG tablet Take 80 mg by mouth every morning.      ferrous sulfate 325 (65 FE) MG EC tablet Take 1 tablet (325 mg total) by mouth 2 (two) times daily. 60 tablet 1   vitamin B-12 (CYANOCOBALAMIN) 1000 MCG tablet Take 1 tablet (1,000 mcg total) by mouth daily. 90 tablet 0   No current facility-administered medications for this visit.      PHYSICAL EXAMINATION: ECOG PERFORMANCE STATUS: 1 - Symptomatic but completely ambulatory Vitals:   03/19/19 1356  BP: (!) 153/64  Pulse: 66  Resp: 16  Temp: 98.3 F (  36.8 C)  SpO2: 100%   Filed Weights   03/19/19 1356  Weight: 147 lb 8 oz (66.9 kg)    Physical Exam Constitutional:      General: He is not in acute distress.    Comments: Walk independently  HENT:     Head: Normocephalic and atraumatic.  Eyes:     General: No scleral icterus.    Pupils: Pupils are equal, round, and reactive to light.  Neck:     Musculoskeletal: Normal range of motion and neck supple.  Cardiovascular:     Rate and Rhythm: Normal rate and regular rhythm.     Heart sounds: Normal heart sounds.  Pulmonary:     Effort: Pulmonary effort is normal. No respiratory distress.     Breath sounds: No wheezing.  Abdominal:     General: Bowel sounds are normal. There is no distension.     Palpations: Abdomen is soft. There is no mass.     Tenderness: There is no abdominal tenderness.  Musculoskeletal: Normal range of motion.        General: No deformity.  Skin:    General: Skin is warm and dry.     Findings: No erythema or rash.  Neurological:     Mental Status: He is alert and oriented to person, place, and time.     Cranial Nerves: No cranial nerve deficit.     Coordination: Coordination normal.  Psychiatric:        Behavior: Behavior normal.        Thought Content: Thought content normal.      LABORATORY DATA:  I have reviewed the data as listed Lab Results  Component Value Date   WBC 5.9 03/10/2019   HGB 9.2 (L) 03/10/2019   HCT 28.2 (L) 03/10/2019   MCV 99.6 03/10/2019   PLT 169 03/10/2019   Recent Labs    07/03/18 0921 12/25/18 1155 01/08/19 0629  NA 142 142 140  K 3.7 5.2* 4.1  CL 111 112*  --   CO2 23 24  --   GLUCOSE 46* 97 128*  BUN 39* 55*  --   CREATININE 3.31* 3.99*  --   CALCIUM 8.7* 8.9  --   GFRNONAA 16* 13*  --   GFRAA 19* 15*  --    Iron/TIBC/Ferritin/ %Sat    Component Value Date/Time   IRON 66 03/10/2019 1527   TIBC 288 03/10/2019 1527   FERRITIN 44 03/10/2019 1527   IRONPCTSAT 23 03/10/2019 1527      RADIOGRAPHIC STUDIES: I have personally reviewed the radiological images as listed and agreed with the findings in the report.  Vas US Duplex Dialysis Access (avf, Avg)  Result Date: 02/22/2019 DIALYSIS ACCESS Reason for Exam: Arm swelling. Access Site: Left Upper Extremity. Access Type: Brachial-Axillary AVG. History: 01/05/2019: Left Brach-Ax AVG placement;. Performing Technologist: Charlane Ferretti RT (R)(VS) Supporting Technologist: Blondell Reveal RT, RDMS, RVT  Examination Guidelines: A complete evaluation includes B-mode imaging, spectral Doppler, color Doppler, and power Doppler as needed of all accessible portions of each vessel. Unilateral testing is considered an integral part of a complete examination. Limited examinations for reoccurring indications may be performed as noted.  Findings:   +--------------------+----------+-----------------+----------------------------+  AVG                  PSV (cm/s) Flow Vol (mL/min)           Describe            +--------------------+----------+-----------------+----------------------------+  Native artery inflow  250           1281                                      +--------------------+----------+-----------------+----------------------------+  Arterial anastomosis    389                                                      +--------------------+----------+-----------------+----------------------------+  Prox graft              137                                                     +--------------------+----------+-----------------+----------------------------+  Mid graft                99                                                     +--------------------+----------+-----------------+----------------------------+  Distal graft            101                                                     +--------------------+----------+-----------------+----------------------------+  Venous anastomosis      488                         change in diameter from                                                           0.53cm graft to 0.23cm                                                           residual basilic vein      +--------------------+----------+-----------------+----------------------------+  Venous outflow          157                                                     +--------------------+----------+-----------------+----------------------------+ Biphasic, antegrade flow in the left distal radial artery with velocities of 83cm/s at rest and 85cm/s during HDA compression.  Summary: Patent left brachial-axillary AVG with no internal vessel narrowing or focal extragraft fluid collection. Hemodynamically significant velocity increase near the venous anastomosis, as described above.  Radial artery velocities do not appear to be consistent with a significant access steal. Focally increased velocity near venous anastomosis was not noted on previous exam from 01/27/19, however, this area has not likely had a significant change in the anatomic layout since then.  *See table(s) above for measurements and observations.  Diagnosing physician: Hortencia Pilar MD Electronically signed by Hortencia Pilar MD on 02/22/2019 at 5:22:26 PM.    --------------------------------------------------------------------------------   Final       ASSESSMENT & PLAN:  1. MGUS (monoclonal gammopathy of unknown significance)   2. Macrocytic anemia   3. History of colon cancer   4. Anemia in stage 5 chronic kidney disease, not on chronic dialysis ([place])   5. CKD (chronic kidney disease) stage 5, GFR less than 15 ml/min (HCC)    #Labs are reviewed and discussed with patient. -MGUS Multiple myeloma showed no M protein, however immunofixation showed IgA monoclonal protein with kappa light chain specificity. Light chain showed increased kappa light chain 105.7, increased light chain ratio 4.08. Diagnosis of IgA MGUS was discussed. I discussed with patient about the diagnosis of IgA MGUS which is an asymptomatic condition which has a small risk of progression to malignant plasma cell dyscrasia or lymphoproliferative disorder.  For now I recommend observation. Check SPEP and light chain ratio every 6 months.  Check skeletal bone survey.  Check random urine protein electrophoresis.  #Anemia in chronic kidney disease. Iron panel showed iron saturation 23, ferritin 44.  TIBC 288. I recommend to further increase iron store, hopefully to further increase his hemoglobin to be above 10. Discussed about oral ferrous sulfate supplementation and IV iron supplementation options. Patient prefers to start with oral iron supplementation first.  Ferrous sulfate 325 mg twice daily with meal prescription was sent to pharmacy.  # Low normal B12 level/macrocytosis Recommend patient to start oral vitamin B12 supplementation.  Remote history of colon cancer.  CEA is normal. # CKD Stage 5, patient has about to start dialysis in the next few months.  Continue follow-up with nephrology. Patient follow-up in 4 weeks to discuss lab results and management plan.  Orders Placed This Encounter  Procedures   DG Bone Survey Met    Standing Status:   Future    Number of  Occurrences:   1    Standing Expiration Date:   05/18/2020    Order Specific Question:   Reason for Exam (SYMPTOM  OR DIAGNOSIS REQUIRED)    Answer:   MGUS    Order Specific Question:   Preferred imaging location?    Answer:   [facility]    Order Specific Question:   Radiology Contrast Protocol - do NOT remove file path    Answer:   [email or web address]   CBC with Differential    Standing Status:   Future    Standing Expiration Date:   03/18/2020   Ferritin    Standing Status:   Future    Standing Expiration Date:   03/18/2020   Iron and TIBC    Standing Status:   Future    Standing Expiration Date:   03/18/2020   Vitamin B12    Standing Status:   Future    Standing Expiration Date:   03/18/2020    All questions were answered. The patient knows to call the clinic with any problems questions or concerns.  cc [name], MD    Return of visit:  4 weeks.   Thank you for this kind referral and the opportunity to  participate in the care of this patient. A copy of today's note is routed to referring provider    [name], MD, PhD Hematology Oncology [facility] Pager- [phone] 03/20/2019

## 2019-03-25 DIAGNOSIS — E118 Type 2 diabetes mellitus with unspecified complications: Secondary | ICD-10-CM | POA: Diagnosis not present

## 2019-03-25 DIAGNOSIS — I38 Endocarditis, valve unspecified: Secondary | ICD-10-CM | POA: Diagnosis not present

## 2019-03-25 DIAGNOSIS — N184 Chronic kidney disease, stage 4 (severe): Secondary | ICD-10-CM | POA: Diagnosis not present

## 2019-03-25 DIAGNOSIS — E7800 Pure hypercholesterolemia, unspecified: Secondary | ICD-10-CM | POA: Diagnosis not present

## 2019-03-25 DIAGNOSIS — Z79899 Other long term (current) drug therapy: Secondary | ICD-10-CM | POA: Diagnosis not present

## 2019-03-25 DIAGNOSIS — I10 Essential (primary) hypertension: Secondary | ICD-10-CM | POA: Diagnosis not present

## 2019-03-29 ENCOUNTER — Ambulatory Visit: Payer: PPO | Admitting: Oncology

## 2019-04-06 NOTE — Progress Notes (Signed)
Cardiology Office Note  Date:  04/07/2019   ID:  Dylan Sanders, DOB 04-25-1932, MRN [id number]  PCP:  [name], MD   Chief Complaint  Patient presents with  . Other    12 month follow up. Patient denies chest pain and SOB.  Meds reviewed verbally with patient.     HPI:  [name] is a 85 year old gentleman with past medical history of Hypertension Type 2 diabetes Chronic kidney disease, stage IV, creatinine 3.99 Renal artery stenosis, left renal artery is a total occlusion and that the right renal artery is only 50% stenosis no intervention  Hyperlipidemia, on a statin aortic valve disease, mild insufficiency per the notes Colon cancer Smoking history quit April 1978 MGUS Who presents for follow-up of his aortic valve disease, PAD  Chronic kidney disease stage IV followed by nephrology Diabetes followed by endocrinology Followed by cancer center for anemia Lab work reviewed creatinine up to 3.99 BUN 50  Being set up to start dialysis AV graft placed August 2020  Started on B12 and iron  Still bowling, no SOB  BP at home: 283 to 662 systolic Lost 10 to 12 pounds  EKG personally reviewed by myself on todays visit  Shows NSR rate 58 bpm no significant ST or T wave changes  Echocardiogram January 2019 Normal LV function, very mild aortic valve stenosis  CT coronary calcium score February 2019  318, LAD and RCA Right middle lobe nodules Aortic atherosclerosis  Other past medical history reviewed  followed by Dr. Ronalee Belts for renal stenosis Angiography of the renal arteries was performed 11/19/2016  Noted to have occlusion of the left renal artery was identified  50% narrowing of the right renal artery was noted. No intervention was performed.   PMH:   has a past medical history of Anemia, Cancer ([place]) (2008), Chronic kidney disease, Diabetes mellitus without complication ([place]), Heart murmur, and Hypertension.  PSH:    Past Surgical History:   Procedure Laterality Date  . AV FISTULA PLACEMENT Left 01/08/2019   Procedure: INSERTION OF ARTERIOVENOUS (AV) GORE-TEX GRAFT ARM ( BRACHIAL AXILLARY );  Surgeon: Katha Cabal, MD;  Location: [facility] OR[facility];  Service: Vascular;  Laterality: Left;  . COLON SURGERY    . EYE SURGERY     bilateral  . RENAL ANGIOGRAPHY Bilateral 11/19/2016   Procedure: Renal Angiography;  Surgeon: Katha Cabal, MD;  Location: [place] [facility];  Service: Cardiovascular;  Laterality: Bilateral;  . RENAL ANGIOGRAPHY Right 07/03/2018   Procedure: RENAL ANGIOGRAPHY;  Surgeon: Katha Cabal, MD;  Location: [place] [facility];  Service: Cardiovascular;  Laterality: Right;  . REVERSE SHOULDER ARTHROPLASTY Left 09/19/2016   Procedure: REVERSE SHOULDER ARTHROPLASTY;  Surgeon: Corky Mull, MD;  Location: [facility];  Service: Orthopedics;  Laterality: Left;  . ROTATOR CUFF REPAIR Right     Current Outpatient Medications  Medication Sig Dispense Refill  . allopurinol (ZYLOPRIM) 100 MG tablet Take 100 mg by mouth at bedtime.     Marland Kitchen amLODipine (NORVASC) 10 MG tablet Take 10 mg by mouth every morning.     Marland Kitchen aspirin EC 81 MG tablet Take 81 mg by mouth at bedtime.     . Blood Glucose Monitoring Suppl (ONE TOUCH ULTRA 2) w/Device KIT USE TO CHECK SUGARS TWICE DAILY    . carvedilol (COREG) 3.125 MG tablet Take 1 tablet (3.125 mg total) by mouth 2 (two) times daily with a meal. 180 tablet 3  . cloNIDine (CATAPRES) 0.1 MG  tablet Take 0.1 mg by mouth 2 (two) times daily.     . clopidogrel (PLAVIX) 75 MG tablet Take 1 tablet (75 mg total) by mouth daily. 90 tablet 4  . ferrous sulfate 325 (65 FE) MG EC tablet Take 1 tablet (325 mg total) by mouth 2 (two) times daily. 60 tablet 1  . LANTUS SOLOSTAR 100 UNIT/ML Solostar Pen Inject 10 Units into the skin every morning.     . simvastatin (ZOCOR) 20 MG tablet Take 20 mg by mouth at bedtime.    . vitamin B-12 (CYANOCOBALAMIN) 1000 MCG tablet Take 1 tablet (1,000 mcg total) by  mouth daily. 90 tablet 0  . telmisartan (MICARDIS) 80 MG tablet Take 80 mg by mouth every morning.      No current facility-administered medications for this visit.     Allergies:   Ace inhibitors   Social History:  The patient  reports that he quit smoking about 42 years ago. His smoking use included pipe, cigarettes, and cigars. He quit after 25.00 years of use. He has never used smokeless tobacco. He reports current alcohol use. He reports that he does not use drugs.   Family History:   family history includes Cancer in his father.    Review of Systems: Review of Systems  Constitutional: Positive for malaise/fatigue.  Respiratory: Negative.   Cardiovascular: Negative.   Gastrointestinal: Negative.   Musculoskeletal: Negative.   Neurological: Negative.   Psychiatric/Behavioral: Negative.   All other systems reviewed and are negative.   PHYSICAL EXAM: VS:  BP (!) 160/60 (BP Location: Left Arm, Patient Position: Sitting, Cuff Size: Normal)   Pulse (!) 58   Ht 5' 8"  (1.727 m)   Wt 146 lb 8 oz (66.5 kg)   BMI 22.28 kg/m  , BMI Body mass index is 22.28 kg/m. Constitutional:  oriented to person, place, and time. No distress.  Appears pale HENT:  Head: Grossly normal Eyes:  no discharge. No scleral icterus.  Neck: No JVD, no carotid bruits  Cardiovascular: Regular rate and rhythm, 2/6 systolic ejection murmur right sternal border Pulmonary/Chest: Clear to auscultation bilaterally, no wheezes or rails Abdominal: Soft.  no distension.  no tenderness.  Musculoskeletal: Normal range of motion Neurological:  normal muscle tone. Coordination normal. No atrophy Skin: Skin warm and dry Psychiatric: normal affect, pleasant   Recent Labs: 12/25/2018: BUN 55; Creatinine, Ser 3.99 01/08/2019: Potassium 4.1; Sodium 140 03/10/2019: Hemoglobin 9.2; Platelets 169; TSH 4.289    Lipid Panel No results found for: CHOL, HDL, LDLCALC, TRIG    Wt Readings from Last 3 Encounters:   04/07/19 146 lb 8 oz (66.5 kg)  03/19/19 147 lb 8 oz (66.9 kg)  03/10/19 146 lb 14.4 oz (66.6 kg)      ASSESSMENT AND PLAN:  Renal artery stenosis (HCC) -  Managed by Dr. Ronalee Belts, occluded on left,  S/p stent 06/2018  Chronic kidney disease, stage IV Has AV graft, Not on HD yet Followed by nephrology  Type 2 diabetes mellitus with stage 4 chronic kidney disease, with long-term current use of insulin ([place]) -  He has been losing weight Low HBA1C  Mixed hyperlipidemia -  On a statin.  Recommend goal LDL less than 70  Essential hypertension -  BP mildly elevated, up and down, Could increase clonidine if needed  Carotid bruit <39% b/l carotid disease on ultrasound January 2019  Aortic valve disease Mild aortic valve stenosis, in 2019 Prominent murmur on exam We will order an echo  Disposition:  F/U  12 months   Total encounter time more than 25 minutes  Greater than 50% was spent in counseling and coordination of care with the patient    Orders Placed This Encounter  Procedures  . EKG 12-Lead  . ECHOCARDIOGRAM COMPLETE     Signed, Esmond Plants, M.D., Ph.D. 04/07/2019  [place], [place]

## 2019-04-07 ENCOUNTER — Encounter: Payer: Self-pay | Admitting: Cardiovascular Disease

## 2019-04-07 ENCOUNTER — Ambulatory Visit (INDEPENDENT_AMBULATORY_CARE_PROVIDER_SITE_OTHER): Payer: PPO | Admitting: Cardiovascular Disease

## 2019-04-07 ENCOUNTER — Other Ambulatory Visit: Payer: Self-pay

## 2019-04-07 VITALS — BP 160/60 | HR 58 | Ht 68.0 in | Wt 146.5 lb

## 2019-04-07 DIAGNOSIS — Z794 Long term (current) use of insulin: Secondary | ICD-10-CM | POA: Diagnosis not present

## 2019-04-07 DIAGNOSIS — E1122 Type 2 diabetes mellitus with diabetic chronic kidney disease: Secondary | ICD-10-CM | POA: Diagnosis not present

## 2019-04-07 DIAGNOSIS — I739 Peripheral vascular disease, unspecified: Secondary | ICD-10-CM

## 2019-04-07 DIAGNOSIS — I779 Disorder of arteries and arterioles, unspecified: Secondary | ICD-10-CM | POA: Diagnosis not present

## 2019-04-07 DIAGNOSIS — I359 Nonrheumatic aortic valve disorder, unspecified: Secondary | ICD-10-CM

## 2019-04-07 DIAGNOSIS — E782 Mixed hyperlipidemia: Secondary | ICD-10-CM

## 2019-04-07 DIAGNOSIS — I701 Atherosclerosis of renal artery: Secondary | ICD-10-CM | POA: Diagnosis not present

## 2019-04-07 DIAGNOSIS — N184 Chronic kidney disease, stage 4 (severe): Secondary | ICD-10-CM

## 2019-04-07 DIAGNOSIS — I10 Essential (primary) hypertension: Secondary | ICD-10-CM

## 2019-04-07 MED ORDER — CARVEDILOL 3.125 MG PO TABS: 3.1250 mg | ORAL_TABLET | Freq: Two times a day (BID) | ORAL | 3 refills | Status: DC

## 2019-04-07 NOTE — Patient Instructions (Addendum)
Medication Instructions:  No changes  If you need a refill on your cardiac medications before your next appointment, please call your pharmacy.    Lab work: No new labs needed   If you have labs (blood work) drawn today and your tests are completely normal, you will receive your results only by: . MyChart Message (if you have MyChart) OR . A paper copy in the mail If you have any lab test that is abnormal or we need to change your treatment, we will call you to review the results.   Testing/Procedures: Echo for aortic valve stenosis Your physician has requested that you have an echocardiogram. Echocardiography is a painless test that uses sound waves to create images of your heart. It provides your doctor with information about the size and shape of your heart and how well your heart's chambers and valves are working. This procedure takes approximately one hour. There are no restrictions for this procedure.     Follow-Up: At [facility], you and your health needs are our priority.  As part of our continuing mission to provide you with exceptional heart care, we have created designated Provider Care Teams.  These Care Teams include your primary Cardiologist (physician) and Advanced Practice Providers (APPs -  Physician Assistants and Nurse Practitioners) who all work together to provide you with the care you need, when you need it.  . You will need a follow up appointment in 12 months   . Providers on your designated Care Team:   . Christopher Berge, NP . Ryan Dunn, PA-C . Jacquelyn Visser, PA-C  Any Other Special Instructions Will Be Listed Below (If Applicable).  For educational health videos Log in to : [email or web address] Or : [email or web address], password : [email or web address]   

## 2019-04-13 ENCOUNTER — Other Ambulatory Visit: Payer: Self-pay

## 2019-04-14 ENCOUNTER — Other Ambulatory Visit: Payer: Self-pay

## 2019-04-14 ENCOUNTER — Inpatient Hospital Stay: Payer: PPO | Attending: Oncology

## 2019-04-14 DIAGNOSIS — Z85038 Personal history of other malignant neoplasm of large intestine: Secondary | ICD-10-CM | POA: Insufficient documentation

## 2019-04-14 DIAGNOSIS — I120 Hypertensive chronic kidney disease with stage 5 chronic kidney disease or end stage renal disease: Secondary | ICD-10-CM | POA: Insufficient documentation

## 2019-04-14 DIAGNOSIS — Z992 Dependence on renal dialysis: Secondary | ICD-10-CM | POA: Insufficient documentation

## 2019-04-14 DIAGNOSIS — D472 Monoclonal gammopathy: Secondary | ICD-10-CM | POA: Diagnosis not present

## 2019-04-14 DIAGNOSIS — D539 Nutritional anemia, unspecified: Secondary | ICD-10-CM

## 2019-04-14 DIAGNOSIS — N185 Chronic kidney disease, stage 5: Secondary | ICD-10-CM | POA: Insufficient documentation

## 2019-04-14 DIAGNOSIS — E538 Deficiency of other specified B group vitamins: Secondary | ICD-10-CM | POA: Diagnosis not present

## 2019-04-14 DIAGNOSIS — E1122 Type 2 diabetes mellitus with diabetic chronic kidney disease: Secondary | ICD-10-CM | POA: Insufficient documentation

## 2019-04-14 DIAGNOSIS — Z79899 Other long term (current) drug therapy: Secondary | ICD-10-CM | POA: Diagnosis not present

## 2019-04-14 DIAGNOSIS — D631 Anemia in chronic kidney disease: Secondary | ICD-10-CM | POA: Insufficient documentation

## 2019-04-14 LAB — CBC WITH DIFFERENTIAL/PLATELET
Abs Immature Granulocytes: 0.01 10*3/uL (ref 0.00–0.07)
Basophils Absolute: 0 10*3/uL (ref 0.0–0.1)
Basophils Relative: 1 %
Eosinophils Absolute: 0.2 10*3/uL (ref 0.0–0.5)
Eosinophils Relative: 5 %
HCT: 27 % — ABNORMAL LOW (ref 39.0–52.0)
Hemoglobin: 8.7 g/dL — ABNORMAL LOW (ref 13.0–17.0)
Immature Granulocytes: 0 %
Lymphocytes Relative: 29 %
Lymphs Abs: 1 10*3/uL (ref 0.7–4.0)
MCH: 32.5 pg (ref 26.0–34.0)
MCHC: 32.2 g/dL (ref 30.0–36.0)
MCV: 100.7 fL — ABNORMAL HIGH (ref 80.0–100.0)
Monocytes Absolute: 0.6 10*3/uL (ref 0.1–1.0)
Monocytes Relative: 15 %
Neutro Abs: 1.8 10*3/uL (ref 1.7–7.7)
Neutrophils Relative %: 50 %
Platelets: 134 10*3/uL — ABNORMAL LOW (ref 150–400)
RBC: 2.68 MIL/uL — ABNORMAL LOW (ref 4.22–5.81)
RDW: 13.7 % (ref 11.5–15.5)
WBC: 3.6 10*3/uL — ABNORMAL LOW (ref 4.0–10.5)
nRBC: 0 % (ref 0.0–0.2)

## 2019-04-14 LAB — IRON AND TIBC
Iron: 73 ug/dL (ref 45–182)
Saturation Ratios: 30 % (ref 17.9–39.5)
TIBC: 240 ug/dL — ABNORMAL LOW (ref 250–450)
UIBC: 167 ug/dL

## 2019-04-14 LAB — VITAMIN B12: Vitamin B-12: 840 pg/mL (ref 180–914)

## 2019-04-14 LAB — FERRITIN: Ferritin: 62 ng/mL (ref 24–336)

## 2019-04-16 ENCOUNTER — Inpatient Hospital Stay (HOSPITAL_BASED_OUTPATIENT_CLINIC_OR_DEPARTMENT_OTHER): Payer: PPO | Admitting: Oncology

## 2019-04-16 ENCOUNTER — Inpatient Hospital Stay: Payer: PPO

## 2019-04-16 ENCOUNTER — Other Ambulatory Visit: Payer: Self-pay

## 2019-04-16 ENCOUNTER — Other Ambulatory Visit: Payer: PPO

## 2019-04-16 ENCOUNTER — Encounter: Payer: Self-pay | Admitting: Oncology

## 2019-04-16 VITALS — BP 168/69 | HR 57 | Temp 97.0°F | Resp 16 | Wt 148.6 lb

## 2019-04-16 DIAGNOSIS — D631 Anemia in chronic kidney disease: Secondary | ICD-10-CM

## 2019-04-16 DIAGNOSIS — Z85038 Personal history of other malignant neoplasm of large intestine: Secondary | ICD-10-CM | POA: Diagnosis not present

## 2019-04-16 DIAGNOSIS — D472 Monoclonal gammopathy: Secondary | ICD-10-CM | POA: Diagnosis not present

## 2019-04-16 DIAGNOSIS — N185 Chronic kidney disease, stage 5: Secondary | ICD-10-CM | POA: Diagnosis not present

## 2019-04-16 MED ORDER — VITAMIN C 500 MG PO TABS: 500.0000 mg | ORAL_TABLET | Freq: Every day | ORAL | 0 refills | Status: AC

## 2019-04-16 MED ORDER — FERROUS SULFATE 325 (65 FE) MG PO TBEC: 325.0000 mg | DELAYED_RELEASE_TABLET | Freq: Two times a day (BID) | ORAL | 1 refills | Status: AC

## 2019-04-16 NOTE — Progress Notes (Signed)
Patient here for follow up. States he has had some loose stools since he started iron, but that is due to taking metamucil along with the iron.

## 2019-04-18 NOTE — Progress Notes (Signed)
Hematology/Oncology follow up note [facility] [phone] [phone]   Patient Care Team: [name], MD as PCP - General (Internal Medicine)  REFERRING PROVIDER: [name], MD  CHIEF COMPLAINTS/REASON FOR VISIT:  Follow-up for anemia in chronic kidney disease.  HISTORY OF PRESENTING ILLNESS:   Dylan Sanders is a  85 y.o.  male with PMH listed below was seen in consultation at the request of  [name], MD  for evaluation of anemia and chronic kidney disease. Patient has chronic kidney disease, secondary to longstanding high blood pressure.  Follows up with nephrology. He has had AV graft placed and is matured.  Last seen by nephrology on 02/23/2019, no indication to start dialysis yet. 02/11/2019, hemoglobin 8.5, hematocrit 26.3, MCV 102.3, Creatinine 3.86, EGFR 15. Negative hepatitis panel Patient was referred to heme-onc for evaluation of anemia in the setting of chronic kidney disease. Patient reports feeling well.  Chronic fatigue at baseline. Appetite is fair. Denies weight loss, fever, chills, fatigue, night sweats.  #Reported remote history of colon cancer 2008.  Status post resection. #Diarrhea, chronic.  No blood in the stool.  INTERVAL HISTORY Dylan Sanders is a 85 y.o. male who has above history reviewed by me today presents for follow up visit for management of anemia and chronic disease. Problems and complaints are listed below: Patient reports feeling well.  He denies any shortness of breath or fatigue more than his usual baseline. No new complaints. Review of Systems  Constitutional: Positive for fatigue. Negative for appetite change, chills, fever and unexpected weight change.  HENT:   Negative for hearing loss and voice change.   Eyes: Negative for eye problems and icterus.  Respiratory: Negative for chest tightness, cough and shortness of breath.   Cardiovascular: Negative for chest pain and  leg swelling.  Gastrointestinal: Negative for abdominal distention, abdominal pain and diarrhea.  Endocrine: Negative for hot flashes.  Genitourinary: Negative for difficulty urinating, dysuria and frequency.   Musculoskeletal: Negative for arthralgias.  Skin: Negative for itching and rash.  Neurological: Negative for light-headedness and numbness.  Hematological: Negative for adenopathy. Does not bruise/bleed easily.  Psychiatric/Behavioral: Negative for confusion.    MEDICAL HISTORY:  Past Medical History:  Diagnosis Date  . Anemia   . Cancer [facility]) 2008   colon  . Chronic kidney disease   . Diabetes mellitus without complication ([place])   . Heart murmur    asymptomatic  . Hypertension     SURGICAL HISTORY: Past Surgical History:  Procedure Laterality Date  . AV FISTULA PLACEMENT Left 01/08/2019   Procedure: INSERTION OF ARTERIOVENOUS (AV) GORE-TEX GRAFT ARM ( BRACHIAL AXILLARY );  Surgeon: Katha Cabal, MD;  Location: [facility] ORS;  Service: Vascular;  Laterality: Left;  . COLON SURGERY    . EYE SURGERY     bilateral  . RENAL ANGIOGRAPHY Bilateral 11/19/2016   Procedure: Renal Angiography;  Surgeon: Katha Cabal, MD;  Location: [place] [facility];  Service: Cardiovascular;  Laterality: Bilateral;  . RENAL ANGIOGRAPHY Right 07/03/2018   Procedure: RENAL ANGIOGRAPHY;  Surgeon: Katha Cabal, MD;  Location: [place] [facility];  Service: Cardiovascular;  Laterality: Right;  . REVERSE SHOULDER ARTHROPLASTY Left 09/19/2016   Procedure: REVERSE SHOULDER ARTHROPLASTY;  Surgeon: Corky Mull, MD;  Location: [facility] ORS;  Service: Orthopedics;  Laterality: Left;  . ROTATOR CUFF REPAIR Right     SOCIAL HISTORY: Social History   Socioeconomic History  . Marital status: Widowed  Spouse name: Not on file  . Number of children: Not on file  . Years of education: Not on file  . Highest education level: Not on file  Occupational History  . Not on file  Social Needs  .  Financial resource strain: Not on file  . Food insecurity    Worry: Not on file    Inability: Not on file  . Transportation needs    Medical: Not on file    Non-medical: Not on file  Tobacco Use  . Smoking status: Former Smoker    Years: 25.00    Types: Pipe, Cigarettes, Cigars    Quit date: 09/05/1976    Years since quitting: 42.6  . Smokeless tobacco: Never Used  Substance and Sexual Activity  . Alcohol use: Yes    Comment: ocassionally  . Drug use: No  . Sexual activity: Never  Lifestyle  . Physical activity    Days per week: Not on file    Minutes per session: Not on file  . Stress: Not on file  Relationships  . Social Herbalist on phone: Not on file    Gets together: Not on file    Attends religious service: Not on file    Active member of club or organization: Not on file    Attends meetings of clubs or organizations: Not on file    Relationship status: Not on file  . Intimate partner violence    Fear of current or ex partner: Not on file    Emotionally abused: Not on file    Physically abused: Not on file    Forced sexual activity: Not on file  Other Topics Concern  . Not on file  Social History Narrative  . Not on file    FAMILY HISTORY: Family History  Problem Relation Age of Onset  . Cancer Father     ALLERGIES:  is allergic to ace inhibitors.  MEDICATIONS:  Current Outpatient Medications  Medication Sig Dispense Refill  . allopurinol (ZYLOPRIM) 100 MG tablet Take 100 mg by mouth at bedtime.     Marland Kitchen amLODipine (NORVASC) 10 MG tablet Take 10 mg by mouth every morning.     Marland Kitchen aspirin EC 81 MG tablet Take 81 mg by mouth at bedtime.     . Blood Glucose Monitoring Suppl (ONE TOUCH ULTRA 2) w/Device KIT USE TO CHECK SUGARS TWICE DAILY    . carvedilol (COREG) 3.125 MG tablet Take 1 tablet (3.125 mg total) by mouth 2 (two) times daily with a meal. 180 tablet 3  . cloNIDine (CATAPRES) 0.1 MG tablet Take 0.1 mg by mouth 2 (two) times daily.     .  clopidogrel (PLAVIX) 75 MG tablet Take 1 tablet (75 mg total) by mouth daily. 90 tablet 4  . LANTUS SOLOSTAR 100 UNIT/ML Solostar Pen Inject 10 Units into the skin every morning.     . psyllium (METAMUCIL) 58.6 % powder Take 1 packet by mouth daily.    . simvastatin (ZOCOR) 20 MG tablet Take 20 mg by mouth at bedtime.    Marland Kitchen telmisartan (MICARDIS) 80 MG tablet Take 80 mg by mouth every morning.     . vitamin B-12 (CYANOCOBALAMIN) 1000 MCG tablet Take 1 tablet (1,000 mcg total) by mouth daily. 90 tablet 0  . ferrous sulfate 325 (65 FE) MG EC tablet Take 1 tablet (325 mg total) by mouth 2 (two) times daily. 90 tablet 1  . vitamin C (ASCORBIC ACID) 500 MG tablet Take  1 tablet (500 mg total) by mouth daily. 90 tablet 0   No current facility-administered medications for this visit.      PHYSICAL EXAMINATION: ECOG PERFORMANCE STATUS: 1 - Symptomatic but completely ambulatory Vitals:   04/16/19 1357  BP: (!) 168/69  Pulse: (!) 57  Resp: 16  Temp: (!) 97 F (36.1 C)  SpO2: 100%   Filed Weights   04/16/19 1357  Weight: 148 lb 9.6 oz (67.4 kg)    Physical Exam Constitutional:      General: He is not in acute distress.    Comments: Walks independently  HENT:     Head: Normocephalic and atraumatic.  Eyes:     General: No scleral icterus.    Pupils: Pupils are equal, round, and reactive to light.  Neck:     Musculoskeletal: Normal range of motion and neck supple.  Cardiovascular:     Rate and Rhythm: Normal rate and regular rhythm.     Heart sounds: Normal heart sounds.  Pulmonary:     Effort: Pulmonary effort is normal. No respiratory distress.     Breath sounds: No wheezing.  Abdominal:     General: Bowel sounds are normal. There is no distension.     Palpations: Abdomen is soft. There is no mass.     Tenderness: There is no abdominal tenderness.  Musculoskeletal: Normal range of motion.        General: No deformity.  Skin:    General: Skin is warm and dry.     Findings: No  erythema or rash.  Neurological:     Mental Status: He is alert and oriented to person, place, and time.     Cranial Nerves: No cranial nerve deficit.     Coordination: Coordination normal.  Psychiatric:        Behavior: Behavior normal.        Thought Content: Thought content normal.     LABORATORY DATA:  I have reviewed the data as listed Lab Results  Component Value Date   WBC 3.6 (L) 04/14/2019   HGB 8.7 (L) 04/14/2019   HCT 27.0 (L) 04/14/2019   MCV 100.7 (H) 04/14/2019   PLT 134 (L) 04/14/2019   Recent Labs    07/03/18 0921 12/25/18 1155 01/08/19 0629  NA 142 142 140  K 3.7 5.2* 4.1  CL 111 112*  --   CO2 23 24  --   GLUCOSE 46* 97 128*  BUN 39* 55*  --   CREATININE 3.31* 3.99*  --   CALCIUM 8.7* 8.9  --   GFRNONAA 16* 13*  --   GFRAA 19* 15*  --    Iron/TIBC/Ferritin/ %Sat    Component Value Date/Time   IRON 73 04/14/2019 1438   TIBC 240 (L) 04/14/2019 1438   FERRITIN 62 04/14/2019 1438   IRONPCTSAT 30 04/14/2019 1438      RADIOGRAPHIC STUDIES: I have personally reviewed the radiological images as listed and agreed with the findings in the report.  Dg Bone Survey Met  Result Date: 03/20/2019 CLINICAL DATA:  MGUS EXAM: METASTATIC BONE SURVEY COMPARISON:  None. FINDINGS: Mild eventration of the right hemidiaphragm. Lungs are clear. No pleural effusion or pneumothorax. The heart is normal in size.  Thoracic aortic atherosclerosis. Visualized bowel is unremarkable. Right inguinal hernia mesh repair. Vascular stent overlying the mid abdomen. Vascular calcifications. No lytic lesions in the calvarium. Mild degenerative changes of the mid cervical spine. Left shoulder arthroplasty. No lytic lesions in the bilateral upper extremities. Mild  degenerative changes of the visualized thoracolumbar spine. Visualized bony pelvis appears intact. No lytic lesions in the visualized bilateral lower extremities. IMPRESSION: No lytic lesions in the visualized axial or  appendicular skeleton. Additional ancillary findings as above. Electronically Signed   By: Julian Hy M.D.   On: 03/20/2019 17:23      ASSESSMENT & PLAN:  1. Anemia in stage 5 chronic kidney disease, not on chronic dialysis ([place])   2. MGUS (monoclonal gammopathy of unknown significance)   3. History of colon cancer    #Labs are reviewed and discussed with patient. -IgA MGUS Repeat multiple myeloma panel next visit.  #Anemia in chronic kidney disease. Labs are reviewed and discussed with patient. Iron panel is consistent with anemia secondary to chronic kidney disease.  I recommend IV iron to further increase his iron stores. Patient has tried oral iron supplementation.  Discussed with patient that physiological absorption of oral iron supplementation rarely reaches high iron level appropriate for chronic kidney disease. Given that his anemia continue to worse, strongly recommend he consider about IV iron. Patient is not interested at this point.  Recommend patient to continue oral iron supplementation.  Add vitamin C5 100 mg daily.  # Low normal B12 level/macrocytosis Continue vitamin B12 supplementation.  B12 level has improved.  Remote history of colon cancer.  CEA is normal. # CKD Stage 5, patient has about to start dialysis in the next few months.  Continue follow-up with nephrology. Patient follow-up in 3 months to discuss lab results and management plan.  Orders Placed This Encounter  Procedures  . CBC with Differential    Standing Status:   Future    Standing Expiration Date:   04/15/2020  . Ferritin    Standing Status:   Future    Standing Expiration Date:   04/15/2020  . Iron and TIBC    Standing Status:   Future    Standing Expiration Date:   04/15/2020    All questions were answered. The patient knows to call the clinic with any problems questions or concerns.  cc [name], MD    Return of visit: 3 months    [name], MD, PhD Hematology  Oncology [facility] Pager- [phone] 04/18/2019

## 2019-05-05 DIAGNOSIS — Z79899 Other long term (current) drug therapy: Secondary | ICD-10-CM | POA: Diagnosis not present

## 2019-05-05 DIAGNOSIS — Z794 Long term (current) use of insulin: Secondary | ICD-10-CM | POA: Diagnosis not present

## 2019-05-05 DIAGNOSIS — N184 Chronic kidney disease, stage 4 (severe): Secondary | ICD-10-CM | POA: Diagnosis not present

## 2019-05-05 DIAGNOSIS — E1122 Type 2 diabetes mellitus with diabetic chronic kidney disease: Secondary | ICD-10-CM | POA: Diagnosis not present

## 2019-05-05 DIAGNOSIS — E7800 Pure hypercholesterolemia, unspecified: Secondary | ICD-10-CM | POA: Diagnosis not present

## 2019-05-11 ENCOUNTER — Ambulatory Visit (INDEPENDENT_AMBULATORY_CARE_PROVIDER_SITE_OTHER): Payer: PPO

## 2019-05-11 ENCOUNTER — Other Ambulatory Visit: Payer: Self-pay

## 2019-05-11 DIAGNOSIS — I359 Nonrheumatic aortic valve disorder, unspecified: Secondary | ICD-10-CM

## 2019-05-12 DIAGNOSIS — N184 Chronic kidney disease, stage 4 (severe): Secondary | ICD-10-CM | POA: Diagnosis not present

## 2019-05-12 DIAGNOSIS — E1159 Type 2 diabetes mellitus with other circulatory complications: Secondary | ICD-10-CM | POA: Insufficient documentation

## 2019-05-12 DIAGNOSIS — E1142 Type 2 diabetes mellitus with diabetic polyneuropathy: Secondary | ICD-10-CM | POA: Diagnosis not present

## 2019-05-12 DIAGNOSIS — E1122 Type 2 diabetes mellitus with diabetic chronic kidney disease: Secondary | ICD-10-CM | POA: Diagnosis not present

## 2019-05-12 DIAGNOSIS — I10 Essential (primary) hypertension: Secondary | ICD-10-CM | POA: Diagnosis not present

## 2019-05-12 DIAGNOSIS — Z794 Long term (current) use of insulin: Secondary | ICD-10-CM | POA: Diagnosis not present

## 2019-05-17 DIAGNOSIS — D631 Anemia in chronic kidney disease: Secondary | ICD-10-CM | POA: Diagnosis not present

## 2019-05-17 DIAGNOSIS — R809 Proteinuria, unspecified: Secondary | ICD-10-CM | POA: Diagnosis not present

## 2019-05-17 DIAGNOSIS — I129 Hypertensive chronic kidney disease with stage 1 through stage 4 chronic kidney disease, or unspecified chronic kidney disease: Secondary | ICD-10-CM | POA: Diagnosis not present

## 2019-05-17 DIAGNOSIS — N2581 Secondary hyperparathyroidism of renal origin: Secondary | ICD-10-CM | POA: Diagnosis not present

## 2019-05-17 DIAGNOSIS — E1122 Type 2 diabetes mellitus with diabetic chronic kidney disease: Secondary | ICD-10-CM | POA: Diagnosis not present

## 2019-05-17 DIAGNOSIS — N185 Chronic kidney disease, stage 5: Secondary | ICD-10-CM | POA: Diagnosis not present

## 2019-05-18 ENCOUNTER — Encounter: Payer: Self-pay | Admitting: *Deleted

## 2019-05-18 ENCOUNTER — Telehealth: Payer: Self-pay | Admitting: *Deleted

## 2019-05-18 NOTE — Telephone Encounter (Signed)
Yes. Please refill iron. Thanks.

## 2019-05-18 NOTE — Telephone Encounter (Signed)
Patient woulfd like to know if he should continue Ferrous sulfate 325. His vial states no refills.

## 2019-05-18 NOTE — Telephone Encounter (Signed)
A refill was sent on 04/16/19 for #90 with 1 refill.

## 2019-05-19 DIAGNOSIS — E875 Hyperkalemia: Secondary | ICD-10-CM | POA: Diagnosis not present

## 2019-05-19 DIAGNOSIS — D631 Anemia in chronic kidney disease: Secondary | ICD-10-CM | POA: Diagnosis not present

## 2019-05-19 DIAGNOSIS — I129 Hypertensive chronic kidney disease with stage 1 through stage 4 chronic kidney disease, or unspecified chronic kidney disease: Secondary | ICD-10-CM | POA: Diagnosis not present

## 2019-05-19 DIAGNOSIS — N185 Chronic kidney disease, stage 5: Secondary | ICD-10-CM | POA: Diagnosis not present

## 2019-05-19 DIAGNOSIS — R319 Hematuria, unspecified: Secondary | ICD-10-CM | POA: Diagnosis not present

## 2019-05-19 DIAGNOSIS — E1122 Type 2 diabetes mellitus with diabetic chronic kidney disease: Secondary | ICD-10-CM | POA: Diagnosis not present

## 2019-05-19 DIAGNOSIS — N2581 Secondary hyperparathyroidism of renal origin: Secondary | ICD-10-CM | POA: Diagnosis not present

## 2019-05-19 DIAGNOSIS — R809 Proteinuria, unspecified: Secondary | ICD-10-CM | POA: Diagnosis not present

## 2019-06-15 DIAGNOSIS — R809 Proteinuria, unspecified: Secondary | ICD-10-CM | POA: Diagnosis not present

## 2019-06-15 DIAGNOSIS — I129 Hypertensive chronic kidney disease with stage 1 through stage 4 chronic kidney disease, or unspecified chronic kidney disease: Secondary | ICD-10-CM | POA: Diagnosis not present

## 2019-06-15 DIAGNOSIS — R319 Hematuria, unspecified: Secondary | ICD-10-CM | POA: Diagnosis not present

## 2019-06-15 DIAGNOSIS — N185 Chronic kidney disease, stage 5: Secondary | ICD-10-CM | POA: Diagnosis not present

## 2019-06-15 DIAGNOSIS — D631 Anemia in chronic kidney disease: Secondary | ICD-10-CM | POA: Diagnosis not present

## 2019-06-15 DIAGNOSIS — N2581 Secondary hyperparathyroidism of renal origin: Secondary | ICD-10-CM | POA: Diagnosis not present

## 2019-06-15 DIAGNOSIS — E1122 Type 2 diabetes mellitus with diabetic chronic kidney disease: Secondary | ICD-10-CM | POA: Diagnosis not present

## 2019-06-15 DIAGNOSIS — E875 Hyperkalemia: Secondary | ICD-10-CM | POA: Diagnosis not present

## 2019-06-21 DIAGNOSIS — I129 Hypertensive chronic kidney disease with stage 1 through stage 4 chronic kidney disease, or unspecified chronic kidney disease: Secondary | ICD-10-CM | POA: Diagnosis not present

## 2019-06-21 DIAGNOSIS — D631 Anemia in chronic kidney disease: Secondary | ICD-10-CM | POA: Diagnosis not present

## 2019-06-21 DIAGNOSIS — E875 Hyperkalemia: Secondary | ICD-10-CM | POA: Diagnosis not present

## 2019-06-21 DIAGNOSIS — E1122 Type 2 diabetes mellitus with diabetic chronic kidney disease: Secondary | ICD-10-CM | POA: Diagnosis not present

## 2019-06-21 DIAGNOSIS — N2581 Secondary hyperparathyroidism of renal origin: Secondary | ICD-10-CM | POA: Diagnosis not present

## 2019-06-21 DIAGNOSIS — N185 Chronic kidney disease, stage 5: Secondary | ICD-10-CM | POA: Diagnosis not present

## 2019-06-21 DIAGNOSIS — R319 Hematuria, unspecified: Secondary | ICD-10-CM | POA: Diagnosis not present

## 2019-06-21 DIAGNOSIS — I701 Atherosclerosis of renal artery: Secondary | ICD-10-CM | POA: Diagnosis not present

## 2019-06-21 DIAGNOSIS — R809 Proteinuria, unspecified: Secondary | ICD-10-CM | POA: Diagnosis not present

## 2019-06-29 DIAGNOSIS — E7800 Pure hypercholesterolemia, unspecified: Secondary | ICD-10-CM | POA: Diagnosis not present

## 2019-06-29 DIAGNOSIS — Z79899 Other long term (current) drug therapy: Secondary | ICD-10-CM | POA: Diagnosis not present

## 2019-06-29 DIAGNOSIS — N185 Chronic kidney disease, stage 5: Secondary | ICD-10-CM | POA: Diagnosis not present

## 2019-06-29 DIAGNOSIS — E1159 Type 2 diabetes mellitus with other circulatory complications: Secondary | ICD-10-CM | POA: Diagnosis not present

## 2019-06-29 DIAGNOSIS — I10 Essential (primary) hypertension: Secondary | ICD-10-CM | POA: Diagnosis not present

## 2019-06-29 DIAGNOSIS — Z0000 Encounter for general adult medical examination without abnormal findings: Secondary | ICD-10-CM | POA: Diagnosis not present

## 2019-07-12 ENCOUNTER — Other Ambulatory Visit: Payer: Self-pay

## 2019-07-12 ENCOUNTER — Inpatient Hospital Stay: Payer: PPO | Attending: Oncology

## 2019-07-12 DIAGNOSIS — D631 Anemia in chronic kidney disease: Secondary | ICD-10-CM | POA: Diagnosis not present

## 2019-07-12 DIAGNOSIS — E611 Iron deficiency: Secondary | ICD-10-CM | POA: Insufficient documentation

## 2019-07-12 DIAGNOSIS — D472 Monoclonal gammopathy: Secondary | ICD-10-CM

## 2019-07-12 DIAGNOSIS — N185 Chronic kidney disease, stage 5: Secondary | ICD-10-CM | POA: Insufficient documentation

## 2019-07-12 DIAGNOSIS — Z85038 Personal history of other malignant neoplasm of large intestine: Secondary | ICD-10-CM

## 2019-07-12 LAB — CBC WITH DIFFERENTIAL/PLATELET
Abs Immature Granulocytes: 0.01 10*3/uL (ref 0.00–0.07)
Basophils Absolute: 0 10*3/uL (ref 0.0–0.1)
Basophils Relative: 1 %
Eosinophils Absolute: 0.2 10*3/uL (ref 0.0–0.5)
Eosinophils Relative: 4 %
HCT: 23.6 % — ABNORMAL LOW (ref 39.0–52.0)
Hemoglobin: 7.8 g/dL — ABNORMAL LOW (ref 13.0–17.0)
Immature Granulocytes: 0 %
Lymphocytes Relative: 20 %
Lymphs Abs: 1 10*3/uL (ref 0.7–4.0)
MCH: 32.9 pg (ref 26.0–34.0)
MCHC: 33.1 g/dL (ref 30.0–36.0)
MCV: 99.6 fL (ref 80.0–100.0)
Monocytes Absolute: 0.5 10*3/uL (ref 0.1–1.0)
Monocytes Relative: 10 %
Neutro Abs: 3.1 10*3/uL (ref 1.7–7.7)
Neutrophils Relative %: 65 %
Platelets: 138 10*3/uL — ABNORMAL LOW (ref 150–400)
RBC: 2.37 MIL/uL — ABNORMAL LOW (ref 4.22–5.81)
RDW: 14.3 % (ref 11.5–15.5)
WBC: 4.8 10*3/uL (ref 4.0–10.5)
nRBC: 0 % (ref 0.0–0.2)

## 2019-07-12 LAB — IRON AND TIBC
Iron: 60 ug/dL (ref 45–182)
Saturation Ratios: 25 % (ref 17.9–39.5)
TIBC: 244 ug/dL — ABNORMAL LOW (ref 250–450)
UIBC: 184 ug/dL

## 2019-07-12 LAB — FERRITIN: Ferritin: 100 ng/mL (ref 24–336)

## 2019-07-13 ENCOUNTER — Other Ambulatory Visit: Payer: Self-pay

## 2019-07-13 ENCOUNTER — Inpatient Hospital Stay: Payer: PPO | Admitting: Oncology

## 2019-07-13 ENCOUNTER — Encounter: Payer: Self-pay | Admitting: Oncology

## 2019-07-13 ENCOUNTER — Other Ambulatory Visit: Payer: PPO

## 2019-07-13 VITALS — BP 177/59 | HR 61 | Temp 96.0°F | Wt 147.7 lb

## 2019-07-13 DIAGNOSIS — I701 Atherosclerosis of renal artery: Secondary | ICD-10-CM | POA: Diagnosis not present

## 2019-07-13 DIAGNOSIS — D631 Anemia in chronic kidney disease: Secondary | ICD-10-CM

## 2019-07-13 DIAGNOSIS — Z85038 Personal history of other malignant neoplasm of large intestine: Secondary | ICD-10-CM | POA: Diagnosis not present

## 2019-07-13 DIAGNOSIS — E1122 Type 2 diabetes mellitus with diabetic chronic kidney disease: Secondary | ICD-10-CM | POA: Diagnosis not present

## 2019-07-13 DIAGNOSIS — I129 Hypertensive chronic kidney disease with stage 1 through stage 4 chronic kidney disease, or unspecified chronic kidney disease: Secondary | ICD-10-CM | POA: Diagnosis not present

## 2019-07-13 DIAGNOSIS — E875 Hyperkalemia: Secondary | ICD-10-CM | POA: Diagnosis not present

## 2019-07-13 DIAGNOSIS — R319 Hematuria, unspecified: Secondary | ICD-10-CM | POA: Diagnosis not present

## 2019-07-13 DIAGNOSIS — N185 Chronic kidney disease, stage 5: Secondary | ICD-10-CM

## 2019-07-13 DIAGNOSIS — N2581 Secondary hyperparathyroidism of renal origin: Secondary | ICD-10-CM | POA: Diagnosis not present

## 2019-07-13 DIAGNOSIS — R809 Proteinuria, unspecified: Secondary | ICD-10-CM | POA: Diagnosis not present

## 2019-07-13 DIAGNOSIS — D472 Monoclonal gammopathy: Secondary | ICD-10-CM

## 2019-07-13 LAB — KAPPA/LAMBDA LIGHT CHAINS
Kappa free light chain: 142 mg/L — ABNORMAL HIGH (ref 3.3–19.4)
Kappa, lambda light chain ratio: 5.61 — ABNORMAL HIGH (ref 0.26–1.65)
Lambda free light chains: 25.3 mg/L (ref 5.7–26.3)

## 2019-07-13 NOTE — Progress Notes (Signed)
Patient does not offer any problems today.  

## 2019-07-13 NOTE — Progress Notes (Signed)
Hematology/Oncology follow up note [facility] [phone] Fax:(336) [phone]   Patient Care Team: [name], MD as PCP - General (Internal Medicine)  REFERRING PROVIDER: [name], MD  CHIEF COMPLAINTS/REASON FOR VISIT:  Follow-up for anemia in chronic kidney disease.  HISTORY OF PRESENTING ILLNESS:   Dylan Sanders is a  86 y.o.  male with PMH listed below was seen in consultation at the request of  [name], MD  for evaluation of anemia and chronic kidney disease. Patient has chronic kidney disease, secondary to longstanding high blood pressure.  Follows up with nephrology. He has had AV graft placed and is matured.  Last seen by nephrology on 02/23/2019, no indication to start dialysis yet. 02/11/2019, hemoglobin 8.5, hematocrit 26.3, MCV 102.3, Creatinine 3.86, EGFR 15. Negative hepatitis panel Patient was referred to heme-onc for evaluation of anemia in the setting of chronic kidney disease. Patient reports feeling well.  Chronic fatigue at baseline. Appetite is fair. Denies weight loss, fever, chills, fatigue, night sweats.  #Reported remote history of colon cancer 2008.  Status post resection. #Diarrhea, chronic.  No blood in the stool.  INTERVAL HISTORY Dylan Sanders is a 86 y.o. male who has above history reviewed by me today presents for follow up visit for management of anemia and chronic disease. Problems and complaints are listed below: Patient reports feeling well. Denies any shortness of breath or fatigue more than his usual baseline. No new complaints. He sees nephrologist monthly.  CKD stage V, close to start dialysis .  Review of Systems  Constitutional: Positive for fatigue. Negative for appetite change, chills, fever and unexpected weight change.  HENT:   Negative for hearing loss and voice change.   Eyes: Negative for eye problems and icterus.  Respiratory: Negative for chest tightness, cough  and shortness of breath.   Cardiovascular: Negative for chest pain and leg swelling.  Gastrointestinal: Negative for abdominal distention, abdominal pain and diarrhea.  Endocrine: Negative for hot flashes.  Genitourinary: Negative for difficulty urinating, dysuria and frequency.   Musculoskeletal: Negative for arthralgias.  Skin: Negative for itching and rash.  Neurological: Negative for light-headedness and numbness.  Hematological: Negative for adenopathy. Does not bruise/bleed easily.  Psychiatric/Behavioral: Negative for confusion.    MEDICAL HISTORY:  Past Medical History:  Diagnosis Date  . Anemia   . Cancer [facility]) 2008   colon  . Chronic kidney disease   . Diabetes mellitus without complication ([place])   . Heart murmur    asymptomatic  . Hypertension     SURGICAL HISTORY: Past Surgical History:  Procedure Laterality Date  . AV FISTULA PLACEMENT Left 01/08/2019   Procedure: INSERTION OF ARTERIOVENOUS (AV) GORE-TEX GRAFT ARM ( BRACHIAL AXILLARY );  Surgeon: Katha Cabal, MD;  Location: [facility] ORS;  Service: Vascular;  Laterality: Left;  . COLON SURGERY    . EYE SURGERY     bilateral  . RENAL ANGIOGRAPHY Bilateral 11/19/2016   Procedure: Renal Angiography;  Surgeon: Katha Cabal, MD;  Location: [place] [facility];  Service: Cardiovascular;  Laterality: Bilateral;  . RENAL ANGIOGRAPHY Right 07/03/2018   Procedure: RENAL ANGIOGRAPHY;  Surgeon: Katha Cabal, MD;  Location: [place] [facility];  Service: Cardiovascular;  Laterality: Right;  . REVERSE SHOULDER ARTHROPLASTY Left 09/19/2016   Procedure: REVERSE SHOULDER ARTHROPLASTY;  Surgeon: Corky Mull, MD;  Location: [facility] ORS;  Service: Orthopedics;  Laterality: Left;  . ROTATOR CUFF REPAIR Right     SOCIAL HISTORY:  Social History   Socioeconomic History  . Marital status: Widowed    Spouse name: Not on file  . Number of children: Not on file  . Years of education: Not on file  . Highest education  level: Not on file  Occupational History  . Not on file  Tobacco Use  . Smoking status: Former Smoker    Years: 25.00    Types: Pipe, Cigarettes, Cigars    Quit date: 09/05/1976    Years since quitting: 42.8  . Smokeless tobacco: Never Used  Substance and Sexual Activity  . Alcohol use: Yes    Comment: ocassionally  . Drug use: No  . Sexual activity: Never  Other Topics Concern  . Not on file  Social History Narrative  . Not on file   Social Determinants of Health   Financial Resource Strain:   . Difficulty of Paying Living Expenses: Not on file  Food Insecurity:   . Worried About Charity fundraiser in the Last Year: Not on file  . Ran Out of Food in the Last Year: Not on file  Transportation Needs:   . Lack of Transportation (Medical): Not on file  . Lack of Transportation (Non-Medical): Not on file  Physical Activity:   . Days of Exercise per Week: Not on file  . Minutes of Exercise per Session: Not on file  Stress:   . Feeling of Stress : Not on file  Social Connections:   . Frequency of Communication with Friends and Family: Not on file  . Frequency of Social Gatherings with Friends and Family: Not on file  . Attends Religious Services: Not on file  . Active Member of Clubs or Organizations: Not on file  . Attends Archivist Meetings: Not on file  . Marital Status: Not on file  Intimate Partner Violence:   . Fear of Current or Ex-Partner: Not on file  . Emotionally Abused: Not on file  . Physically Abused: Not on file  . Sexually Abused: Not on file    FAMILY HISTORY: Family History  Problem Relation Age of Onset  . Cancer Father     ALLERGIES:  is allergic to ace inhibitors.  MEDICATIONS:  Current Outpatient Medications  Medication Sig Dispense Refill  . allopurinol (ZYLOPRIM) 100 MG tablet Take 100 mg by mouth at bedtime.     Marland Kitchen amLODipine (NORVASC) 10 MG tablet Take 10 mg by mouth every morning.     Marland Kitchen aspirin EC 81 MG tablet Take 81 mg by  mouth at bedtime.     . Blood Glucose Monitoring Suppl (ONE TOUCH ULTRA 2) w/Device KIT USE TO CHECK SUGARS TWICE DAILY    . carvedilol (COREG) 3.125 MG tablet Take 1 tablet (3.125 mg total) by mouth 2 (two) times daily with a meal. 180 tablet 3  . cloNIDine (CATAPRES) 0.1 MG tablet Take 0.1 mg by mouth 2 (two) times daily.     . clopidogrel (PLAVIX) 75 MG tablet Take 1 tablet (75 mg total) by mouth daily. 90 tablet 4  . ferrous sulfate 325 (65 FE) MG EC tablet Take 1 tablet (325 mg total) by mouth 2 (two) times daily. 90 tablet 1  . LANTUS SOLOSTAR 100 UNIT/ML Solostar Pen Inject 10 Units into the skin every morning.     . psyllium (METAMUCIL) 58.6 % powder Take 1 packet by mouth daily.    . simvastatin (ZOCOR) 20 MG tablet Take 20 mg by mouth at bedtime.    Marland Kitchen telmisartan (  MICARDIS) 80 MG tablet Take 80 mg by mouth every morning.     . vitamin B-12 (CYANOCOBALAMIN) 1000 MCG tablet Take 1 tablet (1,000 mcg total) by mouth daily. 90 tablet 0  . vitamin C (ASCORBIC ACID) 500 MG tablet Take 1 tablet (500 mg total) by mouth daily. 90 tablet 0   No current facility-administered medications for this visit.     PHYSICAL EXAMINATION: ECOG PERFORMANCE STATUS: 1 - Symptomatic but completely ambulatory Vitals:   07/13/19 0900  BP: (!) 177/59  Pulse: 61  Temp: (!) 96 F (35.6 C)   Filed Weights   07/13/19 0900  Weight: 147 lb 11.2 oz (67 kg)    Physical Exam Constitutional:      General: He is not in acute distress. HENT:     Head: Normocephalic and atraumatic.  Eyes:     General: No scleral icterus. Cardiovascular:     Rate and Rhythm: Normal rate and regular rhythm.     Heart sounds: Murmur present.  Pulmonary:     Effort: Pulmonary effort is normal. No respiratory distress.     Breath sounds: No wheezing.  Abdominal:     General: Bowel sounds are normal. There is no distension.     Palpations: Abdomen is soft.  Musculoskeletal:        General: No deformity. Normal range of  motion.     Cervical back: Normal range of motion and neck supple.  Skin:    General: Skin is warm and dry.     Findings: No erythema or rash.  Neurological:     Mental Status: He is alert and oriented to person, place, and time. Mental status is at baseline.     Cranial Nerves: No cranial nerve deficit.     Coordination: Coordination normal.  Psychiatric:        Mood and Affect: Mood normal.     LABORATORY DATA:  I have reviewed the data as listed Lab Results  Component Value Date   WBC 4.8 07/12/2019   HGB 7.8 (L) 07/12/2019   HCT 23.6 (L) 07/12/2019   MCV 99.6 07/12/2019   PLT 138 (L) 07/12/2019   Recent Labs    12/25/18 1155 01/08/19 0629  NA 142 140  K 5.2* 4.1  CL 112*  --   CO2 24  --   GLUCOSE 97 128*  BUN 55*  --   CREATININE 3.99*  --   CALCIUM 8.9  --   GFRNONAA 13*  --   GFRAA 15*  --    Iron/TIBC/Ferritin/ %Sat    Component Value Date/Time   IRON 60 07/12/2019 1315   TIBC 244 (L) 07/12/2019 1315   FERRITIN 100 07/12/2019 1315   IRONPCTSAT 25 07/12/2019 1315      RADIOGRAPHIC STUDIES: I have personally reviewed the radiological images as listed and agreed with the findings in the report.  No results found.    ASSESSMENT & PLAN:  1. Anemia in stage 5 chronic kidney disease, not on chronic dialysis ([place])   2. CKD (chronic kidney disease) stage 5, GFR less than 15 ml/min ([facility])   3. History of colon cancer   4. MGUS (monoclonal gammopathy of unknown significance)    IgA MGUS Multiple myeloma panel is pending.  #Anemia in chronic kidney disease. Labs are reviewed and discussed with patient. Anemia has worsened since last visit.  Hemoglobin has now decreased to 7.8 Patient was previously offered IV Venofer to further increase his iron store, preparing for erythropoietin replacement  therapy.  Patient previously declined IV iron and prefers to try oral iron supplementation with vitamin C. Iron panel was reviewed.  Ferritin 100, iron saturation  25.  Consistent with anemia secondary to chronic disease. Today he is agreeable with starting IV iron treatments. Plan IV iron with Venofer 223m weekly x 4 doses. Allergy reactions/infusion reaction including anaphylactic reaction discussed with patient. Other side effects include but not limited to high blood pressure, skin rash, weight gain, leg swelling, etc. Patient voices understanding and willing to proceed.  We also discussed about the rationale of starting erythropoietin replacement therapy.  Recommend Retacrit, started at 20,000 units.  Discussed about the potential side effects including but not limited to stroke, heart attack, thrombosis, increase of blood pressure.  He agrees with the plan.  We will proceed with first dose of Retacrit after he receives 1 dose of Venofer.  CKD stage V, recommend patient continue follow-up with nephrologist for recommendation for dialysis.  Remote history of colon cancer.  CEA was normal on 03/10/2019. Patient follow-up in 3 months to discuss lab results and management plan.  Orders Placed This Encounter  Procedures  . CBC with Differential/Platelet    Standing Status:   Future    Standing Expiration Date:   07/12/2020  . Ferritin    Standing Status:   Future    Standing Expiration Date:   07/12/2020  . Iron and TIBC    Standing Status:   Future    Standing Expiration Date:   07/12/2020    All questions were answered. The patient knows to call the clinic with any problems questions or concerns.  cc SIdelle Crouch MD    Return of visit: 3 months    ZEarlie Server MD, PhD Hematology Oncology [facility] Hospitalat AColumb[facility] Surgery Center IncPager- [phone]/06/2019

## 2019-07-14 ENCOUNTER — Ambulatory Visit: Payer: PPO | Admitting: Oncology

## 2019-07-15 ENCOUNTER — Other Ambulatory Visit: Payer: PPO

## 2019-07-15 ENCOUNTER — Ambulatory Visit: Payer: PPO | Admitting: Oncology

## 2019-07-15 LAB — MULTIPLE MYELOMA PANEL, SERUM
Albumin SerPl Elph-Mcnc: 3.7 g/dL (ref 2.9–4.4)
Albumin/Glob SerPl: 1.6 (ref 0.7–1.7)
Alpha 1: 0.3 g/dL (ref 0.0–0.4)
Alpha2 Glob SerPl Elph-Mcnc: 0.7 g/dL (ref 0.4–1.0)
B-Globulin SerPl Elph-Mcnc: 0.8 g/dL (ref 0.7–1.3)
Gamma Glob SerPl Elph-Mcnc: 0.6 g/dL (ref 0.4–1.8)
Globulin, Total: 2.4 g/dL (ref 2.2–3.9)
IgA: 274 mg/dL (ref 61–437)
IgG (Immunoglobin G), Serum: 567 mg/dL — ABNORMAL LOW (ref 603–1613)
IgM (Immunoglobulin M), Srm: 48 mg/dL (ref 15–143)
Total Protein ELP: 6.1 g/dL (ref 6.0–8.5)

## 2019-07-19 ENCOUNTER — Other Ambulatory Visit: Payer: Self-pay | Admitting: Nephrology

## 2019-07-19 ENCOUNTER — Other Ambulatory Visit: Payer: Self-pay

## 2019-07-19 ENCOUNTER — Ambulatory Visit
Admission: RE | Admit: 2019-07-19 | Discharge: 2019-07-19 | Disposition: A | Discharge status: Home / Self Care | Payer: PPO | Source: Ambulatory Visit | Attending: Nephrology | Admitting: Nephrology

## 2019-07-19 DIAGNOSIS — Z111 Encounter for screening for respiratory tuberculosis: Secondary | ICD-10-CM

## 2019-07-19 DIAGNOSIS — I129 Hypertensive chronic kidney disease with stage 1 through stage 4 chronic kidney disease, or unspecified chronic kidney disease: Secondary | ICD-10-CM | POA: Diagnosis not present

## 2019-07-19 DIAGNOSIS — N185 Chronic kidney disease, stage 5: Secondary | ICD-10-CM | POA: Diagnosis not present

## 2019-07-19 DIAGNOSIS — N2581 Secondary hyperparathyroidism of renal origin: Secondary | ICD-10-CM | POA: Diagnosis not present

## 2019-07-19 DIAGNOSIS — R7611 Nonspecific reaction to tuberculin skin test without active tuberculosis: Secondary | ICD-10-CM | POA: Diagnosis not present

## 2019-07-19 DIAGNOSIS — D631 Anemia in chronic kidney disease: Secondary | ICD-10-CM | POA: Diagnosis not present

## 2019-07-19 DIAGNOSIS — E875 Hyperkalemia: Secondary | ICD-10-CM | POA: Diagnosis not present

## 2019-07-19 DIAGNOSIS — E1122 Type 2 diabetes mellitus with diabetic chronic kidney disease: Secondary | ICD-10-CM | POA: Diagnosis not present

## 2019-07-19 DIAGNOSIS — R809 Proteinuria, unspecified: Secondary | ICD-10-CM | POA: Diagnosis not present

## 2019-07-19 DIAGNOSIS — I701 Atherosclerosis of renal artery: Secondary | ICD-10-CM | POA: Diagnosis not present

## 2019-07-19 DIAGNOSIS — R319 Hematuria, unspecified: Secondary | ICD-10-CM | POA: Diagnosis not present

## 2019-07-21 DIAGNOSIS — R197 Diarrhea, unspecified: Secondary | ICD-10-CM | POA: Insufficient documentation

## 2019-07-21 DIAGNOSIS — C189 Malignant neoplasm of colon, unspecified: Secondary | ICD-10-CM | POA: Insufficient documentation

## 2019-07-21 DIAGNOSIS — D689 Coagulation defect, unspecified: Secondary | ICD-10-CM | POA: Insufficient documentation

## 2019-07-21 DIAGNOSIS — N529 Male erectile dysfunction, unspecified: Secondary | ICD-10-CM | POA: Insufficient documentation

## 2019-07-21 DIAGNOSIS — Z23 Encounter for immunization: Secondary | ICD-10-CM | POA: Insufficient documentation

## 2019-07-21 DIAGNOSIS — R52 Pain, unspecified: Secondary | ICD-10-CM | POA: Insufficient documentation

## 2019-07-21 DIAGNOSIS — D509 Iron deficiency anemia, unspecified: Secondary | ICD-10-CM | POA: Insufficient documentation

## 2019-07-21 DIAGNOSIS — L299 Pruritus, unspecified: Secondary | ICD-10-CM | POA: Insufficient documentation

## 2019-07-21 DIAGNOSIS — R509 Fever, unspecified: Secondary | ICD-10-CM | POA: Insufficient documentation

## 2019-07-21 DIAGNOSIS — R0602 Shortness of breath: Secondary | ICD-10-CM | POA: Insufficient documentation

## 2019-07-21 DIAGNOSIS — E8779 Other fluid overload: Secondary | ICD-10-CM | POA: Insufficient documentation

## 2019-07-22 ENCOUNTER — Inpatient Hospital Stay: Payer: PPO

## 2019-07-22 VITALS — BP 163/58 | HR 62 | Temp 97.0°F | Resp 18

## 2019-07-22 DIAGNOSIS — N185 Chronic kidney disease, stage 5: Secondary | ICD-10-CM | POA: Diagnosis not present

## 2019-07-22 DIAGNOSIS — D631 Anemia in chronic kidney disease: Secondary | ICD-10-CM

## 2019-07-22 MED ORDER — SODIUM CHLORIDE 0.9 % IV SOLN
Freq: Once | INTRAVENOUS | Status: AC
  Filled 2019-07-22: qty 250

## 2019-07-22 MED ORDER — IRON SUCROSE 20 MG/ML IV SOLN
200.0000 mg | Freq: Once | INTRAVENOUS | Status: AC
  Administered 2019-07-22: 200 mg via INTRAVENOUS
  Filled 2019-07-22: qty 10

## 2019-07-27 ENCOUNTER — Other Ambulatory Visit: Payer: Self-pay

## 2019-07-27 ENCOUNTER — Other Ambulatory Visit (INDEPENDENT_AMBULATORY_CARE_PROVIDER_SITE_OTHER): Payer: Self-pay | Admitting: Vascular Surgery

## 2019-07-27 ENCOUNTER — Encounter
Admission: EM | Disposition: A | Discharge status: Home / Self Care | Payer: Self-pay | Source: Home / Self Care | Attending: Emergency Medicine

## 2019-07-27 ENCOUNTER — Emergency Department
Admission: EM | Admit: 2019-07-27 | Discharge: 2019-07-27 | Disposition: A | Discharge status: Home / Self Care | Payer: PPO | Attending: Emergency Medicine | Admitting: Emergency Medicine

## 2019-07-27 ENCOUNTER — Inpatient Hospital Stay: Payer: PPO

## 2019-07-27 DIAGNOSIS — E1122 Type 2 diabetes mellitus with diabetic chronic kidney disease: Secondary | ICD-10-CM | POA: Diagnosis not present

## 2019-07-27 DIAGNOSIS — Z87891 Personal history of nicotine dependence: Secondary | ICD-10-CM | POA: Insufficient documentation

## 2019-07-27 DIAGNOSIS — Z03818 Encounter for observation for suspected exposure to other biological agents ruled out: Secondary | ICD-10-CM | POA: Diagnosis not present

## 2019-07-27 DIAGNOSIS — Z79899 Other long term (current) drug therapy: Secondary | ICD-10-CM | POA: Diagnosis not present

## 2019-07-27 DIAGNOSIS — I120 Hypertensive chronic kidney disease with stage 5 chronic kidney disease or end stage renal disease: Secondary | ICD-10-CM | POA: Insufficient documentation

## 2019-07-27 DIAGNOSIS — N186 End stage renal disease: Secondary | ICD-10-CM | POA: Diagnosis not present

## 2019-07-27 DIAGNOSIS — N2581 Secondary hyperparathyroidism of renal origin: Secondary | ICD-10-CM | POA: Diagnosis not present

## 2019-07-27 DIAGNOSIS — Z20822 Contact with and (suspected) exposure to covid-19: Secondary | ICD-10-CM | POA: Diagnosis not present

## 2019-07-27 DIAGNOSIS — D631 Anemia in chronic kidney disease: Secondary | ICD-10-CM | POA: Diagnosis not present

## 2019-07-27 DIAGNOSIS — Z7982 Long term (current) use of aspirin: Secondary | ICD-10-CM | POA: Diagnosis not present

## 2019-07-27 DIAGNOSIS — T829XXA Unspecified complication of cardiac and vascular prosthetic device, implant and graft, initial encounter: Secondary | ICD-10-CM | POA: Diagnosis not present

## 2019-07-27 DIAGNOSIS — Z7902 Long term (current) use of antithrombotics/antiplatelets: Secondary | ICD-10-CM | POA: Diagnosis not present

## 2019-07-27 DIAGNOSIS — R58 Hemorrhage, not elsewhere classified: Secondary | ICD-10-CM

## 2019-07-27 DIAGNOSIS — Z992 Dependence on renal dialysis: Secondary | ICD-10-CM | POA: Diagnosis not present

## 2019-07-27 DIAGNOSIS — T82898A Other specified complication of vascular prosthetic devices, implants and grafts, initial encounter: Secondary | ICD-10-CM | POA: Diagnosis not present

## 2019-07-27 DIAGNOSIS — D509 Iron deficiency anemia, unspecified: Secondary | ICD-10-CM | POA: Diagnosis not present

## 2019-07-27 DIAGNOSIS — Z794 Long term (current) use of insulin: Secondary | ICD-10-CM | POA: Diagnosis not present

## 2019-07-27 DIAGNOSIS — T82838A Hemorrhage of vascular prosthetic devices, implants and grafts, initial encounter: Secondary | ICD-10-CM | POA: Diagnosis not present

## 2019-07-27 DIAGNOSIS — E118 Type 2 diabetes mellitus with unspecified complications: Secondary | ICD-10-CM

## 2019-07-27 DIAGNOSIS — I10 Essential (primary) hypertension: Secondary | ICD-10-CM | POA: Diagnosis not present

## 2019-07-27 HISTORY — PX: A/V SHUNT INTERVENTION: CATH118220

## 2019-07-27 LAB — GLUCOSE, CAPILLARY
Glucose-Capillary: 58 mg/dL — ABNORMAL LOW (ref 70–99)
Glucose-Capillary: 161 mg/dL — ABNORMAL HIGH (ref 70–99)

## 2019-07-27 LAB — RESPIRATORY PANEL BY RT PCR (FLU A&B, COVID)
Influenza A by PCR: NEGATIVE
Influenza B by PCR: NEGATIVE
SARS Coronavirus 2 by RT PCR: NEGATIVE

## 2019-07-27 LAB — CBC
HCT: 22.8 % — ABNORMAL LOW (ref 39.0–52.0)
Hemoglobin: 8 g/dL — ABNORMAL LOW (ref 13.0–17.0)
MCH: 33.8 pg (ref 26.0–34.0)
MCHC: 35.1 g/dL (ref 30.0–36.0)
MCV: 96.2 fL (ref 80.0–100.0)
Platelets: 162 10*3/uL (ref 150–400)
RBC: 2.37 MIL/uL — ABNORMAL LOW (ref 4.22–5.81)
RDW: 14.1 % (ref 11.5–15.5)
WBC: 4.9 10*3/uL (ref 4.0–10.5)
nRBC: 0 % (ref 0.0–0.2)

## 2019-07-27 LAB — BASIC METABOLIC PANEL
Anion gap: 11 (ref 5–15)
BUN: 36 mg/dL — ABNORMAL HIGH (ref 8–23)
CO2: 24 mmol/L (ref 22–32)
Calcium: 8.6 mg/dL — ABNORMAL LOW (ref 8.9–10.3)
Chloride: 108 mmol/L (ref 98–111)
Creatinine, Ser: 3 mg/dL — ABNORMAL HIGH (ref 0.61–1.24)
GFR calc Af Amer: 21 mL/min — ABNORMAL LOW (ref >60)
GFR calc non Af Amer: 18 mL/min — ABNORMAL LOW (ref >60)
Glucose, Bld: 74 mg/dL (ref 70–99)
Potassium: 3.6 mmol/L (ref 3.5–5.1)
Sodium: 143 mmol/L (ref 135–145)

## 2019-07-27 SURGERY — A/V SHUNT INTERVENTION
Anesthesia: Moderate Sedation | Laterality: Left

## 2019-07-27 MED ORDER — DIPHENHYDRAMINE HCL 50 MG/ML IJ SOLN: 50.0000 mg | Freq: Once | INTRAMUSCULAR | Status: DC | PRN

## 2019-07-27 MED ORDER — FAMOTIDINE 20 MG PO TABS: 40.0000 mg | ORAL_TABLET | Freq: Once | ORAL | Status: DC | PRN

## 2019-07-27 MED ORDER — HYDROMORPHONE HCL 1 MG/ML IJ SOLN: 1.0000 mg | Freq: Once | INTRAMUSCULAR | Status: DC | PRN

## 2019-07-27 MED ORDER — MIDAZOLAM HCL 2 MG/ML PO SYRP: 8.0000 mg | ORAL_SOLUTION | Freq: Once | ORAL | Status: DC | PRN

## 2019-07-27 MED ORDER — SODIUM CHLORIDE 0.9 % IV SOLN: INTRAVENOUS | Status: DC

## 2019-07-27 MED ORDER — HEPARIN SODIUM (PORCINE) 1000 UNIT/ML IJ SOLN
INTRAMUSCULAR | Status: AC
  Filled 2019-07-27: qty 1

## 2019-07-27 MED ORDER — MIDAZOLAM HCL 2 MG/2ML IJ SOLN
INTRAMUSCULAR | Status: DC | PRN
  Administered 2019-07-27 (×2): 1 mg via INTRAVENOUS

## 2019-07-27 MED ORDER — CEFAZOLIN SODIUM-DEXTROSE 1-4 GM/50ML-% IV SOLN
INTRAVENOUS | Status: AC
  Filled 2019-07-27: qty 50

## 2019-07-27 MED ORDER — CEFAZOLIN SODIUM-DEXTROSE 1-4 GM/50ML-% IV SOLN
1.0000 g | Freq: Once | INTRAVENOUS | Status: AC
  Administered 2019-07-27: 1 g via INTRAVENOUS

## 2019-07-27 MED ORDER — FENTANYL CITRATE (PF) 100 MCG/2ML IJ SOLN
INTRAMUSCULAR | Status: DC | PRN
  Administered 2019-07-27: 25 ug via INTRAVENOUS
  Administered 2019-07-27: 50 ug via INTRAVENOUS

## 2019-07-27 MED ORDER — FENTANYL CITRATE (PF) 100 MCG/2ML IJ SOLN
INTRAMUSCULAR | Status: AC
  Filled 2019-07-27: qty 2

## 2019-07-27 MED ORDER — DEXTROSE 50 % IV SOLN: 1.0000 | INTRAVENOUS | Status: AC

## 2019-07-27 MED ORDER — HEPARIN SODIUM (PORCINE) 1000 UNIT/ML IJ SOLN
INTRAMUSCULAR | Status: DC | PRN
  Administered 2019-07-27: 3000 [IU] via INTRAVENOUS

## 2019-07-27 MED ORDER — MIDAZOLAM HCL 2 MG/2ML IJ SOLN
INTRAMUSCULAR | Status: AC
  Filled 2019-07-27: qty 2

## 2019-07-27 MED ORDER — METHYLPREDNISOLONE SODIUM SUCC 125 MG IJ SOLR: 125.0000 mg | Freq: Once | INTRAMUSCULAR | Status: DC | PRN

## 2019-07-27 MED ORDER — DEXTROSE 50 % IV SOLN
INTRAVENOUS | Status: AC
  Administered 2019-07-27: 50 mL via INTRAVENOUS
  Filled 2019-07-27: qty 50

## 2019-07-27 MED ORDER — ONDANSETRON HCL 4 MG/2ML IJ SOLN: 4.0000 mg | Freq: Four times a day (QID) | INTRAMUSCULAR | Status: DC | PRN

## 2019-07-27 MED ORDER — IODIXANOL 320 MG/ML IV SOLN
INTRAVENOUS | Status: DC | PRN
  Administered 2019-07-27: 15 mL

## 2019-07-27 SURGICAL SUPPLY — 14 items
BALLN DORADO 8X40X80 (BALLOONS) ×3
BALLN ULTRVRSE 5X60X75C (BALLOONS) ×3
BALLOON DORADO 8X40X80 (BALLOONS) IMPLANT
BALLOON ULTRVRSE 5X60X75C (BALLOONS) IMPLANT
DEVICE PRESTO INFLATION (MISCELLANEOUS) ×4 IMPLANT
NDL ENTRY 21GA 7CM ECHOTIP (NEEDLE) IMPLANT
NEEDLE ENTRY 21GA 7CM ECHOTIP (NEEDLE) ×3 IMPLANT
SET INTRO CAPELLA COAXIAL (SET/KITS/TRAYS/PACK) ×2 IMPLANT
SHEATH BRITE TIP 6FRX5.5 (SHEATH) ×2 IMPLANT
SHEATH BRITE TIP 7FRX5.5 (SHEATH) ×2 IMPLANT
STENT COVERA FLARED 8X60X80 (Permanent Stent) ×2 IMPLANT
STENT COVERA STRAIGHT8X60X80 (Permanent Stent) ×2 IMPLANT
SUT MNCRL AB 4-0 PS2 18 (SUTURE) ×2 IMPLANT
WIRE MAGIC TOR.035 180C (WIRE) ×2 IMPLANT

## 2019-07-27 NOTE — H&P (Signed)
[facility] Admission History & Physical  MRN : [id number]  Dylan Sanders is a 86 y.o. (May 31, 1931) male who presents with chief complaint of  Chief Complaint  Patient presents with  . Fistula bleeding   History of Present Illness: I am asked to evaluate the patient by the dialysis center.  The patient endorses undergoing his first dialysis treatment today.  This was the first cannulation of his left brachial axillary graft which was created in March 10, 2019.  Patient reports his dialysis treatment went well however when the distal needle was removed he began to bleed briskly.  The proximal needle was left in place and the patient was transferred to the [facility] emergency department for further care.  The patient is unaware of any other change. Patient denies pain or tenderness overlying the access.  There is no pain with dialysis.  The patient denies hand pain or finger pain consistent with steal syndrome.   Vascular surgery was consulted by Dr. Cherylann Banas for further intervention.  No current facility-administered medications for this encounter.   Current Outpatient Medications  Medication Sig Dispense Refill  . allopurinol (ZYLOPRIM) 100 MG tablet Take 100 mg by mouth at bedtime.     Marland Kitchen amLODipine (NORVASC) 10 MG tablet Take 10 mg by mouth every morning.     Marland Kitchen aspirin EC 81 MG tablet Take 81 mg by mouth at bedtime.     . carvedilol (COREG) 3.125 MG tablet Take 1 tablet (3.125 mg total) by mouth 2 (two) times daily with a meal. 180 tablet 3  . cloNIDine (CATAPRES) 0.1 MG tablet Take 0.1 mg by mouth 2 (two) times daily.     . clopidogrel (PLAVIX) 75 MG tablet Take 1 tablet (75 mg total) by mouth daily. 90 tablet 4  . ferrous sulfate 325 (65 FE) MG EC tablet Take 1 tablet (325 mg total) by mouth 2 (two) times daily. 90 tablet 1  . LANTUS SOLOSTAR 100 UNIT/ML Solostar Pen Inject 10 Units into the skin every morning.     . psyllium  (METAMUCIL) 58.6 % powder Take 1 packet by mouth daily.    . simvastatin (ZOCOR) 20 MG tablet Take 20 mg by mouth at bedtime.    Marland Kitchen telmisartan (MICARDIS) 80 MG tablet Take 80 mg by mouth every morning.     . vitamin B-12 (CYANOCOBALAMIN) 1000 MCG tablet Take 1 tablet (1,000 mcg total) by mouth daily. 90 tablet 0  . vitamin C (ASCORBIC ACID) 500 MG tablet Take 1 tablet (500 mg total) by mouth daily. 90 tablet 0   Past Medical History:  Diagnosis Date  . Anemia   . Cancer [facility]) 2008   colon  . Chronic kidney disease   . Diabetes mellitus without complication ([place])   . Heart murmur    asymptomatic  . Hypertension    Past Surgical History:  Procedure Laterality Date  . AV FISTULA PLACEMENT Left 01/08/2019   Procedure: INSERTION OF ARTERIOVENOUS (AV) GORE-TEX GRAFT ARM ( BRACHIAL AXILLARY );  Surgeon: Katha Cabal, MD;  Location: [facility] ORS;  Service: Vascular;  Laterality: Left;  . COLON SURGERY    . EYE SURGERY     bilateral  . RENAL ANGIOGRAPHY Bilateral 11/19/2016   Procedure: Renal Angiography;  Surgeon: Katha Cabal, MD;  Location: [facility];  Service: Cardiovascular;  Laterality: Bilateral;  . RENAL ANGIOGRAPHY Right 07/03/2018   Procedure: RENAL ANGIOGRAPHY;  Surgeon: Katha Cabal, MD;  Location:  [facility];  Service: Cardiovascular;  Laterality: Right;  . REVERSE SHOULDER ARTHROPLASTY Left 09/19/2016   Procedure: REVERSE SHOULDER ARTHROPLASTY;  Surgeon: Corky Mull, MD;  Location: [facility] ORS;  Service: Orthopedics;  Laterality: Left;  . ROTATOR CUFF REPAIR Right    Social History Social History   Tobacco Use  . Smoking status: Former Smoker    Years: 25.00    Types: Pipe, Cigarettes, Cigars    Quit date: 09/05/1976    Years since quitting: 42.9  . Smokeless tobacco: Never Used  Substance Use Topics  . Alcohol use: Yes    Comment: ocassionally  . Drug use: No   Family History Family History  Problem Relation Age of Onset  . Cancer  Father   No family history of bleeding or clotting disorders, autoimmune disease or porphyria  Allergies  Allergen Reactions  . Ace Inhibitors Other (See Comments)   REVIEW OF SYSTEMS (Negative unless checked)  Constitutional: [] Weight loss  [] Fever  [] Chills Cardiac: [] Chest pain   [] Chest pressure   [] Palpitations   [] Shortness of breath when laying flat   [] Shortness of breath at rest   [x] Shortness of breath with exertion. Vascular:  [] Pain in legs with walking   [] Pain in legs at rest   [] Pain in legs when laying flat   [] Claudication   [] Pain in feet when walking  [] Pain in feet at rest  [] Pain in feet when laying flat   [] History of DVT   [] Phlebitis   [] Swelling in legs   [] Varicose veins   [] Non-healing ulcers Pulmonary:   [] Uses home oxygen   [] Productive cough   [] Hemoptysis   [] Wheeze  [] COPD   [] Asthma Neurologic:  [] Dizziness  [] Blackouts   [] Seizures   [] History of stroke   [] History of TIA  [] Aphasia   [] Temporary blindness   [] Dysphagia   [] Weakness or numbness in arms   [] Weakness or numbness in legs Musculoskeletal:  [] Arthritis   [] Joint swelling   [] Joint pain   [] Low back pain Hematologic:  [] Easy bruising  [] Easy bleeding   [] Hypercoagulable state   [] Anemic  [] Hepatitis Gastrointestinal:  [] Blood in stool   [] Vomiting blood  [] Gastroesophageal reflux/heartburn   [] Difficulty swallowing. Genitourinary:  [x] Chronic kidney disease   [] Difficult urination  [] Frequent urination  [] Burning with urination   [] Blood in urine Skin:  [] Rashes   [] Ulcers   [] Wounds Psychological:  [] History of anxiety   []  History of major depression.  Physical Examination  Vitals:   07/27/19 1441 07/27/19 1543  BP: (!) 212/81 (!) 192/71  Pulse: 64 66  Resp: 18 18  Temp: 97.8 F (36.6 C) 97.9 F (36.6 C)  TempSrc:  Oral  SpO2: 98% 97%  Weight: 67 kg   Height: 5\' 4"  (1.626 m)    Body mass index is 25.35 kg/m. Gen: WD/WN, NAD Head: [place]/AT, No temporalis wasting. Prominent temp pulse  not noted. Ear/Nose/Throat: Hearing grossly intact, nares w/o erythema or drainage, oropharynx w/o Erythema/Exudate,  Eyes: Conjunctiva clear, sclera non-icteric Neck: Trachea midline.  No JVD.  Pulmonary:  Good air movement, respirations not labored, no use of accessory muscles.  Cardiac: RRR, normal S1, S2. Vascular:  Vessel Right Left  Radial Palpable Palpable  Ulnar Not Palpable Not Palpable  Brachial Palpable Palpable  Carotid Palpable, without bruit Palpable, without bruit   Left upper extremity: Good bruit and thrill in graft.  Proximal needle intact.  Distal until site has stopped bleeding clean dry intact.  Gastrointestinal: soft, non-tender/non-distended. No guarding/reflex.  Musculoskeletal: M/S 5/5 throughout.  Extremities without ischemic changes.  No deformity or atrophy.  Neurologic: Sensation grossly intact in extremities.  Symmetrical.  Speech is fluent. Motor exam as listed above. Psychiatric: Judgment intact, Mood & affect appropriate for pt's clinical situation. Dermatologic: No rashes or ulcers noted.  No cellulitis or open wounds. Lymph : No Cervical, Axillary, or Inguinal lymphadenopathy.  CBC Lab Results  Component Value Date   WBC 4.9 07/27/2019   HGB 8.0 (L) 07/27/2019   HCT 22.8 (L) 07/27/2019   MCV 96.2 07/27/2019   PLT 162 07/27/2019   BMET    Component Value Date/Time   NA 143 07/27/2019 1531   NA 141 06/19/2011 1343   K 3.6 07/27/2019 1531   K 4.1 06/19/2011 1343   CL 108 07/27/2019 1531   CL 102 06/19/2011 1343   CO2 24 07/27/2019 1531   CO2 32 06/19/2011 1343   GLUCOSE 74 07/27/2019 1531   GLUCOSE 193 (H) 06/19/2011 1343   BUN 36 (H) 07/27/2019 1531   BUN 26 (H) 06/19/2011 1343   CREATININE 3.00 (H) 07/27/2019 1531   CREATININE 2.43 (H) 06/19/2011 1343   CALCIUM 8.6 (L) 07/27/2019 1531   CALCIUM 8.9 06/19/2011 1343   GFRNONAA 18 (L) 07/27/2019 1531   GFRNONAA 28 (L) 06/19/2011 1343   GFRAA 21 (L) 07/27/2019 1531   GFRAA 33 (L)  06/19/2011 1343   Estimated Creatinine Clearance: 15.1 mL/min (A) (by C-G formula based on SCr of 3 mg/dL (H)).  COAG Lab Results  Component Value Date   INR 1.0 12/25/2018   INR 1.08 09/05/2016   Radiology DG Chest 2 View  Result Date: 07/20/2019 CLINICAL DATA:  TB screening.  No complaints. EXAM: CHEST - 2 VIEW COMPARISON:  June 19, 2018 FINDINGS: There is elevation the right hemidiaphragm. Heart, hila, and mediastinum are normal. No pneumothorax. No nodules or masses. No focal infiltrates. No evidence of active TB identified. IMPRESSION: No active cardiopulmonary disease. Electronically Signed   By: Dorise Bullion III M.D   On: 07/20/2019 08:16   Assessment/Plan I am asked to evaluate the patient by the dialysis center.  The patient endorses undergoing his first dialysis treatment today.  This was the first cannulation of his left brachial axillary graft which was created in March 10, 2019.  Patient reports his dialysis treatment went well however when the distal needle was removed he began to bleed briskly.  The proximal needle was left in place and the patient was transferred to the [facility] emergency department for further care.  1.  Complication dialysis device with poorly functioning AV access:  Patient's left upper extremity dialysis access is malfunctioning. The patient will undergo angiography and correction of any problems using interventional techniques with the hope of restoring function to the access.  The risks and benefits were described to the patient.  All questions were answered.  The patient agrees to proceed with angiography and intervention. Potassium will be drawn to ensure that it is an appropriate level prior to performing intervention. 2.  End-stage renal disease requiring hemodialysis:  Patient will continue dialysis therapy without further interruption if a successful intervention is not achieved then a tunneled catheter will be placed.  Dialysis has already been arranged. 3.  Hypertension:  Patient will continue medical management; nephrology is following no changes in oral medications.  Discussed with Dr. Francene Castle, PA-C  07/27/2019 4:20 PM

## 2019-07-27 NOTE — ED Notes (Signed)
MD at bedside. 

## 2019-07-27 NOTE — Op Note (Signed)
OPERATIVE NOTE   PROCEDURE: 1. Contrast injection left brachial axillary AV access 2. Percutaneous transluminal angioplasty and stent placement venous outflow left brachial axillary AV graft  PRE-OPERATIVE DIAGNOSIS: Complication of dialysis access                                                       End Stage Renal Disease  POST-OPERATIVE DIAGNOSIS: same as above   SURGEON: Katha Cabal, M.D.  ANESTHESIA: Conscious sedation was administered under my direct supervision by the interventional radiology RN. IV Versed plus fentanyl were utilized. Continuous ECG, pulse oximetry and blood pressure was monitored throughout the entire procedure.  Conscious sedation was for a total of 35 minutes.  ESTIMATED BLOOD LOSS: minimal  FINDING(S): 1. String sign greater than 95% stenosis in the vein at the level of the venous anastomosis and axillary vein extending proximally 2 to 3 cm  SPECIMEN(S):  None  CONTRAST: 35 cc  FLUOROSCOPY TIME: 1.5 minutes  INDICATIONS: Dylan Sanders is a 86 y.o. male who  presents with malfunctioning left arm AV access.  The patient is scheduled for angiography with possible intervention of the AV access.  The patient is aware the risks include but are not limited to: bleeding, infection, thrombosis of the cannulated access, and possible anaphylactic reaction to the contrast.  The patient acknowledges if the access can not be salvaged a tunneled catheter will be needed and will be placed during this procedure.  The patient is aware of the risks of the procedure and elects to proceed with the angiogram and intervention.  DESCRIPTION: After full informed written consent was obtained, the patient was brought back to the Special Procedure suite and placed supine position.  Appropriate cardiopulmonary monitors were placed.  The left arm was prepped and draped in the standard fashion.  Appropriate timeout is called.  A pursestring suture was then placed around the  cannulation needle that had been left in the graft.  Once hemostasis had been achieved the arm was reprepped and draped in the usual fashion.  The left brachial axillary AV graft was cannulated with a micropuncture needle.  Cannulation was performed with ultrasound guidance. Ultrasound was placed in a sterile sleeve, the AV access was interrogated and noted to be echolucent and compressible indicating patency. Image was recorded for the permanent record. The puncture is performed under continuous ultrasound visualization.   The microwire was advanced and the needle was exchanged for  a microsheath.  The J-wire was then advanced and a 7 Fr sheath inserted.  Hand injections were completed to image the access from the arterial anastomosis through the entire access.  The central venous structures were also imaged by hand injections.  The graft itself is widely patent.  There is a string sign, greater than 95% stenosis in the vein at the level of the venous anastomosis and axillary vein extending proximally 2 to 3 cm.  The central veins are widely patent.  Based on the images,  3000 units of heparin was given and a wire was negotiated through the strictures within the venous portion of the graft.  Initially a 5 mm x 60 mm Ultraverse balloon was used to predilate the lesion in preparation for stent placement because the lesion was so occlusive.  An 8 mm x 60 mm flared Covera was deployed across  the stenoses a second 8 mm x 60 straight Covera was then also used.  The stents were overlapped across the lesion and postdilated with an 8 mm Dorado balloon inflated to 16 to 24 atm.  Follow-up imaging demonstrates complete resolution of the stricture, less than 5% residual stenosis, with rapid flow of contrast through the graft, the central venous anatomy is preserved.  A 4-0 Monocryl purse-string suture was sewn around the sheath.  The sheath was removed and light pressure was applied.  A sterile bandage was applied to  the puncture site.    COMPLICATIONS: None  CONDITION: Dylan Sanders, M.D [facility] Office: [phone]  07/27/2019 6:00 PM

## 2019-07-27 NOTE — ED Notes (Signed)
Vascular at bedside

## 2019-07-27 NOTE — Discharge Instructions (Signed)
Dialysis Fistulogram, Care After This sheet gives you information about how to care for yourself after your procedure. Your health care provider may also give you more specific instructions. If you have problems or questions, contact your health care provider. What can I expect after the procedure? After the procedure, it is common to have:  A small amount of discomfort in the area where the small, thin tube (catheter) was placed for the procedure.  A small amount of bruising around the fistula.  Sleepiness and tiredness (fatigue). Follow these instructions at home: Activity   Rest at home and do not lift anything that is heavier than 5 lb (2.3 kg) on the day after your procedure.  Return to your normal activities as told by your health care provider. Ask your health care provider what activities are safe for you.  Do not drive or use heavy machinery while taking prescription pain medicine.  Do not drive for 24 hours if you were given a medicine to help you relax (sedative) during your procedure. Medicines   Take over-the-counter and prescription medicines only as told by your health care provider. Puncture site care  Follow instructions from your health care provider about how to take care of the site where catheters were inserted. Make sure you: ? Wash your hands with soap and water before you change your bandage (dressing). If soap and water are not available, use hand sanitizer. ? Change your dressing as told by your health care provider. ? Leave stitches (sutures), skin glue, or adhesive strips in place. These skin closures may need to stay in place for 2 weeks or longer. If adhesive strip edges start to loosen and curl up, you may trim the loose edges. Do not remove adhesive strips completely unless your health care provider tells you to do that.  Check your puncture area every day for signs of infection. Check for: ? Redness, swelling, or pain. ? Fluid or  blood. ? Warmth. ? Pus or a bad smell. General instructions  Do not take baths, swim, or use a hot tub until your health care provider approves. Ask your health care provider if you may take showers. You may only be allowed to take sponge baths.  Monitor your dialysis fistula closely. Check to make sure that you can feel a vibration or buzz (a thrill) when you put your fingers over the fistula.  Prevent damage to your graft or fistula: ? Do not wear tight-fitting clothing or jewelry on the arm or leg that has your graft or fistula. ? Tell all your health care providers that you have a dialysis fistula or graft. ? Do not allow blood draws, IVs, or blood pressure readings to be done in the arm that has your fistula or graft. ? Do not allow flu shots or vaccinations in the arm with your fistula or graft.  Keep all follow-up visits as told by your health care provider. This is important. Contact a health care provider if:  You have redness, swelling, or pain at the site where the catheter was put in.  You have fluid or blood coming from the catheter site.  The catheter site feels warm to the touch.  You have pus or a bad smell coming from the catheter site.  You have a fever or chills. Get help right away if:  You feel weak.  You have trouble balancing.  You have trouble moving your arms or legs.  You have problems with your speech or vision.  You   can no longer feel a vibration or buzz when you put your fingers over your dialysis fistula.  The limb that was used for the procedure: ? Swells. ? Is painful. ? Is cold. ? Is discolored, such as blue or pale white.  You have chest pain or shortness of breath. Summary  After a dialysis fistulogram, it is common to have a small amount of discomfort or bruising in the area where the small, thin tube (catheter) was placed.  Rest at home on the day after your procedure. Return to your normal activities as told by your health care  provider.  Take over-the-counter and prescription medicines only as told by your health care provider.  Follow instructions from your health care provider about how to take care of the site where the catheter was inserted.  Keep all follow-up visits as told by your health care provider. This information is not intended to replace advice given to you by your health care provider. Make sure you discuss any questions you have with your health care provider. Document Revised: 05/30/2017 Document Reviewed: 05/30/2017 Elsevier Patient Education  [place]. Moderate Conscious Sedation, Adult, Care After These instructions provide you with information about caring for yourself after your procedure. Your health care provider may also give you more specific instructions. Your treatment has been planned according to current medical practices, but problems sometimes occur. Call your health care provider if you have any problems or questions after your procedure. What can I expect after the procedure? After your procedure, it is common:  To feel sleepy for several hours.  To feel clumsy and have poor balance for several hours.  To have poor judgment for several hours.  To vomit if you eat too soon. Follow these instructions at home: For at least 24 hours after the procedure:   Do not: ? Participate in activities where you could fall or become injured. ? Drive. ? Use heavy machinery. ? Drink alcohol. ? Take sleeping pills or medicines that cause drowsiness. ? Make important decisions or sign legal documents. ? Take care of children on your own.  Rest. Eating and drinking  Follow the diet recommended by your health care provider.  If you vomit: ? Drink water, juice, or soup when you can drink without vomiting. ? Make sure you have little or no nausea before eating solid foods. General instructions  Have a responsible adult stay with you until you are awake and alert.  Take  over-the-counter and prescription medicines only as told by your health care provider.  If you smoke, do not smoke without supervision.  Keep all follow-up visits as told by your health care provider. This is important. Contact a health care provider if:  You keep feeling nauseous or you keep vomiting.  You feel light-headed.  You develop a rash.  You have a fever. Get help right away if:  You have trouble breathing. This information is not intended to replace advice given to you by your health care provider. Make sure you discuss any questions you have with your health care provider. Document Revised: 04/11/2017 Document Reviewed: 08/19/2015 Elsevier Patient Education  [place]. Vascular Surgery Discharge Instructions  1) Call your dialysis center to find out when to come for your next treatment. 2) No heavy lifting greater than 10 pounds for the next week.  3) You may shower as of tomorrow.

## 2019-07-27 NOTE — ED Provider Notes (Signed)
[facility] Emergency Department Provider Note ____________________________________________   First MD Initiated Contact with Patient 07/27/19 1428     (approximate)  I have reviewed the triage vital signs and the nursing notes.   HISTORY  Chief Complaint Fistula bleeding    HPI Dylan Sanders is a 86 y.o. male with PMH as noted below who presents with bleeding from his left arm fistula at the end of dialysis today when the needles were being taken out.  The first site started bleeding heavily, so the needle was left in the second site.  A clamp was applied.  The patient denies any other acute complaints.  Past Medical History:  Diagnosis Date  . Anemia   . Cancer [facility]) 2008   colon  . Chronic kidney disease   . Diabetes mellitus without complication ([place])   . Heart murmur    asymptomatic  . Hypertension     Patient Active Problem List   Diagnosis Date Noted  . MGUS (monoclonal gammopathy of unknown significance) 03/20/2019  . Macrocytic anemia 03/20/2019  . Anemia in stage 5 chronic kidney disease, not on chronic dialysis ([place]) 03/20/2019  . Complication of vascular access for dialysis 02/18/2019  . Neck pain of over 3 months duration 01/27/2019  . Benign essential hypertension 12/14/2018  . Aortic valve disorder 04/08/2017  . Carotid bruit 04/08/2017  . Status post reverse total shoulder replacement, left 09/19/2016  . Renal artery stenosis ([place]) 05/02/2016  . Malignant hypertension 05/02/2016  . Type II diabetes mellitus with manifestations ([place]) 05/02/2016  . Hyperlipidemia 05/02/2016  . CKD (chronic kidney disease) stage 5, GFR less than 15 ml/min (HCC) 08/31/2015  . Pure hypercholesterolemia 06/02/2015  . History of colon cancer 03/18/2014  . DDD (degenerative disc disease), cervical 12/01/2013  . Cervical spondylosis 12/01/2013    Past Surgical History:  Procedure Laterality Date  . AV FISTULA PLACEMENT Left 01/08/2019   Procedure: INSERTION OF ARTERIOVENOUS (AV) GORE-TEX GRAFT ARM ( BRACHIAL AXILLARY );  Surgeon: Katha Cabal, MD;  Location: [facility] ORS;  Service: Vascular;  Laterality: Left;  . COLON SURGERY    . EYE SURGERY     bilateral  . RENAL ANGIOGRAPHY Bilateral 11/19/2016   Procedure: Renal Angiography;  Surgeon: Katha Cabal, MD;  Location: [place] [facility];  Service: Cardiovascular;  Laterality: Bilateral;  . RENAL ANGIOGRAPHY Right 07/03/2018   Procedure: RENAL ANGIOGRAPHY;  Surgeon: Katha Cabal, MD;  Location: [place] [facility];  Service: Cardiovascular;  Laterality: Right;  . REVERSE SHOULDER ARTHROPLASTY Left 09/19/2016   Procedure: REVERSE SHOULDER ARTHROPLASTY;  Surgeon: Corky Mull, MD;  Location: [facility] ORS;  Service: Orthopedics;  Laterality: Left;  . ROTATOR CUFF REPAIR Right     Prior to Admission medications   Medication Sig Start Date End Date Taking? Authorizing Provider  allopurinol (ZYLOPRIM) 100 MG tablet Take 100 mg by mouth at bedtime.  08/31/15   [provider]  amLODipine (NORVASC) 10 MG tablet Take 10 mg by mouth every morning.     [provider]  aspirin EC 81 MG tablet Take 81 mg by mouth at bedtime.     [provider]  carvedilol (COREG) 3.125 MG tablet Take 1 tablet (3.125 mg total) by mouth 2 (two) times daily with a meal. 04/07/19   Gollan, Kathlene November, MD  cloNIDine (CATAPRES) 0.1 MG tablet Take 0.1 mg by mouth 2 (two) times daily.  06/11/18   [provider]  clopidogrel (PLAVIX) 75 MG  tablet Take 1 tablet (75 mg total) by mouth daily. 01/27/19 04/21/20  Kris Hartmann, NP  ferrous sulfate 325 (65 FE) MG EC tablet Take 1 tablet (325 mg total) by mouth 2 (two) times daily. 04/16/19   [name], MD  LANTUS SOLOSTAR 100 UNIT/ML Solostar Pen Inject 10 Units into the skin every morning.  03/28/16   [provider]  psyllium (METAMUCIL) 58.6 % powder Take 1 packet by mouth daily.    [provider]    simvastatin (ZOCOR) 20 MG tablet Take 20 mg by mouth at bedtime.    [provider]  telmisartan (MICARDIS) 80 MG tablet Take 80 mg by mouth every morning.  10/02/17 07/13/19  [provider]  vitamin B-12 (CYANOCOBALAMIN) 1000 MCG tablet Take 1 tablet (1,000 mcg total) by mouth daily. 03/19/19   [name], MD  vitamin C (ASCORBIC ACID) 500 MG tablet Take 1 tablet (500 mg total) by mouth daily. 04/16/19   [name], MD    Allergies Ace inhibitors  Family History  Problem Relation Age of Onset  . Cancer Father     Social History Social History   Tobacco Use  . Smoking status: Former Smoker    Years: 25.00    Types: Pipe, Cigarettes, Cigars    Quit date: 09/05/1976    Years since quitting: 42.9  . Smokeless tobacco: Never Used  Substance Use Topics  . Alcohol use: Yes    Comment: ocassionally  . Drug use: No    Review of Systems  Constitutional: No fever. Eyes: No visual changes. ENT: No sore throat. Cardiovascular: Denies chest pain. Respiratory: Denies shortness of breath. Gastrointestinal: No vomiting or diarrhea.  Genitourinary: Negative for dysuria.  Musculoskeletal: Negative for back pain. Skin: Negative for rash. Neurological: Negative for headache.   ____________________________________________   PHYSICAL EXAM:  VITAL SIGNS: ED Triage Vitals [07/27/19 1441]  Enc Vitals Group     BP (!) 212/81     Pulse Rate 64     Resp 18     Temp 97.8 F (36.6 C)     Temp src      SpO2 98 %     Weight 147 lb 11.2 oz (67 kg)     Height 5\' 4"  (1.626 m)     Head Circumference      Peak Flow      Pain Score 0     Pain Loc      Pain Edu?      Excl. in [place]?     Constitutional: Alert and oriented. Well appearing and in no acute distress. Eyes: Conjunctivae are normal.  Head: Atraumatic. Nose: No congestion/rhinnorhea. Mouth/Throat: Mucous membranes are moist.   Neck: Normal range of motion.  Cardiovascular: Good peripheral  circulation. Respiratory: Normal respiratory effort.  No retractions.  Gastrointestinal: No distention.  Musculoskeletal: Extremities warm and well perfused.  Left arm fistula site clean and intact.  No active bleeding. Neurologic:  Normal speech and language. No gross focal neurologic deficits are appreciated.  Skin:  Skin is warm and dry. No rash noted. Psychiatric: Mood and affect are normal. Speech and behavior are normal.  ____________________________________________   LABS (all labs ordered are listed, but only abnormal results are displayed)  Labs Reviewed  RESPIRATORY PANEL BY RT PCR (FLU A&B, COVID)  CBC  BASIC METABOLIC PANEL   ____________________________________________  EKG   ____________________________________________  RADIOLOGY    ____________________________________________   PROCEDURES  Procedure(s) performed: No  Procedures  Critical Care  performed: No ____________________________________________   INITIAL IMPRESSION / ASSESSMENT AND PLAN / ED COURSE  Pertinent labs & imaging results that were available during my care of the patient were reviewed by me and considered in my medical decision making (see chart for details).  86 year old male status post dialysis presents with bleeding from the left arm fistula site.  Apparently the patient completed the dialysis session and the bleeding began when one of the needles was removed.  The other needle is in place.  The area was clamped and dressed.  The patient has no other complaints.  On arrival to the ED, the clamp was removed and the bleeding has stopped.  The patient is significantly hypertensive, however he has no symptoms related to this.  His other vital signs are normal.  The remainder of the physical exam is unremarkable.  I discussed the case with Dr. Juleen China as well as with Dr. Delana Meyer from vascular surgery.  The patient will go for a fistulogram, and then will be discharged if  stable.  ____________________________________________   FINAL CLINICAL IMPRESSION(S) / ED DIAGNOSES  Final diagnoses:  Bleeding      NEW MEDICATIONS STARTED DURING THIS VISIT:  New Prescriptions   No medications on file     Note:  This document was prepared using Dragon voice recognition software and may include unintentional dictation errors.    Arta Silence, MD 07/27/19 1537

## 2019-07-27 NOTE — ED Notes (Signed)
Pt signed consent. Pt given gown and instructed to take all clothes off. Pt informed that we just need to wait until COVID results and then he would go to OR

## 2019-07-27 NOTE — ED Triage Notes (Addendum)
Pt arrvies via [facility] from Dialysis with one hour left of treatment. Pt's fistula bleeding per EMS and dialysis unable to get it to stop. EMS states MD Stephenie Acres was at bedside and would be coming to ED shorlty.  Pt is A*OX4. QV-672/09, HR-82. Pt states this was his first dialysis treatment. 2nd cath still in place. Bleeding controlled with clamp in place at this time.

## 2019-07-28 ENCOUNTER — Encounter: Payer: Self-pay | Admitting: Cardiology

## 2019-07-28 ENCOUNTER — Inpatient Hospital Stay: Payer: PPO

## 2019-07-28 VITALS — BP 151/57 | HR 69

## 2019-07-28 DIAGNOSIS — N185 Chronic kidney disease, stage 5: Secondary | ICD-10-CM | POA: Diagnosis not present

## 2019-07-28 DIAGNOSIS — D631 Anemia in chronic kidney disease: Secondary | ICD-10-CM

## 2019-07-28 MED ORDER — EPOETIN ALFA-EPBX 10000 UNIT/ML IJ SOLN
20000.0000 [IU] | Freq: Once | INTRAMUSCULAR | Status: AC
  Administered 2019-07-28: 20000 [IU] via SUBCUTANEOUS
  Filled 2019-07-28: qty 2

## 2019-08-04 ENCOUNTER — Inpatient Hospital Stay: Payer: PPO

## 2019-08-04 VITALS — BP 140/50 | HR 61 | Temp 97.7°F | Resp 18

## 2019-08-04 DIAGNOSIS — D631 Anemia in chronic kidney disease: Secondary | ICD-10-CM

## 2019-08-04 DIAGNOSIS — N185 Chronic kidney disease, stage 5: Secondary | ICD-10-CM | POA: Diagnosis not present

## 2019-08-04 MED ORDER — SODIUM CHLORIDE 0.9 % IV SOLN
Freq: Once | INTRAVENOUS | Status: AC
  Filled 2019-08-04: qty 250

## 2019-08-04 MED ORDER — IRON SUCROSE 20 MG/ML IV SOLN
200.0000 mg | Freq: Once | INTRAVENOUS | Status: AC
  Administered 2019-08-04: 200 mg via INTRAVENOUS
  Filled 2019-08-04: qty 10

## 2019-08-04 NOTE — Progress Notes (Signed)
Pt tolerated infusion well. Pt and VS stable at discharge.  

## 2019-08-11 DIAGNOSIS — E119 Type 2 diabetes mellitus without complications: Secondary | ICD-10-CM | POA: Diagnosis not present

## 2019-08-11 DIAGNOSIS — N186 End stage renal disease: Secondary | ICD-10-CM | POA: Diagnosis not present

## 2019-08-11 DIAGNOSIS — Z992 Dependence on renal dialysis: Secondary | ICD-10-CM | POA: Diagnosis not present

## 2019-08-12 DIAGNOSIS — Z992 Dependence on renal dialysis: Secondary | ICD-10-CM | POA: Diagnosis not present

## 2019-08-12 DIAGNOSIS — N186 End stage renal disease: Secondary | ICD-10-CM | POA: Diagnosis not present

## 2019-08-12 DIAGNOSIS — E1122 Type 2 diabetes mellitus with diabetic chronic kidney disease: Secondary | ICD-10-CM | POA: Diagnosis not present

## 2019-08-12 DIAGNOSIS — D509 Iron deficiency anemia, unspecified: Secondary | ICD-10-CM | POA: Diagnosis not present

## 2019-08-12 DIAGNOSIS — N2581 Secondary hyperparathyroidism of renal origin: Secondary | ICD-10-CM | POA: Diagnosis not present

## 2019-08-12 DIAGNOSIS — D631 Anemia in chronic kidney disease: Secondary | ICD-10-CM | POA: Diagnosis not present

## 2019-08-17 ENCOUNTER — Ambulatory Visit: Payer: PPO

## 2019-08-17 ENCOUNTER — Other Ambulatory Visit: Payer: PPO

## 2019-08-17 ENCOUNTER — Ambulatory Visit: Payer: PPO | Admitting: Oncology

## 2019-08-19 ENCOUNTER — Other Ambulatory Visit (INDEPENDENT_AMBULATORY_CARE_PROVIDER_SITE_OTHER): Payer: Self-pay | Admitting: Vascular Surgery

## 2019-08-19 DIAGNOSIS — T829XXS Unspecified complication of cardiac and vascular prosthetic device, implant and graft, sequela: Secondary | ICD-10-CM

## 2019-08-19 DIAGNOSIS — N186 End stage renal disease: Secondary | ICD-10-CM

## 2019-08-20 ENCOUNTER — Ambulatory Visit (INDEPENDENT_AMBULATORY_CARE_PROVIDER_SITE_OTHER): Payer: PPO

## 2019-08-20 ENCOUNTER — Other Ambulatory Visit: Payer: Self-pay

## 2019-08-20 ENCOUNTER — Encounter (INDEPENDENT_AMBULATORY_CARE_PROVIDER_SITE_OTHER): Payer: Self-pay | Admitting: Nurse Practitioner

## 2019-08-20 ENCOUNTER — Ambulatory Visit (INDEPENDENT_AMBULATORY_CARE_PROVIDER_SITE_OTHER): Payer: PPO | Admitting: Nurse Practitioner

## 2019-08-20 VITALS — BP 135/57 | HR 73 | Resp 16 | Wt 142.8 lb

## 2019-08-20 DIAGNOSIS — T829XXS Unspecified complication of cardiac and vascular prosthetic device, implant and graft, sequela: Secondary | ICD-10-CM

## 2019-08-20 DIAGNOSIS — N186 End stage renal disease: Secondary | ICD-10-CM

## 2019-08-20 DIAGNOSIS — I10 Essential (primary) hypertension: Secondary | ICD-10-CM | POA: Diagnosis not present

## 2019-08-24 ENCOUNTER — Other Ambulatory Visit (INDEPENDENT_AMBULATORY_CARE_PROVIDER_SITE_OTHER): Payer: Self-pay | Admitting: Nurse Practitioner

## 2019-08-24 ENCOUNTER — Encounter (INDEPENDENT_AMBULATORY_CARE_PROVIDER_SITE_OTHER): Payer: Self-pay | Admitting: Nurse Practitioner

## 2019-08-24 NOTE — Progress Notes (Signed)
Subjective:    Patient ID: Dylan Sanders, male    DOB: 1932/02/07, 86 y.o.   MRN: [id number] Chief Complaint  Patient presents with  . Follow-up    ultrasound follow up    The patient returns to the office for followup of their dialysis access.  Patient is currently maintained via a left brachial axillary AV graft.  He recently underwent angioplasty and stenting of the venous outflow on 07/27/2019.  The patient does have some issues with cannulation however he mainly attributes that to the technician.  He states that the more experienced technicians have no issues however there is some of the newer technicians that do have issues in required multiple sticks.  The function of the access has been stable. The patient denies increased bleeding time or increased recirculation. Patient denies difficulty with cannulation. The patient denies hand pain or other symptoms consistent with steal phenomena.  No significant arm swelling.  The patient denies redness or swelling at the access site. The patient denies fever or chills at home or while on dialysis.  The patient denies amaurosis fugax or recent TIA symptoms. There are no recent neurological changes noted. The patient denies claudication symptoms or rest pain symptoms. The patient denies history of DVT, PE or superficial thrombophlebitis. The patient denies recent episodes of angina or shortness of breath.   Today noninvasive studies show a flow volume of 1717.  The patient has a patent left brachial axillary AV graft and there is no focal hemodynamically significant velocity increases.  There is a mild to moderate increased velocity at the proximal anastomosis which may be due to the angle of the graft from the brachial artery and there is a mild change in diameter.  When compared to the previous exam on 02/19/2019 the distal anastomosis is improved however there is a large increase in proximal anastomosis velocity.  Previously placed stents are  patent.        Review of Systems  Musculoskeletal: Positive for arthralgias.  Hematological: Bruises/bleeds easily.  All other systems reviewed and are negative.      Objective:   Physical Exam Vitals reviewed.  HENT:     Head: Normocephalic.  Cardiovascular:     Rate and Rhythm: Normal rate and regular rhythm.     Pulses: Normal pulses.     Arteriovenous access: left arteriovenous access is present.    Comments: Left brachial axillary graft, good thrill and bruit Musculoskeletal:        General: Normal range of motion.  Neurological:     Mental Status: He is alert and oriented to person, place, and time.  Psychiatric:        Mood and Affect: Mood normal.        Behavior: Behavior normal.        Thought Content: Thought content normal.        Judgment: Judgment normal.     BP (!) 135/57 (BP Location: Right Arm)   Pulse 73   Resp 16   Wt 142 lb 12.8 oz (64.8 kg)   BMI 24.51 kg/m   Past Medical History:  Diagnosis Date  . Anemia   . Cancer [facility]) 2008   colon  . Chronic kidney disease   . Diabetes mellitus without complication ([place])   . Heart murmur    asymptomatic  . Hypertension     Social History   Socioeconomic History  . Marital status: Widowed    Spouse name: Not on file  . Number  of children: Not on file  . Years of education: Not on file  . Highest education level: Not on file  Occupational History  . Not on file  Tobacco Use  . Smoking status: Former Smoker    Years: 25.00    Types: Pipe, Cigarettes, Cigars    Quit date: 09/05/1976    Years since quitting: 42.9  . Smokeless tobacco: Never Used  Substance and Sexual Activity  . Alcohol use: Yes    Comment: ocassionally  . Drug use: No  . Sexual activity: Never  Other Topics Concern  . Not on file  Social History Narrative  . Not on file   Social Determinants of Health   Financial Resource Strain:   . Difficulty of Paying Living Expenses:   Food Insecurity:   . Worried About  Charity fundraiser in the Last Year:   . Arboriculturist in the Last Year:   Transportation Needs:   . Film/video editor (Medical):   Marland Kitchen Lack of Transportation (Non-Medical):   Physical Activity:   . Days of Exercise per Week:   . Minutes of Exercise per Session:   Stress:   . Feeling of Stress :   Social Connections:   . Frequency of Communication with Friends and Family:   . Frequency of Social Gatherings with Friends and Family:   . Attends Religious Services:   . Active Member of Clubs or Organizations:   . Attends Archivist Meetings:   Marland Kitchen Marital Status:   Intimate Partner Violence:   . Fear of Current or Ex-Partner:   . Emotionally Abused:   Marland Kitchen Physically Abused:   . Sexually Abused:     Past Surgical History:  Procedure Laterality Date  . A/V SHUNT INTERVENTION Left 07/27/2019   Procedure: A/V SHUNT INTERVENTION;  Surgeon: Katha Cabal, MD;  Location: [place] [facility];  Service: Cardiovascular;  Laterality: Left;  . AV FISTULA PLACEMENT Left 01/08/2019   Procedure: INSERTION OF ARTERIOVENOUS (AV) GORE-TEX GRAFT ARM ( BRACHIAL AXILLARY );  Surgeon: Katha Cabal, MD;  Location: [facility] OR[facility];  Service: Vascular;  Laterality: Left;  . COLON SURGERY    . EYE SURGERY     bilateral  . RENAL ANGIOGRAPHY Bilateral 11/19/2016   Procedure: Renal Angiography;  Surgeon: Katha Cabal, MD;  Location: [place] [facility];  Service: Cardiovascular;  Laterality: Bilateral;  . RENAL ANGIOGRAPHY Right 07/03/2018   Procedure: RENAL ANGIOGRAPHY;  Surgeon: Katha Cabal, MD;  Location: [place] [facility];  Service: Cardiovascular;  Laterality: Right;  . REVERSE SHOULDER ARTHROPLASTY Left 09/19/2016   Procedure: REVERSE SHOULDER ARTHROPLASTY;  Surgeon: Corky Mull, MD;  Location: [facility] OR[facility];  Service: Orthopedics;  Laterality: Left;  . ROTATOR CUFF REPAIR Right     Family History  Problem Relation Age of Onset  . Cancer Father     Allergies  Allergen  Reactions  . Ace Inhibitors Other (See Comments)       Assessment & Plan:   1. ESRD (end stage renal disease) ([place]) Recommend:  The patient is doing well and currently has adequate dialysis access. The patient's dialysis center is not reporting any access issues. Flow pattern is stable when compared to the prior ultrasound.  The patient should have a duplex ultrasound of the dialysis access in 6 months. The patient will follow-up with me in the office after each ultrasound     2. Benign essential hypertension Blood pressure under good control today.  Patient on appropriate medications.  No changes made today.   Current Outpatient Medications on File Prior to Visit  Medication Sig Dispense Refill  . allopurinol (ZYLOPRIM) 100 MG tablet Take 100 mg by mouth at bedtime.     Marland Kitchen amLODipine (NORVASC) 10 MG tablet Take 10 mg by mouth every morning.     Marland Kitchen aspirin EC 81 MG tablet Take 81 mg by mouth at bedtime.     . B Complex-C-Folic Acid (RENA-VITE RX) 1 MG TABS Take 1 tablet by mouth daily.    . carvedilol (COREG) 3.125 MG tablet Take 1 tablet (3.125 mg total) by mouth 2 (two) times daily with a meal. 180 tablet 3  . cloNIDine (CATAPRES) 0.1 MG tablet Take 0.1 mg by mouth 2 (two) times daily.     . clopidogrel (PLAVIX) 75 MG tablet Take 1 tablet (75 mg total) by mouth daily. 90 tablet 4  . ferrous sulfate 325 (65 FE) MG EC tablet Take 1 tablet (325 mg total) by mouth 2 (two) times daily. 90 tablet 1  . LANTUS SOLOSTAR 100 UNIT/ML Solostar Pen Inject 10 Units into the skin every morning.     . psyllium (METAMUCIL) 58.6 % powder Take 1 packet by mouth daily.    . simvastatin (ZOCOR) 20 MG tablet Take 20 mg by mouth at bedtime.    . vitamin B-12 (CYANOCOBALAMIN) 1000 MCG tablet Take 1 tablet (1,000 mcg total) by mouth daily. 90 tablet 0  . vitamin C (ASCORBIC ACID) 500 MG tablet Take 1 tablet (500 mg total) by mouth daily. 90 tablet 0  . telmisartan (MICARDIS) 80 MG tablet Take 80 mg by  mouth every morning.      No current facility-administered medications on file prior to visit.    There are no Patient Instructions on file for this visit. No follow-ups on file.   Kris Hartmann, NP

## 2019-08-25 ENCOUNTER — Inpatient Hospital Stay: Payer: PPO

## 2019-08-25 ENCOUNTER — Encounter: Payer: Self-pay | Admitting: Oncology

## 2019-08-25 ENCOUNTER — Other Ambulatory Visit: Payer: Self-pay

## 2019-08-25 ENCOUNTER — Inpatient Hospital Stay: Payer: PPO | Attending: Oncology | Admitting: Oncology

## 2019-08-25 VITALS — BP 158/85 | HR 75 | Temp 96.5°F | Resp 16 | Wt 143.3 lb

## 2019-08-25 DIAGNOSIS — Z79899 Other long term (current) drug therapy: Secondary | ICD-10-CM | POA: Insufficient documentation

## 2019-08-25 DIAGNOSIS — D472 Monoclonal gammopathy: Secondary | ICD-10-CM | POA: Diagnosis not present

## 2019-08-25 DIAGNOSIS — D631 Anemia in chronic kidney disease: Secondary | ICD-10-CM

## 2019-08-25 DIAGNOSIS — I120 Hypertensive chronic kidney disease with stage 5 chronic kidney disease or end stage renal disease: Secondary | ICD-10-CM | POA: Insufficient documentation

## 2019-08-25 DIAGNOSIS — Z87891 Personal history of nicotine dependence: Secondary | ICD-10-CM | POA: Diagnosis not present

## 2019-08-25 DIAGNOSIS — N185 Chronic kidney disease, stage 5: Secondary | ICD-10-CM

## 2019-08-25 DIAGNOSIS — Z85038 Personal history of other malignant neoplasm of large intestine: Secondary | ICD-10-CM

## 2019-08-25 DIAGNOSIS — R5382 Chronic fatigue, unspecified: Secondary | ICD-10-CM | POA: Insufficient documentation

## 2019-08-25 DIAGNOSIS — E1122 Type 2 diabetes mellitus with diabetic chronic kidney disease: Secondary | ICD-10-CM | POA: Diagnosis not present

## 2019-08-25 LAB — CBC WITH DIFFERENTIAL/PLATELET
Abs Immature Granulocytes: 0.02 10*3/uL (ref 0.00–0.07)
Basophils Absolute: 0 10*3/uL (ref 0.0–0.1)
Basophils Relative: 1 %
Eosinophils Absolute: 0.3 10*3/uL (ref 0.0–0.5)
Eosinophils Relative: 5 %
HCT: 29.7 % — ABNORMAL LOW (ref 39.0–52.0)
Hemoglobin: 9.6 g/dL — ABNORMAL LOW (ref 13.0–17.0)
Immature Granulocytes: 0 %
Lymphocytes Relative: 19 %
Lymphs Abs: 1 10*3/uL (ref 0.7–4.0)
MCH: 34 pg (ref 26.0–34.0)
MCHC: 32.3 g/dL (ref 30.0–36.0)
MCV: 105.3 fL — ABNORMAL HIGH (ref 80.0–100.0)
Monocytes Absolute: 0.6 10*3/uL (ref 0.1–1.0)
Monocytes Relative: 13 %
Neutro Abs: 3.1 10*3/uL (ref 1.7–7.7)
Neutrophils Relative %: 62 %
Platelets: 156 10*3/uL (ref 150–400)
RBC: 2.82 MIL/uL — ABNORMAL LOW (ref 4.22–5.81)
RDW: 14.6 % (ref 11.5–15.5)
WBC: 5 10*3/uL (ref 4.0–10.5)
nRBC: 0 % (ref 0.0–0.2)

## 2019-08-25 LAB — FERRITIN: Ferritin: 362 ng/mL — ABNORMAL HIGH (ref 24–336)

## 2019-08-25 LAB — IRON AND TIBC
Iron: 68 ug/dL (ref 45–182)
Saturation Ratios: 30 % (ref 17.9–39.5)
TIBC: 225 ug/dL — ABNORMAL LOW (ref 250–450)
UIBC: 157 ug/dL

## 2019-08-25 NOTE — Progress Notes (Signed)
Patient has started dialysis on Tu, Th, and Sat.

## 2019-08-25 NOTE — Progress Notes (Signed)
Hematology/Oncology follow up note [facility] [phone] [phone]   Patient Care Team: [name], MD as PCP - General (Internal Medicine)  REFERRING PROVIDER: [name], MD  CHIEF COMPLAINTS/REASON FOR VISIT:  Follow-up for anemia in chronic kidney disease.  HISTORY OF PRESENTING ILLNESS:   Dylan Sanders is a  86 y.o.  male with PMH listed below was seen in consultation at the request of  [name], MD  for evaluation of anemia and chronic kidney disease. Patient has chronic kidney disease, secondary to longstanding high blood pressure.  Follows up with nephrology. He has had AV graft placed and is matured.  Last seen by nephrology on 02/23/2019, no indication to start dialysis yet. 02/11/2019, hemoglobin 8.5, hematocrit 26.3, MCV 102.3, Creatinine 3.86, EGFR 15. Negative hepatitis panel Patient was referred to heme-onc for evaluation of anemia in the setting of chronic kidney disease. Patient reports feeling well.  Chronic fatigue at baseline. Appetite is fair. Denies weight loss, fever, chills, fatigue, night sweats.  #Reported remote history of colon cancer 2008.  Status post resection. #Diarrhea, chronic.  No blood in the stool.  INTERVAL HISTORY Dylan Sanders is a 86 y.o. male who has above history reviewed by me today presents for follow up visit for management of anemia and chronic disease. Problems and complaints are listed below: Patient reports feeling well. He has started HD three time a week.  No new complaints.    Review of Systems  Constitutional: Positive for fatigue. Negative for appetite change, chills, fever and unexpected weight change.  HENT:   Negative for hearing loss and voice change.   Eyes: Negative for eye problems and icterus.  Respiratory: Negative for chest tightness, cough and shortness of breath.   Cardiovascular: Negative for chest pain and leg swelling.    Gastrointestinal: Negative for abdominal distention and abdominal pain.  Endocrine: Negative for hot flashes.  Genitourinary: Negative for difficulty urinating, dysuria and frequency.   Musculoskeletal: Negative for arthralgias.  Skin: Negative for itching and rash.  Neurological: Negative for light-headedness and numbness.  Hematological: Negative for adenopathy. Does not bruise/bleed easily.  Psychiatric/Behavioral: Negative for confusion.    MEDICAL HISTORY:  Past Medical History:  Diagnosis Date  . Anemia   . Cancer [facility]) 2008   colon  . Chronic kidney disease   . Diabetes mellitus without complication ([place])   . Heart murmur    asymptomatic  . Hypertension     SURGICAL HISTORY: Past Surgical History:  Procedure Laterality Date  . A/V SHUNT INTERVENTION Left 07/27/2019   Procedure: A/V SHUNT INTERVENTION;  Surgeon: Katha Cabal, MD;  Location: [place] [facility];  Service: Cardiovascular;  Laterality: Left;  . AV FISTULA PLACEMENT Left 01/08/2019   Procedure: INSERTION OF ARTERIOVENOUS (AV) GORE-TEX GRAFT ARM ( BRACHIAL AXILLARY );  Surgeon: Katha Cabal, MD;  Location: [facility];  Service: Vascular;  Laterality: Left;  . COLON SURGERY    . EYE SURGERY     bilateral  . RENAL ANGIOGRAPHY Bilateral 11/19/2016   Procedure: Renal Angiography;  Surgeon: Katha Cabal, MD;  Location: [place] [facility];  Service: Cardiovascular;  Laterality: Bilateral;  . RENAL ANGIOGRAPHY Right 07/03/2018   Procedure: RENAL ANGIOGRAPHY;  Surgeon: Katha Cabal, MD;  Location: [place] [facility];  Service: Cardiovascular;  Laterality: Right;  . REVERSE SHOULDER ARTHROPLASTY Left 09/19/2016   Procedure: REVERSE SHOULDER ARTHROPLASTY;  Surgeon: Corky Mull, MD;  Location: [facility];  Service: Orthopedics;  Laterality: Left;  . ROTATOR CUFF REPAIR Right     SOCIAL HISTORY: Social History   Socioeconomic History  . Marital status: Widowed    Spouse name: Not on file   . Number of children: Not on file  . Years of education: Not on file  . Highest education level: Not on file  Occupational History  . Not on file  Tobacco Use  . Smoking status: Former Smoker    Years: 25.00    Types: Pipe, Cigarettes, Cigars    Quit date: 09/05/1976    Years since quitting: 42.9  . Smokeless tobacco: Never Used  Substance and Sexual Activity  . Alcohol use: Yes    Comment: ocassionally  . Drug use: No  . Sexual activity: Never  Other Topics Concern  . Not on file  Social History Narrative  . Not on file   Social Determinants of Health   Financial Resource Strain:   . Difficulty of Paying Living Expenses:   Food Insecurity:   . Worried About Charity fundraiser in the Last Year:   . Arboriculturist in the Last Year:   Transportation Needs:   . Film/video editor (Medical):   Marland Kitchen Lack of Transportation (Non-Medical):   Physical Activity:   . Days of Exercise per Week:   . Minutes of Exercise per Session:   Stress:   . Feeling of Stress :   Social Connections:   . Frequency of Communication with Friends and Family:   . Frequency of Social Gatherings with Friends and Family:   . Attends Religious Services:   . Active Member of Clubs or Organizations:   . Attends Archivist Meetings:   Marland Kitchen Marital Status:   Intimate Partner Violence:   . Fear of Current or Ex-Partner:   . Emotionally Abused:   Marland Kitchen Physically Abused:   . Sexually Abused:     FAMILY HISTORY: Family History  Problem Relation Age of Onset  . Cancer Father     ALLERGIES:  is allergic to ace inhibitors.  MEDICATIONS:  Current Outpatient Medications  Medication Sig Dispense Refill  . allopurinol (ZYLOPRIM) 100 MG tablet Take 100 mg by mouth at bedtime.     Marland Kitchen amLODipine (NORVASC) 10 MG tablet Take 10 mg by mouth every morning.     Marland Kitchen aspirin EC 81 MG tablet Take 81 mg by mouth at bedtime.     . B Complex-C-Folic Acid (RENA-VITE RX) 1 MG TABS Take 1 tablet by mouth daily.     . carvedilol (COREG) 3.125 MG tablet Take 1 tablet (3.125 mg total) by mouth 2 (two) times daily with a meal. 180 tablet 3  . cloNIDine (CATAPRES) 0.1 MG tablet Take 0.1 mg by mouth 2 (two) times daily.     . clopidogrel (PLAVIX) 75 MG tablet Take 1 tablet (75 mg total) by mouth daily. 90 tablet 4  . ferrous sulfate 325 (65 FE) MG EC tablet Take 1 tablet (325 mg total) by mouth 2 (two) times daily. 90 tablet 1  . LANTUS SOLOSTAR 100 UNIT/ML Solostar Pen Inject 10 Units into the skin every morning.     . psyllium (METAMUCIL) 58.6 % powder Take 1 packet by mouth daily.    . simvastatin (ZOCOR) 20 MG tablet Take 20 mg by mouth at bedtime.    Marland Kitchen telmisartan (MICARDIS) 80 MG tablet Take 80 mg by mouth every morning.     . vitamin B-12 (CYANOCOBALAMIN) 1000 MCG  tablet Take 1 tablet (1,000 mcg total) by mouth daily. 90 tablet 0  . vitamin C (ASCORBIC ACID) 500 MG tablet Take 1 tablet (500 mg total) by mouth daily. 90 tablet 0   No current facility-administered medications for this visit.     PHYSICAL EXAMINATION: ECOG PERFORMANCE STATUS: 1 - Symptomatic but completely ambulatory Vitals:   08/25/19 1316  BP: (!) 158/85  Pulse: 75  Resp: 16  Temp: (!) 96.5 F (35.8 C)   Filed Weights   08/25/19 1316  Weight: 143 lb 4.8 oz (65 kg)    Physical Exam Constitutional:      General: He is not in acute distress. HENT:     Head: Normocephalic and atraumatic.  Eyes:     General: No scleral icterus. Cardiovascular:     Rate and Rhythm: Normal rate and regular rhythm.     Heart sounds: Murmur present.  Pulmonary:     Effort: Pulmonary effort is normal. No respiratory distress.     Breath sounds: No wheezing.  Abdominal:     General: Bowel sounds are normal. There is no distension.     Palpations: Abdomen is soft.  Musculoskeletal:        General: No deformity. Normal range of motion.     Cervical back: Normal range of motion and neck supple.  Skin:    General: Skin is warm and dry.      Findings: No erythema or rash.  Neurological:     Mental Status: He is alert and oriented to person, place, and time. Mental status is at baseline.     Cranial Nerves: No cranial nerve deficit.     Coordination: Coordination normal.  Psychiatric:        Mood and Affect: Mood normal.     LABORATORY DATA:  I have reviewed the data as listed Lab Results  Component Value Date   WBC 5.0 08/25/2019   HGB 9.6 (L) 08/25/2019   HCT 29.7 (L) 08/25/2019   MCV 105.3 (H) 08/25/2019   PLT 156 08/25/2019   Recent Labs    12/25/18 1155 01/08/19 0629 07/27/19 1531  NA 142 140 143  K 5.2* 4.1 3.6  CL 112*  --  108  CO2 24  --  24  GLUCOSE 97 128* 74  BUN 55*  --  36*  CREATININE 3.99*  --  3.00*  CALCIUM 8.9  --  8.6*  GFRNONAA 13*  --  18*  GFRAA 15*  --  21*   Iron/TIBC/Ferritin/ %Sat    Component Value Date/Time   IRON 68 08/25/2019 1256   TIBC 225 (L) 08/25/2019 1256   FERRITIN 362 (H) 08/25/2019 1256   IRONPCTSAT 30 08/25/2019 1256      RADIOGRAPHIC STUDIES: I have personally reviewed the radiological images as listed and agreed with the findings in the report.  PERIPHERAL VASCULAR CATHETERIZATION  Result Date: 07/27/2019 See op note     ASSESSMENT & PLAN:  1. Anemia in stage 5 chronic kidney disease, not on chronic dialysis ([place])   2. CKD (chronic kidney disease) stage 5, GFR less than 15 ml/min (HCC)    IgA MGUS Recommend to check MGUS at next visit.   # Anemia in chronic kidney disease.  Hemoglobin has improved to 9.6.  Since he has started on HD, his IV iron and EPO will be taken care by nephrologist. Communicated with Dr.Kolluro.   Remote history of colon cancer.  CEA was normal on 03/10/2019.   No orders of  the defined types were placed in this encounter.   All questions were answered. The patient knows to call the clinic with any problems questions or concerns.  cc [name], MD    Return of visit: 3 months    [name], MD,  PhD Hematology Oncology [facility] Pager- [phone] 08/25/2019

## 2019-08-26 ENCOUNTER — Telehealth: Payer: Self-pay

## 2019-08-26 NOTE — Telephone Encounter (Signed)
Done.... Pt has been sched to RTC in 41mths for labs and see MD 2 weeks after. Labs on 02/25/20 @ 11am and RTC on 03/10/20 see MD @ 10:00

## 2019-08-26 NOTE — Telephone Encounter (Signed)
-----   Message from [name], MD sent at 08/25/2019 10:44 PM EDT ----- I discharged him as nephrology will take over IV iron and retacrit.  He still has MGUS which need to be monitored. Please let kim know that I recommend him to still follow up with in 6 months and I will repeat his MGUS labs. -ordered.  Lab in 6 months and MD 2 weeks later. Thanks.

## 2019-08-26 NOTE — Telephone Encounter (Signed)
Patient has been notified of RTC appts.

## 2019-08-26 NOTE — Telephone Encounter (Signed)
Please schedule as requested. I will call and let him know about his appts. Thanks

## 2019-09-10 DIAGNOSIS — N186 End stage renal disease: Secondary | ICD-10-CM | POA: Diagnosis not present

## 2019-09-10 DIAGNOSIS — Z992 Dependence on renal dialysis: Secondary | ICD-10-CM | POA: Diagnosis not present

## 2019-09-11 DIAGNOSIS — N2581 Secondary hyperparathyroidism of renal origin: Secondary | ICD-10-CM | POA: Diagnosis not present

## 2019-09-11 DIAGNOSIS — E1122 Type 2 diabetes mellitus with diabetic chronic kidney disease: Secondary | ICD-10-CM | POA: Diagnosis not present

## 2019-09-11 DIAGNOSIS — Z992 Dependence on renal dialysis: Secondary | ICD-10-CM | POA: Diagnosis not present

## 2019-09-11 DIAGNOSIS — N186 End stage renal disease: Secondary | ICD-10-CM | POA: Diagnosis not present

## 2019-09-23 IMAGING — CT CT HEART SCORING
2 series · 16 of 20 positions shown, 18 images · non-contrast
Comparison: 06/04/2005 chest radiograph.  01/15/2005 chest CT.

CLINICAL DATA: Risk stratification

EXAM:
Coronary Calcium Score
TECHNIQUE: The patient was scanned on a Siemens Somatom 64 slice scanner. Axial
non-contrast 3 mm slices were carried out through the heart. The
data set was analyzed on a dedicated work station and scored using
the Agatson method.

[Series 2: casc 3.0 i36f 2 bestdiast 72 % · axial · 0.33mm/px · z∈[-253,-148]mm · 8 of 45 slices shown, 10 images]
[im 5/45  vessel]
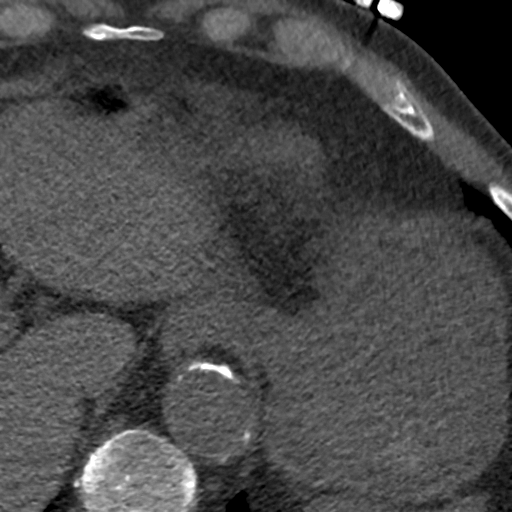
[im 5/45  lung]
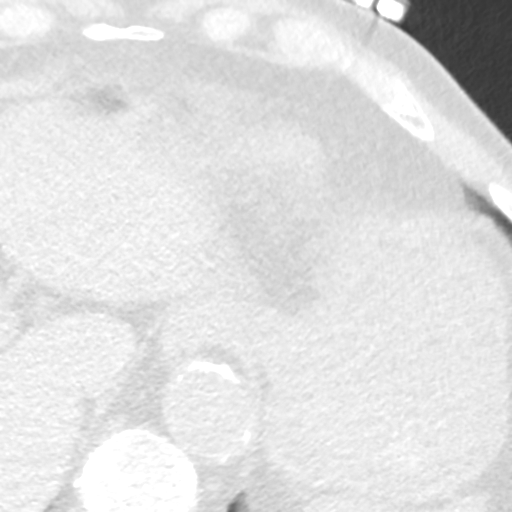
[im 10/45  vessel]
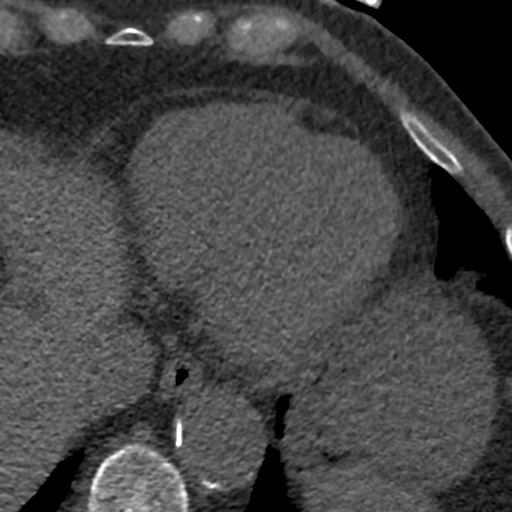
[im 15/45  vessel]
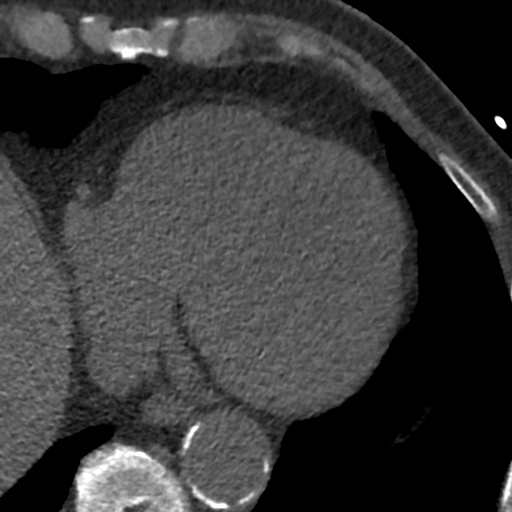
[im 20/45  vessel]
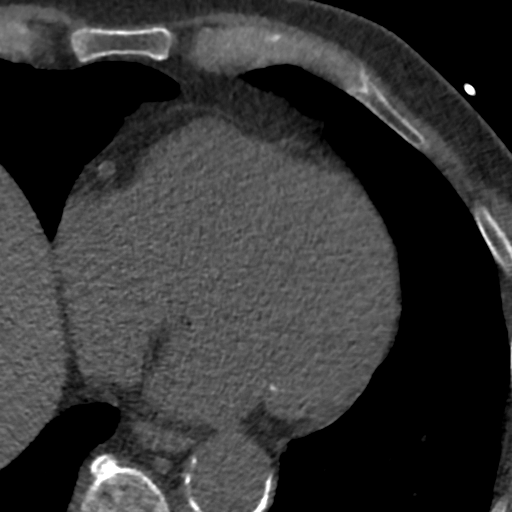
[im 25/45  vessel]
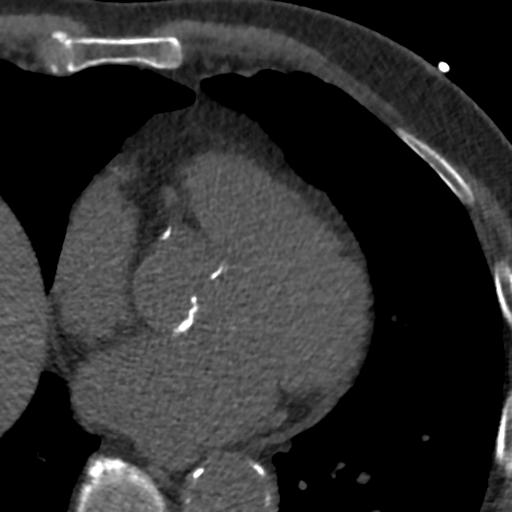
[im 25/45  lung]
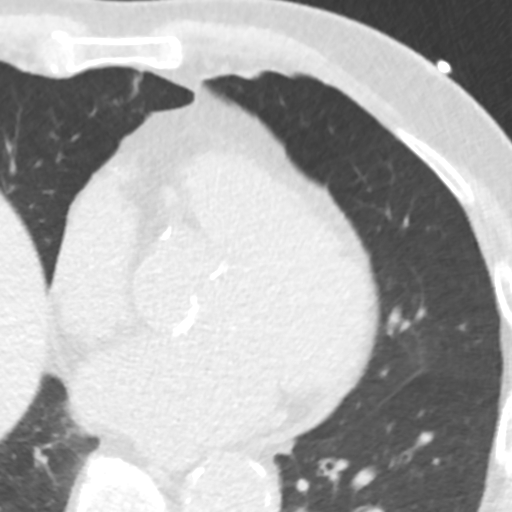
[im 30/45  vessel]
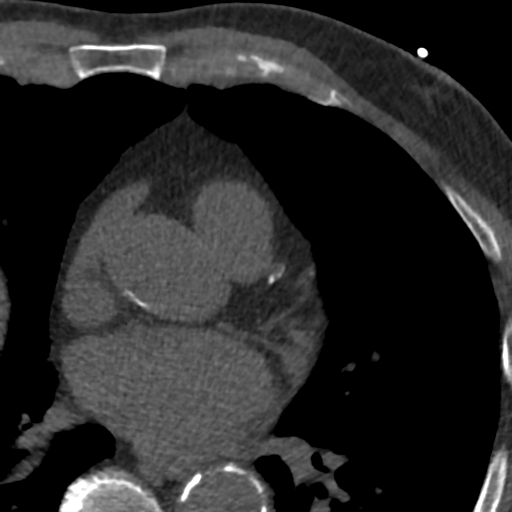
[im 35/45  vessel]
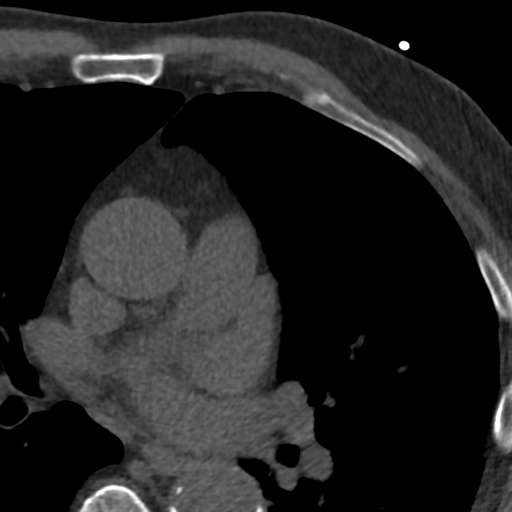
[im 40/45  vessel]
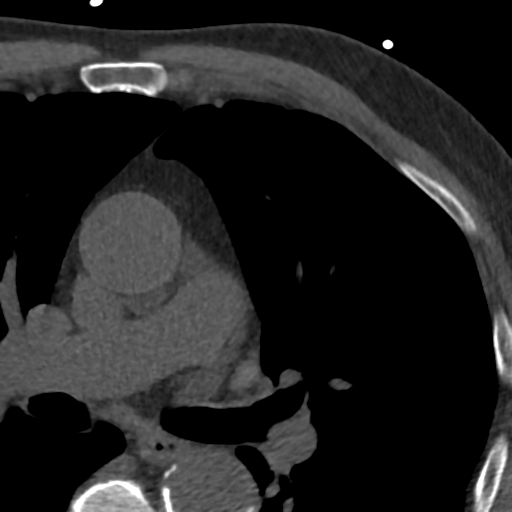

[Series 4: lung st 70 % · axial · 0.66mm/px · z∈[-252,-148]mm · 8 of 45 slices shown]
[im 5/45  lung]
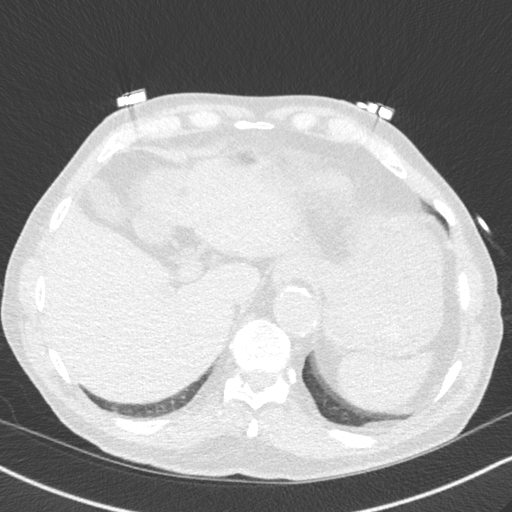
[im 10/45  lung]
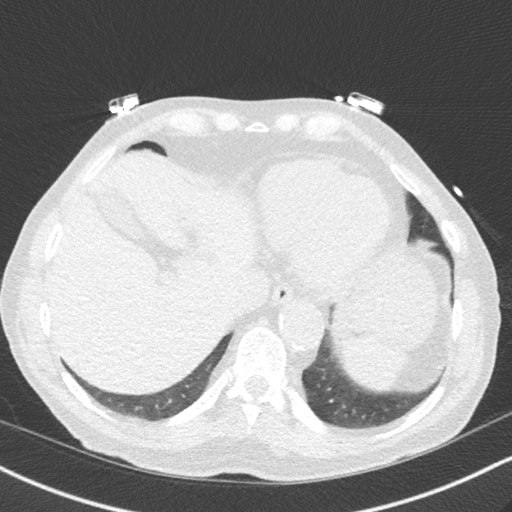
[im 15/45  lung]
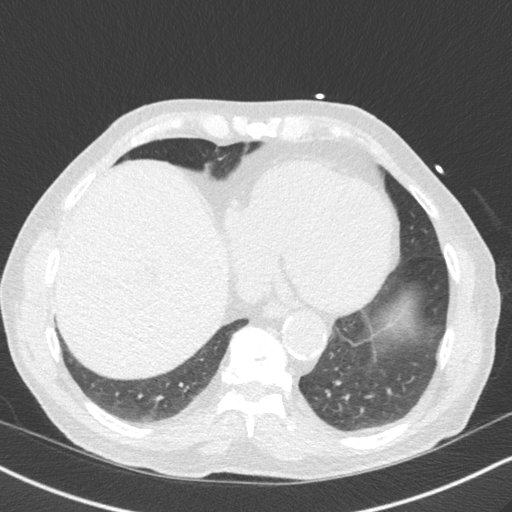
[im 20/45  lung]
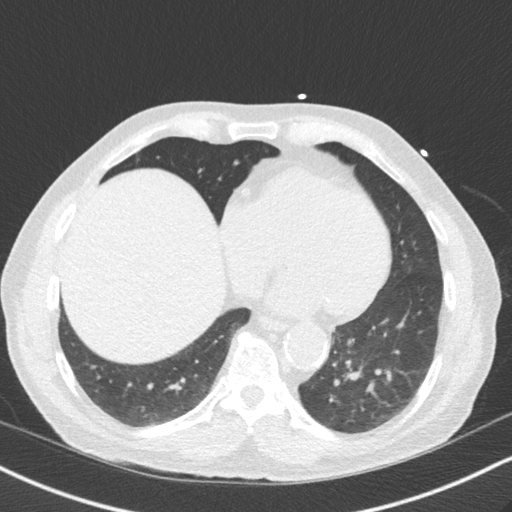
[im 25/45  lung]
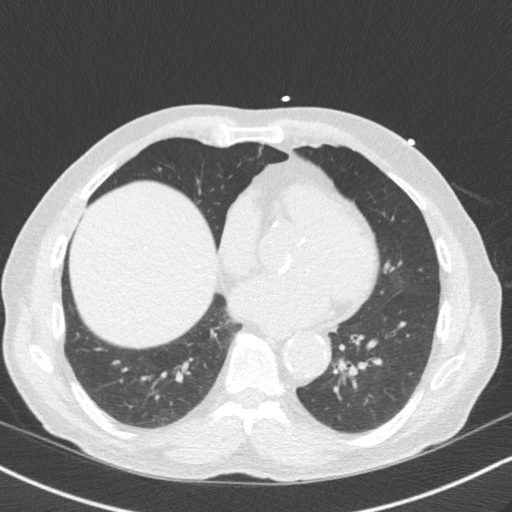
[im 30/45  lung]
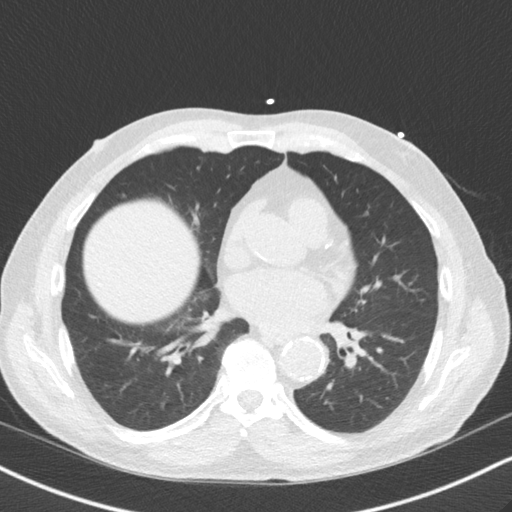
[im 35/45  lung]
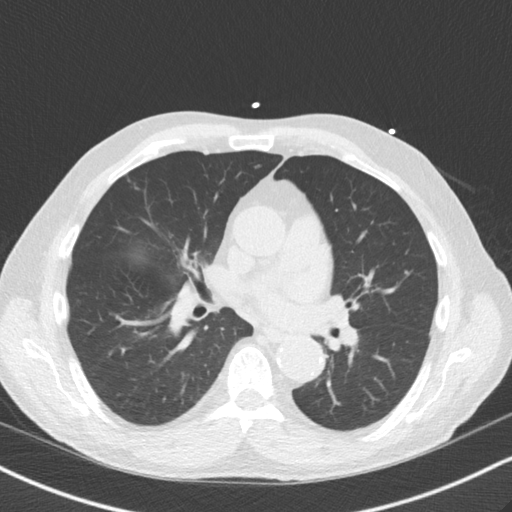
[im 40/45  lung]
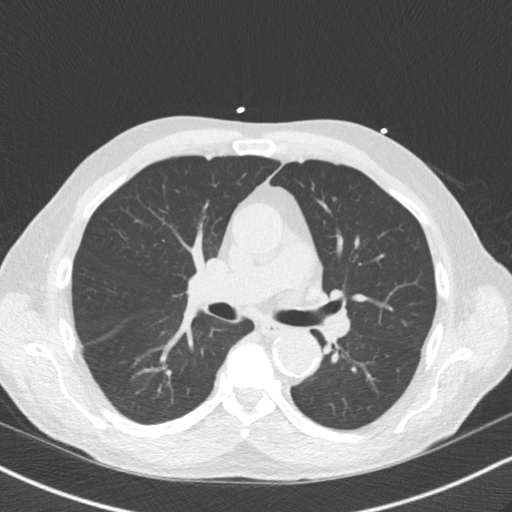

[16 of 20 positions shown; findings below may reference images not displayed]

FINDINGS: Non-cardiac: See separate report from [REDACTED].

Ascending Aorta: Calcific aortic atherosclerosis including the
aortic valve Diameter normal 3.4 cm

Pericardium: Normal

Coronary arteries: Calcium noted in LAD and RCA
IMPRESSION: Coronary calcium score of 318 . This was 39 th percentile for age
and sex matched control.

Saori Julian

EXAM:
OVER-READ INTERPRETATION  CT CHEST

The following report is an over-read performed by radiologist Dr.
Yess Werner [REDACTED] on 06/25/2017. This over-read
does not include interpretation of cardiac or coronary anatomy or
pathology. The coronary calcium interpretation by the cardiologist
is attached.
FINDINGS: Vascular: Advanced aortic atherosclerosis.

Mediastinum/Nodes: No imaged thoracic adenopathy.

Lungs/Pleura: No pleural fluid. Clustered right middle lobe
nodularity including on image 14/series 3 is likely post infectious
or inflammatory.

Upper Abdomen: Normal imaged portions of the liver, spleen, stomach,
gallbladder, right adrenal gland.

Musculoskeletal: Mild lower thoracic spondylosis. Moderate right
hemidiaphragm elevation.
IMPRESSION: 1.  No acute findings in the imaged extracardiac chest.
2. Clustered right middle lobe nodules which are likely post
infectious or inflammatory.
3.  Aortic Atherosclerosis ([id number]).

## 2019-10-11 DIAGNOSIS — Z992 Dependence on renal dialysis: Secondary | ICD-10-CM | POA: Diagnosis not present

## 2019-10-11 DIAGNOSIS — N186 End stage renal disease: Secondary | ICD-10-CM | POA: Diagnosis not present

## 2019-10-12 DIAGNOSIS — Z23 Encounter for immunization: Secondary | ICD-10-CM | POA: Diagnosis not present

## 2019-10-12 DIAGNOSIS — E1122 Type 2 diabetes mellitus with diabetic chronic kidney disease: Secondary | ICD-10-CM | POA: Diagnosis not present

## 2019-10-12 DIAGNOSIS — D631 Anemia in chronic kidney disease: Secondary | ICD-10-CM | POA: Diagnosis not present

## 2019-10-12 DIAGNOSIS — Z992 Dependence on renal dialysis: Secondary | ICD-10-CM | POA: Diagnosis not present

## 2019-10-12 DIAGNOSIS — N186 End stage renal disease: Secondary | ICD-10-CM | POA: Diagnosis not present

## 2019-10-12 DIAGNOSIS — N2581 Secondary hyperparathyroidism of renal origin: Secondary | ICD-10-CM | POA: Diagnosis not present

## 2019-10-25 DIAGNOSIS — H35341 Macular cyst, hole, or pseudohole, right eye: Secondary | ICD-10-CM | POA: Diagnosis not present

## 2019-11-03 DIAGNOSIS — Z79899 Other long term (current) drug therapy: Secondary | ICD-10-CM | POA: Diagnosis not present

## 2019-11-03 DIAGNOSIS — E7800 Pure hypercholesterolemia, unspecified: Secondary | ICD-10-CM | POA: Diagnosis not present

## 2019-11-03 DIAGNOSIS — E1142 Type 2 diabetes mellitus with diabetic polyneuropathy: Secondary | ICD-10-CM | POA: Diagnosis not present

## 2019-11-03 DIAGNOSIS — I10 Essential (primary) hypertension: Secondary | ICD-10-CM | POA: Diagnosis not present

## 2019-11-08 DIAGNOSIS — H903 Sensorineural hearing loss, bilateral: Secondary | ICD-10-CM | POA: Diagnosis not present

## 2019-11-10 DIAGNOSIS — Z8739 Personal history of other diseases of the musculoskeletal system and connective tissue: Secondary | ICD-10-CM | POA: Diagnosis not present

## 2019-11-10 DIAGNOSIS — I10 Essential (primary) hypertension: Secondary | ICD-10-CM | POA: Diagnosis not present

## 2019-11-10 DIAGNOSIS — N186 End stage renal disease: Secondary | ICD-10-CM | POA: Insufficient documentation

## 2019-11-10 DIAGNOSIS — Z992 Dependence on renal dialysis: Secondary | ICD-10-CM | POA: Insufficient documentation

## 2019-11-10 DIAGNOSIS — E1159 Type 2 diabetes mellitus with other circulatory complications: Secondary | ICD-10-CM | POA: Diagnosis not present

## 2019-11-10 DIAGNOSIS — E7800 Pure hypercholesterolemia, unspecified: Secondary | ICD-10-CM | POA: Diagnosis not present

## 2019-11-10 DIAGNOSIS — E1122 Type 2 diabetes mellitus with diabetic chronic kidney disease: Secondary | ICD-10-CM | POA: Diagnosis not present

## 2019-11-10 DIAGNOSIS — E1142 Type 2 diabetes mellitus with diabetic polyneuropathy: Secondary | ICD-10-CM | POA: Diagnosis not present

## 2019-11-11 DIAGNOSIS — E1122 Type 2 diabetes mellitus with diabetic chronic kidney disease: Secondary | ICD-10-CM | POA: Diagnosis not present

## 2019-11-11 DIAGNOSIS — Z23 Encounter for immunization: Secondary | ICD-10-CM | POA: Diagnosis not present

## 2019-11-11 DIAGNOSIS — N186 End stage renal disease: Secondary | ICD-10-CM | POA: Diagnosis not present

## 2019-11-11 DIAGNOSIS — Z992 Dependence on renal dialysis: Secondary | ICD-10-CM | POA: Diagnosis not present

## 2019-11-11 DIAGNOSIS — D509 Iron deficiency anemia, unspecified: Secondary | ICD-10-CM | POA: Diagnosis not present

## 2019-11-11 DIAGNOSIS — D631 Anemia in chronic kidney disease: Secondary | ICD-10-CM | POA: Diagnosis not present

## 2019-11-11 DIAGNOSIS — N2581 Secondary hyperparathyroidism of renal origin: Secondary | ICD-10-CM | POA: Diagnosis not present

## 2019-11-24 DIAGNOSIS — H16223 Keratoconjunctivitis sicca, not specified as Sjogren's, bilateral: Secondary | ICD-10-CM | POA: Diagnosis not present

## 2019-12-11 DIAGNOSIS — N186 End stage renal disease: Secondary | ICD-10-CM | POA: Diagnosis not present

## 2019-12-11 DIAGNOSIS — Z992 Dependence on renal dialysis: Secondary | ICD-10-CM | POA: Diagnosis not present

## 2019-12-14 DIAGNOSIS — Z23 Encounter for immunization: Secondary | ICD-10-CM | POA: Diagnosis not present

## 2019-12-14 DIAGNOSIS — D509 Iron deficiency anemia, unspecified: Secondary | ICD-10-CM | POA: Diagnosis not present

## 2019-12-14 DIAGNOSIS — N2581 Secondary hyperparathyroidism of renal origin: Secondary | ICD-10-CM | POA: Diagnosis not present

## 2019-12-14 DIAGNOSIS — Z992 Dependence on renal dialysis: Secondary | ICD-10-CM | POA: Diagnosis not present

## 2019-12-14 DIAGNOSIS — N186 End stage renal disease: Secondary | ICD-10-CM | POA: Diagnosis not present

## 2019-12-14 DIAGNOSIS — D631 Anemia in chronic kidney disease: Secondary | ICD-10-CM | POA: Diagnosis not present

## 2020-01-11 DIAGNOSIS — N186 End stage renal disease: Secondary | ICD-10-CM | POA: Diagnosis not present

## 2020-01-11 DIAGNOSIS — Z992 Dependence on renal dialysis: Secondary | ICD-10-CM | POA: Diagnosis not present

## 2020-01-13 DIAGNOSIS — N186 End stage renal disease: Secondary | ICD-10-CM | POA: Diagnosis not present

## 2020-01-13 DIAGNOSIS — Z992 Dependence on renal dialysis: Secondary | ICD-10-CM | POA: Diagnosis not present

## 2020-01-13 DIAGNOSIS — E1122 Type 2 diabetes mellitus with diabetic chronic kidney disease: Secondary | ICD-10-CM | POA: Diagnosis not present

## 2020-01-13 DIAGNOSIS — N2581 Secondary hyperparathyroidism of renal origin: Secondary | ICD-10-CM | POA: Diagnosis not present

## 2020-01-13 DIAGNOSIS — D509 Iron deficiency anemia, unspecified: Secondary | ICD-10-CM | POA: Diagnosis not present

## 2020-01-13 DIAGNOSIS — D631 Anemia in chronic kidney disease: Secondary | ICD-10-CM | POA: Diagnosis not present

## 2020-01-28 ENCOUNTER — Other Ambulatory Visit (INDEPENDENT_AMBULATORY_CARE_PROVIDER_SITE_OTHER): Payer: Self-pay | Admitting: Nurse Practitioner

## 2020-01-28 DIAGNOSIS — I701 Atherosclerosis of renal artery: Secondary | ICD-10-CM

## 2020-02-10 DIAGNOSIS — N186 End stage renal disease: Secondary | ICD-10-CM | POA: Diagnosis not present

## 2020-02-10 DIAGNOSIS — Z992 Dependence on renal dialysis: Secondary | ICD-10-CM | POA: Diagnosis not present

## 2020-02-12 DIAGNOSIS — N186 End stage renal disease: Secondary | ICD-10-CM | POA: Diagnosis not present

## 2020-02-12 DIAGNOSIS — Z23 Encounter for immunization: Secondary | ICD-10-CM | POA: Diagnosis not present

## 2020-02-12 DIAGNOSIS — Z992 Dependence on renal dialysis: Secondary | ICD-10-CM | POA: Diagnosis not present

## 2020-02-12 DIAGNOSIS — D509 Iron deficiency anemia, unspecified: Secondary | ICD-10-CM | POA: Diagnosis not present

## 2020-02-12 DIAGNOSIS — N2581 Secondary hyperparathyroidism of renal origin: Secondary | ICD-10-CM | POA: Diagnosis not present

## 2020-02-21 ENCOUNTER — Ambulatory Visit (INDEPENDENT_AMBULATORY_CARE_PROVIDER_SITE_OTHER): Payer: PPO | Admitting: Vascular Surgery

## 2020-02-21 ENCOUNTER — Encounter (INDEPENDENT_AMBULATORY_CARE_PROVIDER_SITE_OTHER): Payer: PPO

## 2020-02-25 ENCOUNTER — Other Ambulatory Visit: Payer: Self-pay

## 2020-02-25 ENCOUNTER — Inpatient Hospital Stay: Payer: PPO | Attending: Oncology

## 2020-02-25 DIAGNOSIS — Z85038 Personal history of other malignant neoplasm of large intestine: Secondary | ICD-10-CM | POA: Insufficient documentation

## 2020-02-25 DIAGNOSIS — E1122 Type 2 diabetes mellitus with diabetic chronic kidney disease: Secondary | ICD-10-CM | POA: Insufficient documentation

## 2020-02-25 DIAGNOSIS — Z79899 Other long term (current) drug therapy: Secondary | ICD-10-CM | POA: Diagnosis not present

## 2020-02-25 DIAGNOSIS — D472 Monoclonal gammopathy: Secondary | ICD-10-CM | POA: Diagnosis not present

## 2020-02-25 DIAGNOSIS — Z794 Long term (current) use of insulin: Secondary | ICD-10-CM | POA: Insufficient documentation

## 2020-02-25 DIAGNOSIS — D631 Anemia in chronic kidney disease: Secondary | ICD-10-CM | POA: Diagnosis not present

## 2020-02-25 DIAGNOSIS — I120 Hypertensive chronic kidney disease with stage 5 chronic kidney disease or end stage renal disease: Secondary | ICD-10-CM | POA: Insufficient documentation

## 2020-02-25 DIAGNOSIS — N185 Chronic kidney disease, stage 5: Secondary | ICD-10-CM | POA: Diagnosis not present

## 2020-02-25 LAB — CBC WITH DIFFERENTIAL/PLATELET
Abs Immature Granulocytes: 0.02 10*3/uL (ref 0.00–0.07)
Basophils Absolute: 0 10*3/uL (ref 0.0–0.1)
Basophils Relative: 0 %
Eosinophils Absolute: 0.2 10*3/uL (ref 0.0–0.5)
Eosinophils Relative: 3 %
HCT: 36.1 % — ABNORMAL LOW (ref 39.0–52.0)
Hemoglobin: 11.9 g/dL — ABNORMAL LOW (ref 13.0–17.0)
Immature Granulocytes: 0 %
Lymphocytes Relative: 19 %
Lymphs Abs: 0.9 10*3/uL (ref 0.7–4.0)
MCH: 33 pg (ref 26.0–34.0)
MCHC: 33 g/dL (ref 30.0–36.0)
MCV: 100 fL (ref 80.0–100.0)
Monocytes Absolute: 0.5 10*3/uL (ref 0.1–1.0)
Monocytes Relative: 11 %
Neutro Abs: 3.2 10*3/uL (ref 1.7–7.7)
Neutrophils Relative %: 67 %
Platelets: 169 10*3/uL (ref 150–400)
RBC: 3.61 MIL/uL — ABNORMAL LOW (ref 4.22–5.81)
RDW: 13.3 % (ref 11.5–15.5)
WBC: 4.9 10*3/uL (ref 4.0–10.5)
nRBC: 0 % (ref 0.0–0.2)

## 2020-02-25 LAB — COMPREHENSIVE METABOLIC PANEL
ALT: 15 U/L (ref 0–44)
AST: 21 U/L (ref 15–41)
Albumin: 4.1 g/dL (ref 3.5–5.0)
Alkaline Phosphatase: 57 U/L (ref 38–126)
Anion gap: 15 (ref 5–15)
BUN: 41 mg/dL — ABNORMAL HIGH (ref 8–23)
CO2: 30 mmol/L (ref 22–32)
Calcium: 8.7 mg/dL — ABNORMAL LOW (ref 8.9–10.3)
Chloride: 92 mmol/L — ABNORMAL LOW (ref 98–111)
Creatinine, Ser: 5.36 mg/dL — ABNORMAL HIGH (ref 0.61–1.24)
GFR, Estimated: 9 mL/min — ABNORMAL LOW (ref >60)
Glucose, Bld: 189 mg/dL — ABNORMAL HIGH (ref 70–99)
Potassium: 4 mmol/L (ref 3.5–5.1)
Sodium: 137 mmol/L (ref 135–145)
Total Bilirubin: 0.9 mg/dL (ref 0.3–1.2)
Total Protein: 7.3 g/dL (ref 6.5–8.1)

## 2020-02-26 LAB — CEA: CEA: 1.3 ng/mL (ref 0.0–4.7)

## 2020-02-28 ENCOUNTER — Other Ambulatory Visit (INDEPENDENT_AMBULATORY_CARE_PROVIDER_SITE_OTHER): Payer: Self-pay | Admitting: Nurse Practitioner

## 2020-02-28 DIAGNOSIS — N186 End stage renal disease: Secondary | ICD-10-CM

## 2020-02-28 LAB — MULTIPLE MYELOMA PANEL, SERUM
Albumin SerPl Elph-Mcnc: 3.6 g/dL (ref 2.9–4.4)
Albumin/Glob SerPl: 1.4 (ref 0.7–1.7)
Alpha 1: 0.2 g/dL (ref 0.0–0.4)
Alpha2 Glob SerPl Elph-Mcnc: 0.8 g/dL (ref 0.4–1.0)
B-Globulin SerPl Elph-Mcnc: 0.8 g/dL (ref 0.7–1.3)
Gamma Glob SerPl Elph-Mcnc: 0.8 g/dL (ref 0.4–1.8)
Globulin, Total: 2.7 g/dL (ref 2.2–3.9)
IgA: 324 mg/dL (ref 61–437)
IgG (Immunoglobin G), Serum: 813 mg/dL (ref 603–1613)
IgM (Immunoglobulin M), Srm: 81 mg/dL (ref 15–143)
M Protein SerPl Elph-Mcnc: 0.3 g/dL — ABNORMAL HIGH
Total Protein ELP: 6.3 g/dL (ref 6.0–8.5)

## 2020-02-28 LAB — KAPPA/LAMBDA LIGHT CHAINS
Kappa free light chain: 247.3 mg/L — ABNORMAL HIGH (ref 3.3–19.4)
Kappa, lambda light chain ratio: 4.79 — ABNORMAL HIGH (ref 0.26–1.65)
Lambda free light chains: 51.6 mg/L — ABNORMAL HIGH (ref 5.7–26.3)

## 2020-03-01 ENCOUNTER — Ambulatory Visit (INDEPENDENT_AMBULATORY_CARE_PROVIDER_SITE_OTHER): Payer: PPO

## 2020-03-01 ENCOUNTER — Encounter (INDEPENDENT_AMBULATORY_CARE_PROVIDER_SITE_OTHER): Payer: Self-pay | Admitting: Nurse Practitioner

## 2020-03-01 ENCOUNTER — Ambulatory Visit (INDEPENDENT_AMBULATORY_CARE_PROVIDER_SITE_OTHER): Payer: PPO | Admitting: Nurse Practitioner

## 2020-03-01 ENCOUNTER — Other Ambulatory Visit: Payer: Self-pay

## 2020-03-01 VITALS — BP 149/58 | HR 66 | Ht 68.0 in | Wt 144.0 lb

## 2020-03-01 DIAGNOSIS — Z992 Dependence on renal dialysis: Secondary | ICD-10-CM

## 2020-03-01 DIAGNOSIS — I10 Essential (primary) hypertension: Secondary | ICD-10-CM | POA: Diagnosis not present

## 2020-03-01 DIAGNOSIS — N186 End stage renal disease: Secondary | ICD-10-CM | POA: Diagnosis not present

## 2020-03-01 DIAGNOSIS — I700 Atherosclerosis of aorta: Secondary | ICD-10-CM | POA: Insufficient documentation

## 2020-03-01 DIAGNOSIS — E7800 Pure hypercholesterolemia, unspecified: Secondary | ICD-10-CM

## 2020-03-01 NOTE — Progress Notes (Signed)
Subjective:    Patient ID: Dylan Sanders, male    DOB: Dec 08, 1931, 86 y.o.   MRN: [id number] Chief Complaint  Patient presents with  . Follow-up    6 mo U/s follow up    The patient returns to the office for followup of their dialysis access. The function of the access has been stable. The patient denies increased bleeding time or increased recirculation. Patient denies difficulty with cannulation. The patient denies hand pain or other symptoms consistent with steal phenomena.  No significant arm swelling.  The patient denies redness or swelling at the access site. The patient denies fever or chills at home or while on dialysis.  The patient denies amaurosis fugax or recent TIA symptoms. There are no recent neurological changes noted. The patient denies claudication symptoms or rest pain symptoms. The patient denies history of DVT, PE or superficial thrombophlebitis. The patient denies recent episodes of angina or shortness of breath.     The patient has a flow volume of 1896.  The velocity is increased from 1717.  The patient does have evidence of increased velocities seen near the arterial anastomosis however this is more so related to the angle of takeoff versus a flow-limiting stenosis.  The studies are unchanged from 6 months previous.  The previously placed stents are patent.    Review of Systems  Neurological: Positive for numbness.  All other systems reviewed and are negative.      Objective:   Physical Exam Vitals reviewed.  HENT:     Head: Normocephalic.  Cardiovascular:     Rate and Rhythm: Normal rate and regular rhythm.     Pulses: Normal pulses.          Radial pulses are 2+ on the left side.     Arteriovenous access: left arteriovenous access is present.    Comments: Left brachial axillary AV graft with good thrill and bruit Pulmonary:     Effort: Pulmonary effort is normal.  Skin:    General: Skin is warm and dry.  Neurological:     Mental Status: He  is alert and oriented to person, place, and time.  Psychiatric:        Mood and Affect: Mood normal.        Behavior: Behavior normal.        Thought Content: Thought content normal.        Judgment: Judgment normal.     BP (!) 149/58   Pulse 66   Ht 5\' 8"  (1.727 m)   Wt 144 lb (65.3 kg)   BMI 21.90 kg/m   Past Medical History:  Diagnosis Date  . Anemia   . Cancer [facility]) 2008   colon  . Chronic kidney disease   . Diabetes mellitus without complication ([place])   . Heart murmur    asymptomatic  . Hypertension     Social History   Socioeconomic History  . Marital status: Widowed    Spouse name: Not on file  . Number of children: Not on file  . Years of education: Not on file  . Highest education level: Not on file  Occupational History  . Not on file  Tobacco Use  . Smoking status: Former Smoker    Years: 25.00    Types: Pipe, Cigarettes, Cigars    Quit date: 09/05/1976    Years since quitting: 43.5  . Smokeless tobacco: Never Used  Vaping Use  . Vaping Use: Never used  Substance and Sexual Activity  .  Alcohol use: Yes    Comment: ocassionally  . Drug use: No  . Sexual activity: Never  Other Topics Concern  . Not on file  Social History Narrative  . Not on file   Social Determinants of Health   Financial Resource Strain:   . Difficulty of Paying Living Expenses: Not on file  Food Insecurity:   . Worried About Charity fundraiser in the Last Year: Not on file  . Ran Out of Food in the Last Year: Not on file  Transportation Needs:   . Lack of Transportation (Medical): Not on file  . Lack of Transportation (Non-Medical): Not on file  Physical Activity:   . Days of Exercise per Week: Not on file  . Minutes of Exercise per Session: Not on file  Stress:   . Feeling of Stress : Not on file  Social Connections:   . Frequency of Communication with Friends and Family: Not on file  . Frequency of Social Gatherings with Friends and Family: Not on file  .  Attends Religious Services: Not on file  . Active Member of Clubs or Organizations: Not on file  . Attends Archivist Meetings: Not on file  . Marital Status: Not on file  Intimate Partner Violence:   . Fear of Current or Ex-Partner: Not on file  . Emotionally Abused: Not on file  . Physically Abused: Not on file  . Sexually Abused: Not on file    Past Surgical History:  Procedure Laterality Date  . A/V SHUNT INTERVENTION Left 07/27/2019   Procedure: A/V SHUNT INTERVENTION;  Surgeon: Katha Cabal, MD;  Location: [place] [facility];  Service: Cardiovascular;  Laterality: Left;  . AV FISTULA PLACEMENT Left 01/08/2019   Procedure: INSERTION OF ARTERIOVENOUS (AV) GORE-TEX GRAFT ARM ( BRACHIAL AXILLARY );  Surgeon: Katha Cabal, MD;  Location: [facility];  Service: Vascular;  Laterality: Left;  . COLON SURGERY    . EYE SURGERY     bilateral  . RENAL ANGIOGRAPHY Bilateral 11/19/2016   Procedure: Renal Angiography;  Surgeon: Katha Cabal, MD;  Location: [place] [facility];  Service: Cardiovascular;  Laterality: Bilateral;  . RENAL ANGIOGRAPHY Right 07/03/2018   Procedure: RENAL ANGIOGRAPHY;  Surgeon: Katha Cabal, MD;  Location: [place] [facility];  Service: Cardiovascular;  Laterality: Right;  . REVERSE SHOULDER ARTHROPLASTY Left 09/19/2016   Procedure: REVERSE SHOULDER ARTHROPLASTY;  Surgeon: Corky Mull, MD;  Location: [facility];  Service: Orthopedics;  Laterality: Left;  . ROTATOR CUFF REPAIR Right     Family History  Problem Relation Age of Onset  . Cancer Father     Allergies  Allergen Reactions  . Ace Inhibitors Other (See Comments)       Assessment & Plan:   1. ESRD on hemodialysis [name]) Recommend:  The patient is doing well and currently has adequate dialysis access. The patient's dialysis center is not reporting any access issues. Flow pattern is stable when compared to the prior ultrasound.  The patient does have evidence of  increased velocities seen near the arterial anastomosis however this is more so related to the angle of takeoff versus a flow-limiting stenosis.  The studies are unchanged from 6 months previous.  The patient should have a duplex ultrasound of the dialysis access in 6 months. The patient will follow-up with me in the office after each ultrasound     2. Hypertension, essential Continue antihypertensive medications as already ordered, these medications have been reviewed  and there are no changes at this time.   3. Pure hypercholesterolemia Continue statin as ordered and reviewed, no changes at this time    Current Outpatient Medications on File Prior to Visit  Medication Sig Dispense Refill  . allopurinol (ZYLOPRIM) 100 MG tablet Take 100 mg by mouth at bedtime.     Marland Kitchen amLODipine (NORVASC) 10 MG tablet Take 10 mg by mouth every morning.     Marland Kitchen aspirin EC 81 MG tablet Take 81 mg by mouth at bedtime.     . B Complex-C-Folic Acid (RENA-VITE RX) 1 MG TABS Take 1 tablet by mouth daily.    . carvedilol (COREG) 3.125 MG tablet Take 1 tablet (3.125 mg total) by mouth 2 (two) times daily with a meal. 180 tablet 3  . cloNIDine (CATAPRES) 0.1 MG tablet Take 0.1 mg by mouth 2 (two) times daily.     . clopidogrel (PLAVIX) 75 MG tablet Take 1 tablet by mouth once daily 90 tablet 0  . ferrous sulfate 325 (65 FE) MG EC tablet Take 1 tablet (325 mg total) by mouth 2 (two) times daily. 90 tablet 1  . LANTUS SOLOSTAR 100 UNIT/ML Solostar Pen Inject 10 Units into the skin every morning.     Marland Kitchen losartan (COZAAR) 100 MG tablet Take by mouth.    . psyllium (METAMUCIL) 58.6 % powder Take 1 packet by mouth daily.    . sevelamer carbonate (RENVELA) 800 MG tablet Take 800 mg by mouth 3 (three) times daily.    . simvastatin (ZOCOR) 20 MG tablet Take 20 mg by mouth at bedtime.    . vitamin B-12 (CYANOCOBALAMIN) 1000 MCG tablet Take 1 tablet (1,000 mcg total) by mouth daily. 90 tablet 0  . vitamin C (ASCORBIC ACID) 500  MG tablet Take 1 tablet (500 mg total) by mouth daily. 90 tablet 0  . telmisartan (MICARDIS) 80 MG tablet Take 80 mg by mouth every morning.      No current facility-administered medications on file prior to visit.    There are no Patient Instructions on file for this visit. No follow-ups on file.   Kris Hartmann, NP

## 2020-03-10 ENCOUNTER — Inpatient Hospital Stay: Payer: PPO | Admitting: Oncology

## 2020-03-10 ENCOUNTER — Other Ambulatory Visit: Payer: Self-pay

## 2020-03-10 ENCOUNTER — Encounter: Payer: Self-pay | Admitting: Oncology

## 2020-03-10 VITALS — BP 128/67 | HR 65 | Temp 95.7°F | Resp 16 | Wt 147.1 lb

## 2020-03-10 DIAGNOSIS — N185 Chronic kidney disease, stage 5: Secondary | ICD-10-CM | POA: Diagnosis not present

## 2020-03-10 DIAGNOSIS — D631 Anemia in chronic kidney disease: Secondary | ICD-10-CM

## 2020-03-10 DIAGNOSIS — D472 Monoclonal gammopathy: Secondary | ICD-10-CM

## 2020-03-10 DIAGNOSIS — I120 Hypertensive chronic kidney disease with stage 5 chronic kidney disease or end stage renal disease: Secondary | ICD-10-CM | POA: Diagnosis not present

## 2020-03-10 DIAGNOSIS — Z85038 Personal history of other malignant neoplasm of large intestine: Secondary | ICD-10-CM | POA: Diagnosis not present

## 2020-03-10 NOTE — Progress Notes (Signed)
Patient denies new problems/concerns today.   °

## 2020-03-10 NOTE — Progress Notes (Signed)
Hematology/Oncology follow up note [facility] [phone] [phone]   Patient Care Team: [name], MD as PCP - General (Internal Medicine)  REFERRING PROVIDER: [name], MD  CHIEF COMPLAINTS/REASON FOR VISIT:  Follow-up for anemia in chronic kidney disease.  HISTORY OF PRESENTING ILLNESS:   Dylan Sanders is a  86 y.o.  male with PMH listed below was seen in consultation at the request of  [name], MD  for evaluation of anemia and chronic kidney disease. Patient has chronic kidney disease, secondary to longstanding high blood pressure.  Follows up with nephrology. He has had AV graft placed and is matured.  Last seen by nephrology on 02/23/2019, no indication to start dialysis yet. 02/11/2019, hemoglobin 8.5, hematocrit 26.3, MCV 102.3, Creatinine 3.86, EGFR 15. Negative hepatitis panel Patient was referred to heme-onc for evaluation of anemia in the setting of chronic kidney disease. Patient reports feeling well.  Chronic fatigue at baseline. Appetite is fair. Denies weight loss, fever, chills, fatigue, night sweats.  #Reported remote history of colon cancer 2008.  Status post resection. #Diarrhea, chronic.  No blood in the stool.  INTERVAL HISTORY Dylan Sanders is a 86 y.o. male who has above history reviewed by me today presents for follow up visit for management of anemia and chronic disease. Problems and complaints are listed below:  He reports feeling great. On HD with IV iron and EPO treatment managed by nephrology. No new complaints.   Review of Systems  Constitutional: Positive for fatigue. Negative for appetite change, chills, fever and unexpected weight change.  HENT:   Negative for hearing loss and voice change.   Eyes: Negative for eye problems and icterus.  Respiratory: Negative for chest tightness, cough and shortness of breath.   Cardiovascular: Negative for chest pain and leg swelling.    Gastrointestinal: Negative for abdominal distention and abdominal pain.  Endocrine: Negative for hot flashes.  Genitourinary: Negative for difficulty urinating, dysuria and frequency.   Musculoskeletal: Negative for arthralgias.  Skin: Negative for itching and rash.  Neurological: Negative for light-headedness and numbness.  Hematological: Negative for adenopathy. Does not bruise/bleed easily.  Psychiatric/Behavioral: Negative for confusion.    MEDICAL HISTORY:  Past Medical History:  Diagnosis Date  . Anemia   . Cancer [facility]) 2008   colon  . Chronic kidney disease   . Diabetes mellitus without complication ([place])   . Heart murmur    asymptomatic  . Hypertension     SURGICAL HISTORY: Past Surgical History:  Procedure Laterality Date  . A/V SHUNT INTERVENTION Left 07/27/2019   Procedure: A/V SHUNT INTERVENTION;  Surgeon: Katha Cabal, MD;  Location: [place] [facility];  Service: Cardiovascular;  Laterality: Left;  . AV FISTULA PLACEMENT Left 01/08/2019   Procedure: INSERTION OF ARTERIOVENOUS (AV) GORE-TEX GRAFT ARM ( BRACHIAL AXILLARY );  Surgeon: Katha Cabal, MD;  Location: [facility] ORS;  Service: Vascular;  Laterality: Left;  . COLON SURGERY    . EYE SURGERY     bilateral  . RENAL ANGIOGRAPHY Bilateral 11/19/2016   Procedure: Renal Angiography;  Surgeon: Katha Cabal, MD;  Location: [place] [facility];  Service: Cardiovascular;  Laterality: Bilateral;  . RENAL ANGIOGRAPHY Right 07/03/2018   Procedure: RENAL ANGIOGRAPHY;  Surgeon: Katha Cabal, MD;  Location: [place] [facility];  Service: Cardiovascular;  Laterality: Right;  . REVERSE SHOULDER ARTHROPLASTY Left 09/19/2016   Procedure: REVERSE SHOULDER ARTHROPLASTY;  Surgeon: Corky Mull, MD;  Location: [facility]  ORS;  Service: Orthopedics;  Laterality: Left;  . ROTATOR CUFF REPAIR Right     SOCIAL HISTORY: Social History   Socioeconomic History  . Marital status: Widowed    Spouse name: Not on file   . Number of children: Not on file  . Years of education: Not on file  . Highest education level: Not on file  Occupational History  . Not on file  Tobacco Use  . Smoking status: Former Smoker    Years: 25.00    Types: Pipe, Cigarettes, Cigars    Quit date: 09/05/1976    Years since quitting: 43.5  . Smokeless tobacco: Never Used  Vaping Use  . Vaping Use: Never used  Substance and Sexual Activity  . Alcohol use: Yes    Comment: ocassionally  . Drug use: No  . Sexual activity: Never  Other Topics Concern  . Not on file  Social History Narrative  . Not on file   Social Determinants of Health   Financial Resource Strain:   . Difficulty of Paying Living Expenses: Not on file  Food Insecurity:   . Worried About Charity fundraiser in the Last Year: Not on file  . Ran Out of Food in the Last Year: Not on file  Transportation Needs:   . Lack of Transportation (Medical): Not on file  . Lack of Transportation (Non-Medical): Not on file  Physical Activity:   . Days of Exercise per Week: Not on file  . Minutes of Exercise per Session: Not on file  Stress:   . Feeling of Stress : Not on file  Social Connections:   . Frequency of Communication with Friends and Family: Not on file  . Frequency of Social Gatherings with Friends and Family: Not on file  . Attends Religious Services: Not on file  . Active Member of Clubs or Organizations: Not on file  . Attends Archivist Meetings: Not on file  . Marital Status: Not on file  Intimate Partner Violence:   . Fear of Current or Ex-Partner: Not on file  . Emotionally Abused: Not on file  . Physically Abused: Not on file  . Sexually Abused: Not on file    FAMILY HISTORY: Family History  Problem Relation Age of Onset  . Cancer Father     ALLERGIES:  is allergic to ace inhibitors.  MEDICATIONS:  Current Outpatient Medications  Medication Sig Dispense Refill  . allopurinol (ZYLOPRIM) 100 MG tablet Take 100 mg by mouth  at bedtime.     Marland Kitchen amLODipine (NORVASC) 10 MG tablet Take 10 mg by mouth every morning.     Marland Kitchen aspirin EC 81 MG tablet Take 81 mg by mouth at bedtime.     . B Complex-C-Folic Acid (RENA-VITE RX) 1 MG TABS Take 1 tablet by mouth daily.    . carvedilol (COREG) 3.125 MG tablet Take 1 tablet (3.125 mg total) by mouth 2 (two) times daily with a meal. 180 tablet 3  . cloNIDine (CATAPRES) 0.1 MG tablet Take 0.1 mg by mouth 2 (two) times daily.     . clopidogrel (PLAVIX) 75 MG tablet Take 1 tablet by mouth once daily 90 tablet 0  . ferrous sulfate 325 (65 FE) MG EC tablet Take 1 tablet (325 mg total) by mouth 2 (two) times daily. 90 tablet 1  . LANTUS SOLOSTAR 100 UNIT/ML Solostar Pen Inject 10 Units into the skin every morning.     Marland Kitchen losartan (COZAAR) 100 MG tablet Take by mouth.    Marland Kitchen  psyllium (METAMUCIL) 58.6 % powder Take 1 packet by mouth daily.    . sevelamer carbonate (RENVELA) 800 MG tablet Take 800 mg by mouth 3 (three) times daily.    . simvastatin (ZOCOR) 20 MG tablet Take 20 mg by mouth at bedtime.    Marland Kitchen telmisartan (MICARDIS) 80 MG tablet Take 80 mg by mouth every morning.     . vitamin B-12 (CYANOCOBALAMIN) 1000 MCG tablet Take 1 tablet (1,000 mcg total) by mouth daily. 90 tablet 0  . vitamin C (ASCORBIC ACID) 500 MG tablet Take 1 tablet (500 mg total) by mouth daily. 90 tablet 0   No current facility-administered medications for this visit.     PHYSICAL EXAMINATION: ECOG PERFORMANCE STATUS: 1 - Symptomatic but completely ambulatory Vitals:   03/10/20 0951  BP: 128/67  Pulse: 65  Resp: 16  Temp: (!) 95.7 F (35.4 C)   Filed Weights   03/10/20 0951  Weight: 147 lb 1.6 oz (66.7 kg)    Physical Exam Constitutional:      General: He is not in acute distress. HENT:     Head: Normocephalic and atraumatic.  Eyes:     General: No scleral icterus. Cardiovascular:     Rate and Rhythm: Normal rate and regular rhythm.     Heart sounds: Murmur heard.   Pulmonary:     Effort:  Pulmonary effort is normal. No respiratory distress.     Breath sounds: No wheezing.  Abdominal:     General: Bowel sounds are normal. There is no distension.     Palpations: Abdomen is soft.  Musculoskeletal:        General: No deformity. Normal range of motion.     Cervical back: Normal range of motion and neck supple.  Skin:    General: Skin is warm and dry.     Findings: No erythema or rash.  Neurological:     Mental Status: He is alert and oriented to person, place, and time. Mental status is at baseline.     Cranial Nerves: No cranial nerve deficit.     Coordination: Coordination normal.  Psychiatric:        Mood and Affect: Mood normal.     LABORATORY DATA:  I have reviewed the data as listed Lab Results  Component Value Date   WBC 4.9 02/25/2020   HGB 11.9 (L) 02/25/2020   HCT 36.1 (L) 02/25/2020   MCV 100.0 02/25/2020   PLT 169 02/25/2020   Recent Labs    07/27/19 1531 02/25/20 1044  NA 143 137  K 3.6 4.0  CL 108 92*  CO2 24 30  GLUCOSE 74 189*  BUN 36* 41*  CREATININE 3.00* 5.36*  CALCIUM 8.6* 8.7*  GFRNONAA 18* 9*  GFRAA 21*  --   PROT  --  7.3  ALBUMIN  --  4.1  AST  --  21  ALT  --  15  ALKPHOS  --  57  BILITOT  --  0.9   Iron/TIBC/Ferritin/ %Sat    Component Value Date/Time   IRON 68 08/25/2019 1256   TIBC 225 (L) 08/25/2019 1256   FERRITIN 362 (H) 08/25/2019 1256   IRONPCTSAT 30 08/25/2019 1256      RADIOGRAPHIC STUDIES: I have personally reviewed the radiological images as listed and agreed with the findings in the report.  VAS US DUPLEX DIALYSIS ACCESS (AVF,AVG)  Result Date: 03/02/2020 DIALYSIS ACCESS Access Site: Left Upper Extremity. Access Type: Brachial Axillary AVG. History: 01/05/19: Left Brach-Ax AVG placement;  07/27/19: PTA/stent of AVG venous outflow;. Comparison Study: 08/20/2019 Performing Technologist: Almira Coaster RVS  Examination Guidelines: A complete evaluation includes B-mode imaging, spectral Doppler,  color Doppler, and power Doppler as needed of all accessible portions of each vessel. Unilateral testing is considered an integral part of a complete examination. Limited examinations for reoccurring indications may be performed as noted.  Findings:   +--------------------+----------+-----------------+---------------------+ AVG                 PSV (cm/s)Flow Vol (mL/min)      Describe        +--------------------+----------+-----------------+---------------------+ Native artery inflow   361          1896       .52cms lumen diameter +--------------------+----------+-----------------+---------------------+ Arterial anastomosis   775                     .36cms lumen diameter +--------------------+----------+-----------------+---------------------+ Prox graft             462                                           +--------------------+----------+-----------------+---------------------+ Mid graft              292                                           +--------------------+----------+-----------------+---------------------+ Distal graft           229                             stent         +--------------------+----------+-----------------+---------------------+ Venous anastomosis      93                             stent         +--------------------+----------+-----------------+---------------------+ Venous outflow         106                                           +--------------------+----------+-----------------+---------------------+ +---------------+-------------+---------+---------+----------+-----------------+                Diameter (cm)  Depth  BranchingPSV (cm/s)   Flow Volume                                  (cm)                          (ml/min)      +---------------+-------------+---------+---------+----------+-----------------+ Lt Radial Art                                     59                      Dist                                                                       +---------------+-------------+---------+---------+----------+-----------------+  Summary: The Left Brachial Axillary AVG appears to be patent throughout; Flow Volume appears to be Normal. Essentially No change from prior study on 08/20/2019; Increased Velocity seen in the Anastomosis due to angle of take off of the AVG.  *See table(s) above for measurements and observations.  Diagnosing physician: Hortencia Pilar MD Electronically signed by Hortencia Pilar MD on 03/02/2020 at 5:27:49 PM.    --------------------------------------------------------------------------------   Final       ASSESSMENT & PLAN:  1. Anemia in stage 5 chronic kidney disease, not on chronic dialysis ([place])   2. MGUS (monoclonal gammopathy of unknown significance)   3. History of colon cancer    IgA MGUS Labs are reviewed and discussed with patient. Slight increase of M protein level to 0.3 IgA kappa as well as additional IgG Kappa band.  Significance of the finding is unclear. Continue to monitor.   # Anemia in chronic kidney disease.  Since he has started on HD, his IV iron and EPO will be taken care by nephrologist. Communicated with Dr.Kolluro.   Remote history of colon cancer.  CEA is normal.    Orders Placed This Encounter  Procedures  . CBC with Differential/Platelet    Standing Status:   Future    Standing Expiration Date:   03/10/2021  . CEA    Standing Status:   Future    Standing Expiration Date:   03/10/2021  . Multiple Myeloma Panel (SPEP&IFE w/QIG)    Standing Status:   Future    Standing Expiration Date:   03/10/2021    All questions were answered. The patient knows to call the clinic with any problems questions or concerns.  cc [name], MD    Return of visit: 6 months    [name], MD, PhD Hematology Oncology [facility] at [facility] Pager- [phone] 03/10/2020

## 2020-03-12 DIAGNOSIS — Z992 Dependence on renal dialysis: Secondary | ICD-10-CM | POA: Diagnosis not present

## 2020-03-12 DIAGNOSIS — N186 End stage renal disease: Secondary | ICD-10-CM | POA: Diagnosis not present

## 2020-03-14 DIAGNOSIS — D631 Anemia in chronic kidney disease: Secondary | ICD-10-CM | POA: Diagnosis not present

## 2020-03-14 DIAGNOSIS — Z992 Dependence on renal dialysis: Secondary | ICD-10-CM | POA: Diagnosis not present

## 2020-03-14 DIAGNOSIS — N186 End stage renal disease: Secondary | ICD-10-CM | POA: Diagnosis not present

## 2020-03-14 DIAGNOSIS — Z23 Encounter for immunization: Secondary | ICD-10-CM | POA: Diagnosis not present

## 2020-03-14 DIAGNOSIS — D509 Iron deficiency anemia, unspecified: Secondary | ICD-10-CM | POA: Diagnosis not present

## 2020-03-14 DIAGNOSIS — N2581 Secondary hyperparathyroidism of renal origin: Secondary | ICD-10-CM | POA: Diagnosis not present

## 2020-03-15 DIAGNOSIS — E1159 Type 2 diabetes mellitus with other circulatory complications: Secondary | ICD-10-CM | POA: Diagnosis not present

## 2020-03-15 DIAGNOSIS — I700 Atherosclerosis of aorta: Secondary | ICD-10-CM | POA: Diagnosis not present

## 2020-03-15 DIAGNOSIS — Z992 Dependence on renal dialysis: Secondary | ICD-10-CM | POA: Diagnosis not present

## 2020-03-15 DIAGNOSIS — I10 Essential (primary) hypertension: Secondary | ICD-10-CM | POA: Diagnosis not present

## 2020-03-15 DIAGNOSIS — E1122 Type 2 diabetes mellitus with diabetic chronic kidney disease: Secondary | ICD-10-CM | POA: Diagnosis not present

## 2020-03-15 DIAGNOSIS — E1142 Type 2 diabetes mellitus with diabetic polyneuropathy: Secondary | ICD-10-CM | POA: Diagnosis not present

## 2020-03-15 DIAGNOSIS — N186 End stage renal disease: Secondary | ICD-10-CM | POA: Diagnosis not present

## 2020-03-15 DIAGNOSIS — Z0000 Encounter for general adult medical examination without abnormal findings: Secondary | ICD-10-CM | POA: Diagnosis not present

## 2020-03-15 DIAGNOSIS — Z79899 Other long term (current) drug therapy: Secondary | ICD-10-CM | POA: Diagnosis not present

## 2020-03-15 DIAGNOSIS — E7800 Pure hypercholesterolemia, unspecified: Secondary | ICD-10-CM | POA: Diagnosis not present

## 2020-03-15 DIAGNOSIS — I38 Endocarditis, valve unspecified: Secondary | ICD-10-CM | POA: Diagnosis not present

## 2020-03-15 DIAGNOSIS — Z794 Long term (current) use of insulin: Secondary | ICD-10-CM | POA: Diagnosis not present

## 2020-04-10 ENCOUNTER — Ambulatory Visit: Payer: PPO | Admitting: Cardiovascular Disease

## 2020-04-11 DIAGNOSIS — Z992 Dependence on renal dialysis: Secondary | ICD-10-CM | POA: Diagnosis not present

## 2020-04-11 DIAGNOSIS — N186 End stage renal disease: Secondary | ICD-10-CM | POA: Diagnosis not present

## 2020-04-13 DIAGNOSIS — N186 End stage renal disease: Secondary | ICD-10-CM | POA: Diagnosis not present

## 2020-04-13 DIAGNOSIS — Z992 Dependence on renal dialysis: Secondary | ICD-10-CM | POA: Diagnosis not present

## 2020-04-13 DIAGNOSIS — N2581 Secondary hyperparathyroidism of renal origin: Secondary | ICD-10-CM | POA: Diagnosis not present

## 2020-04-13 DIAGNOSIS — E1122 Type 2 diabetes mellitus with diabetic chronic kidney disease: Secondary | ICD-10-CM | POA: Diagnosis not present

## 2020-04-13 DIAGNOSIS — D631 Anemia in chronic kidney disease: Secondary | ICD-10-CM | POA: Diagnosis not present

## 2020-04-13 DIAGNOSIS — D509 Iron deficiency anemia, unspecified: Secondary | ICD-10-CM | POA: Diagnosis not present

## 2020-05-01 ENCOUNTER — Other Ambulatory Visit (INDEPENDENT_AMBULATORY_CARE_PROVIDER_SITE_OTHER): Payer: Self-pay | Admitting: Nurse Practitioner

## 2020-05-01 DIAGNOSIS — I701 Atherosclerosis of renal artery: Secondary | ICD-10-CM

## 2020-05-12 DIAGNOSIS — Z992 Dependence on renal dialysis: Secondary | ICD-10-CM | POA: Diagnosis not present

## 2020-05-12 DIAGNOSIS — N186 End stage renal disease: Secondary | ICD-10-CM | POA: Diagnosis not present

## 2020-05-14 DIAGNOSIS — D631 Anemia in chronic kidney disease: Secondary | ICD-10-CM | POA: Diagnosis not present

## 2020-05-14 DIAGNOSIS — Z23 Encounter for immunization: Secondary | ICD-10-CM | POA: Diagnosis not present

## 2020-05-14 DIAGNOSIS — N186 End stage renal disease: Secondary | ICD-10-CM | POA: Diagnosis not present

## 2020-05-14 DIAGNOSIS — Z992 Dependence on renal dialysis: Secondary | ICD-10-CM | POA: Diagnosis not present

## 2020-05-14 DIAGNOSIS — D509 Iron deficiency anemia, unspecified: Secondary | ICD-10-CM | POA: Diagnosis not present

## 2020-05-14 DIAGNOSIS — N2581 Secondary hyperparathyroidism of renal origin: Secondary | ICD-10-CM | POA: Diagnosis not present

## 2020-05-15 NOTE — Progress Notes (Unsigned)
Cardiology Office Note  Date:  05/17/2020   ID:  Dylan Sanders, DOB 1931-10-13, MRN [id number]  PCP:  [name], MD   Chief Complaint  Patient presents with  . Other    12 month follow up. Meds reviewed verbally with patient.     HPI:  [name] is a 87 year old gentleman with past medical history of Hypertension Type 2 diabetes Chronic kidney disease, stage IV, creatinine 3.99 Renal artery stenosis, left renal artery is a total occlusion and that the right renal artery is only 50% stenosis no intervention  Hyperlipidemia, on a statin Colon cancer<12 yr ago Smoking history quit April 1978 MGUS CT coronary calcium score February 2019 , score of 318, LAD and RCA Right middle lobe nodules Aortic atherosclerosis Who presents for follow-up of his aortic valve disease, PAD  Chronic kidney disease stage IV followed by nephrology Diabetes followed by endocrinology Followed by cancer center for anemia  Echo 12/20 Aortic valve regurgitation is moderate.  Mild to moderate mitral valve regurgitation. Tricuspid  valve regurgitation mild-moderate.  Mild to moderate aortic valve stenosis.  Moderately elevated pulmonary artery systolic pressure.  Bowling every Monday Has a girlfriend, likes to gamble  HD 3x a week HGB 10.6, up from 9.6 (was weak) On iron  Good energy, fells well No edema, no ABD swelling  weight stable Eating well Left arm graft, some numbness left hand at night, rare cramps  HBA1C 5.8 Total chol 116, LDL 50  EKG personally reviewed by myself on todays visit Shows normal sinus rhythm rate 62 bpm no significant ST-T wave changes  Other past medical history reviewed Echocardiogram January 2019 Normal LV function, very mild aortic valve stenosis  CT coronary calcium score February 2019  318, LAD and RCA Right middle lobe nodules Aortic atherosclerosis   followed by Dr. Ronalee Belts for renal stenosis Angiography of the renal arteries was  performed 11/19/2016  Noted to have occlusion of the left renal artery was identified  50% narrowing of the right renal artery was noted. No intervention was performed.   PMH:   has a past medical history of Anemia, Cancer ([place]) (2008), Chronic kidney disease, Diabetes mellitus without complication ([place]), Heart murmur, and Hypertension.  PSH:    Past Surgical History:  Procedure Laterality Date  . A/V SHUNT INTERVENTION Left 07/27/2019   Procedure: A/V SHUNT INTERVENTION;  Surgeon: Katha Cabal, MD;  Location: [place] [facility];  Service: Cardiovascular;  Laterality: Left;  . AV FISTULA PLACEMENT Left 01/08/2019   Procedure: INSERTION OF ARTERIOVENOUS (AV) GORE-TEX GRAFT ARM ( BRACHIAL AXILLARY );  Surgeon: Katha Cabal, MD;  Location: [facility] ORS;  Service: Vascular;  Laterality: Left;  . COLON SURGERY    . EYE SURGERY     bilateral  . RENAL ANGIOGRAPHY Bilateral 11/19/2016   Procedure: Renal Angiography;  Surgeon: Katha Cabal, MD;  Location: [place] [facility];  Service: Cardiovascular;  Laterality: Bilateral;  . RENAL ANGIOGRAPHY Right 07/03/2018   Procedure: RENAL ANGIOGRAPHY;  Surgeon: Katha Cabal, MD;  Location: [place] [facility];  Service: Cardiovascular;  Laterality: Right;  . REVERSE SHOULDER ARTHROPLASTY Left 09/19/2016   Procedure: REVERSE SHOULDER ARTHROPLASTY;  Surgeon: Corky Mull, MD;  Location: [facility] ORS;  Service: Orthopedics;  Laterality: Left;  . ROTATOR CUFF REPAIR Right     Current Outpatient Medications  Medication Sig Dispense Refill  . allopurinol (ZYLOPRIM) 100 MG tablet Take 100 mg by mouth at bedtime.     Marland Kitchen  amLODipine (NORVASC) 10 MG tablet Take 10 mg by mouth every morning.     Marland Kitchen aspirin EC 81 MG tablet Take 81 mg by mouth at bedtime.     . B Complex-C-Folic Acid (RENA-VITE RX) 1 MG TABS Take 1 tablet by mouth daily.    . carvedilol (COREG) 3.125 MG tablet Take 1 tablet (3.125 mg total) by mouth 2 (two) times daily with a meal.  180 tablet 3  . cloNIDine (CATAPRES) 0.1 MG tablet Take 0.1 mg by mouth 2 (two) times daily.     . clopidogrel (PLAVIX) 75 MG tablet Take 1 tablet by mouth once daily 90 tablet 0  . ferrous sulfate 325 (65 FE) MG EC tablet Take 1 tablet (325 mg total) by mouth 2 (two) times daily. 90 tablet 1  . LANTUS SOLOSTAR 100 UNIT/ML Solostar Pen Inject 10 Units into the skin every morning.     Marland Kitchen losartan (COZAAR) 100 MG tablet Take by mouth.    . psyllium (METAMUCIL) 58.6 % powder Take 1 packet by mouth daily.    . sevelamer carbonate (RENVELA) 800 MG tablet Take 800 mg by mouth 3 (three) times daily.    . simvastatin (ZOCOR) 20 MG tablet Take 20 mg by mouth at bedtime.    Marland Kitchen telmisartan (MICARDIS) 80 MG tablet Take 80 mg by mouth every morning.     . vitamin B-12 (CYANOCOBALAMIN) 1000 MCG tablet Take 1 tablet (1,000 mcg total) by mouth daily. 90 tablet 0  . vitamin C (ASCORBIC ACID) 500 MG tablet Take 1 tablet (500 mg total) by mouth daily. 90 tablet 0   No current facility-administered medications for this visit.    Allergies:   Ace inhibitors   Social History:  The patient  reports that he quit smoking about 43 years ago. His smoking use included pipe, cigarettes, and cigars. He quit after 25.00 years of use. He has never used smokeless tobacco. He reports current alcohol use. He reports that he does not use drugs.   Family History:   family history includes Cancer in his father.    Review of Systems: Review of Systems  Constitutional: Negative.   HENT: Negative.   Respiratory: Negative.   Cardiovascular: Negative.   Gastrointestinal: Negative.   Musculoskeletal: Negative.   Neurological: Negative.   Psychiatric/Behavioral: Negative.   All other systems reviewed and are negative.   PHYSICAL EXAM: VS:  BP 118/60 (BP Location: Left Arm, Patient Position: Sitting, Cuff Size: Normal)   Pulse 62   Ht 5\' 7"  (1.702 m)   Wt 147 lb (66.7 kg)   SpO2 95%   BMI 23.02 kg/m  , BMI Body mass  index is 23.02 kg/m. Constitutional:  oriented to person, place, and time. No distress.  HENT:  Head: Grossly normal Eyes:  no discharge. No scleral icterus.  Neck: No JVD, no carotid bruits  Cardiovascular: Regular rate and rhythm, 2/6 SEM left and RSB,  Pulmonary/Chest: Clear to auscultation bilaterally, no wheezes or rails Abdominal: Soft.  no distension.  no tenderness.  Musculoskeletal: Normal range of motion Neurological:  normal muscle tone. Coordination normal. No atrophy Skin: Skin warm and dry Psychiatric: normal affect, pleasant   Recent Labs: 02/25/2020: ALT 15; BUN 41; Creatinine, Ser 5.36; Hemoglobin 11.9; Platelets 169; Potassium 4.0; Sodium 137    Lipid Panel No results found for: CHOL, HDL, LDLCALC, TRIG    Wt Readings from Last 3 Encounters:  05/17/20 147 lb (66.7 kg)  03/10/20 147 lb 1.6 oz (66.7 kg)  03/01/20 144 lb (65.3 kg)      ASSESSMENT AND PLAN:  Renal artery stenosis ([place]) -  Managed by Dr. Ronalee Belts, occluded on left,  S/p stent 06/2018 Will need periodic ultrasound Non-smoker, cholesterol well controlled  Chronic kidney disease, stage IV Has AV graft, Followed by nephrology Tolerating dialysis 3 days a week, weight stable  Type 2 diabetes mellitus with stage 4 chronic kidney disease, with long-term current use of insulin ([place]) -  Low HBA1C Weight stable  Mixed hyperlipidemia -  Cholesterol is at goal on the current lipid regimen. No changes to the medications were made.  Essential hypertension -  Blood pressure stable on dialysis Denies orthostasis  Carotid bruit <39% b/l carotid disease on ultrasound January 2019  Aortic valve disease Repeat echocardiogram ordered end of 2022 Multivalve disease including tricuspid valve, mitral valve, aortic valve, Details discussed with him today    Total encounter time more than 25 minutes  Greater than 50% was spent in counseling and coordination of care with the patient    Orders  Placed This Encounter  Procedures  . EKG 12-Lead     Signed, Esmond Plants, M.D., Ph.D. 05/17/2020  [place], [place]

## 2020-05-17 ENCOUNTER — Ambulatory Visit: Payer: PPO | Admitting: Cardiovascular Disease

## 2020-05-17 ENCOUNTER — Other Ambulatory Visit: Payer: Self-pay

## 2020-05-17 ENCOUNTER — Encounter: Payer: Self-pay | Admitting: Cardiovascular Disease

## 2020-05-17 VITALS — BP 118/60 | HR 62 | Ht 67.0 in | Wt 147.0 lb

## 2020-05-17 DIAGNOSIS — I779 Disorder of arteries and arterioles, unspecified: Secondary | ICD-10-CM

## 2020-05-17 DIAGNOSIS — E782 Mixed hyperlipidemia: Secondary | ICD-10-CM

## 2020-05-17 DIAGNOSIS — Z794 Long term (current) use of insulin: Secondary | ICD-10-CM | POA: Diagnosis not present

## 2020-05-17 DIAGNOSIS — I701 Atherosclerosis of renal artery: Secondary | ICD-10-CM

## 2020-05-17 DIAGNOSIS — N184 Chronic kidney disease, stage 4 (severe): Secondary | ICD-10-CM | POA: Diagnosis not present

## 2020-05-17 DIAGNOSIS — I359 Nonrheumatic aortic valve disorder, unspecified: Secondary | ICD-10-CM

## 2020-05-17 DIAGNOSIS — I739 Peripheral vascular disease, unspecified: Secondary | ICD-10-CM

## 2020-05-17 DIAGNOSIS — I10 Essential (primary) hypertension: Secondary | ICD-10-CM

## 2020-05-17 DIAGNOSIS — E1122 Type 2 diabetes mellitus with diabetic chronic kidney disease: Secondary | ICD-10-CM | POA: Diagnosis not present

## 2020-05-17 DIAGNOSIS — N2889 Other specified disorders of kidney and ureter: Secondary | ICD-10-CM

## 2020-05-17 NOTE — Patient Instructions (Addendum)
  Medication Instructions:  No changes  If you need a refill on your cardiac medications before your next appointment, please call your pharmacy.    Lab work: No new labs needed   If you have labs (blood work) drawn today and your tests are completely normal, you will receive your results only by: Marland Kitchen MyChart Message (if you have MyChart) OR . A paper copy in the mail If you have any lab test that is abnormal or we need to change your treatment, we will call you to review the results.   Testing/Procedures:  1. Echocardiogram Please [facility]/2021 for an Echocardiogram. Your physician has requested that you have an echocardiogram. Echocardiography is a painless test that uses sound waves to create images of your heart. It provides your doctor with information about the size and shape of your heart and how well your heart's chambers and valves are working. This procedure takes approximately one hour. There are no restrictions for this procedure. Please note; depending on visual quality an IV may need to be placed.      Follow-Up: At [facility], you and your health needs are our priority.  As part of our continuing mission to provide you with exceptional heart care, we have created designated Provider Care Teams.  These Care Teams include your primary Cardiologist (physician) and Advanced Practice Providers (APPs -  Physician Assistants and Nurse Practitioners) who all work together to provide you with the care you need, when you need it.  . You will need a follow up appointment in 12 months after echo  . Providers on your designated Care Team:   . Murray Hodgkins, NP . Christell Faith, PA-C . Marrianne Mood, PA-C  Any Other Special Instructions Will Be Listed Below (If Applicable).  COVID-19 Vaccine Information can be found at: [email or web address].uk For questions related to vaccine distribution or  appointments, please email [email or web address] or call [phone].

## 2020-05-24 DIAGNOSIS — H35371 Puckering of macula, right eye: Secondary | ICD-10-CM | POA: Diagnosis not present

## 2020-06-09 ENCOUNTER — Other Ambulatory Visit: Payer: Self-pay | Admitting: Cardiovascular Disease

## 2020-06-12 DIAGNOSIS — N186 End stage renal disease: Secondary | ICD-10-CM | POA: Diagnosis not present

## 2020-06-12 DIAGNOSIS — Z992 Dependence on renal dialysis: Secondary | ICD-10-CM | POA: Diagnosis not present

## 2020-06-13 DIAGNOSIS — Z992 Dependence on renal dialysis: Secondary | ICD-10-CM | POA: Diagnosis not present

## 2020-06-13 DIAGNOSIS — N2581 Secondary hyperparathyroidism of renal origin: Secondary | ICD-10-CM | POA: Diagnosis not present

## 2020-06-13 DIAGNOSIS — N186 End stage renal disease: Secondary | ICD-10-CM | POA: Diagnosis not present

## 2020-06-13 DIAGNOSIS — D631 Anemia in chronic kidney disease: Secondary | ICD-10-CM | POA: Diagnosis not present

## 2020-06-13 DIAGNOSIS — Z23 Encounter for immunization: Secondary | ICD-10-CM | POA: Diagnosis not present

## 2020-06-13 DIAGNOSIS — D509 Iron deficiency anemia, unspecified: Secondary | ICD-10-CM | POA: Diagnosis not present

## 2020-06-14 ENCOUNTER — Encounter: Payer: Self-pay | Admitting: Gastroenterology

## 2020-07-10 DIAGNOSIS — N186 End stage renal disease: Secondary | ICD-10-CM | POA: Diagnosis not present

## 2020-07-10 DIAGNOSIS — Z992 Dependence on renal dialysis: Secondary | ICD-10-CM | POA: Diagnosis not present

## 2020-07-11 DIAGNOSIS — Z23 Encounter for immunization: Secondary | ICD-10-CM | POA: Diagnosis not present

## 2020-07-11 DIAGNOSIS — E1122 Type 2 diabetes mellitus with diabetic chronic kidney disease: Secondary | ICD-10-CM | POA: Diagnosis not present

## 2020-07-11 DIAGNOSIS — N186 End stage renal disease: Secondary | ICD-10-CM | POA: Diagnosis not present

## 2020-07-11 DIAGNOSIS — N2581 Secondary hyperparathyroidism of renal origin: Secondary | ICD-10-CM | POA: Diagnosis not present

## 2020-07-11 DIAGNOSIS — Z992 Dependence on renal dialysis: Secondary | ICD-10-CM | POA: Diagnosis not present

## 2020-07-11 DIAGNOSIS — D631 Anemia in chronic kidney disease: Secondary | ICD-10-CM | POA: Diagnosis not present

## 2020-07-18 DIAGNOSIS — D509 Iron deficiency anemia, unspecified: Secondary | ICD-10-CM | POA: Diagnosis not present

## 2020-07-18 DIAGNOSIS — N186 End stage renal disease: Secondary | ICD-10-CM | POA: Diagnosis not present

## 2020-07-18 DIAGNOSIS — Z992 Dependence on renal dialysis: Secondary | ICD-10-CM | POA: Diagnosis not present

## 2020-07-18 DIAGNOSIS — N2581 Secondary hyperparathyroidism of renal origin: Secondary | ICD-10-CM | POA: Diagnosis not present

## 2020-07-25 DIAGNOSIS — D631 Anemia in chronic kidney disease: Secondary | ICD-10-CM | POA: Diagnosis not present

## 2020-07-25 DIAGNOSIS — Z992 Dependence on renal dialysis: Secondary | ICD-10-CM | POA: Diagnosis not present

## 2020-07-25 DIAGNOSIS — D509 Iron deficiency anemia, unspecified: Secondary | ICD-10-CM | POA: Diagnosis not present

## 2020-07-25 DIAGNOSIS — N186 End stage renal disease: Secondary | ICD-10-CM | POA: Diagnosis not present

## 2020-07-25 DIAGNOSIS — N2581 Secondary hyperparathyroidism of renal origin: Secondary | ICD-10-CM | POA: Diagnosis not present

## 2020-07-28 DIAGNOSIS — H6123 Impacted cerumen, bilateral: Secondary | ICD-10-CM | POA: Diagnosis not present

## 2020-07-28 DIAGNOSIS — R040 Epistaxis: Secondary | ICD-10-CM | POA: Diagnosis not present

## 2020-08-01 DIAGNOSIS — D509 Iron deficiency anemia, unspecified: Secondary | ICD-10-CM | POA: Diagnosis not present

## 2020-08-01 DIAGNOSIS — N2581 Secondary hyperparathyroidism of renal origin: Secondary | ICD-10-CM | POA: Diagnosis not present

## 2020-08-01 DIAGNOSIS — Z992 Dependence on renal dialysis: Secondary | ICD-10-CM | POA: Diagnosis not present

## 2020-08-01 DIAGNOSIS — N186 End stage renal disease: Secondary | ICD-10-CM | POA: Diagnosis not present

## 2020-08-07 ENCOUNTER — Other Ambulatory Visit (INDEPENDENT_AMBULATORY_CARE_PROVIDER_SITE_OTHER): Payer: Self-pay | Admitting: Nurse Practitioner

## 2020-08-07 DIAGNOSIS — I701 Atherosclerosis of renal artery: Secondary | ICD-10-CM

## 2020-08-08 DIAGNOSIS — Z992 Dependence on renal dialysis: Secondary | ICD-10-CM | POA: Diagnosis not present

## 2020-08-08 DIAGNOSIS — D509 Iron deficiency anemia, unspecified: Secondary | ICD-10-CM | POA: Diagnosis not present

## 2020-08-08 DIAGNOSIS — N186 End stage renal disease: Secondary | ICD-10-CM | POA: Diagnosis not present

## 2020-08-08 DIAGNOSIS — D631 Anemia in chronic kidney disease: Secondary | ICD-10-CM | POA: Diagnosis not present

## 2020-08-08 DIAGNOSIS — N2581 Secondary hyperparathyroidism of renal origin: Secondary | ICD-10-CM | POA: Diagnosis not present

## 2020-08-10 DIAGNOSIS — Z992 Dependence on renal dialysis: Secondary | ICD-10-CM | POA: Diagnosis not present

## 2020-08-10 DIAGNOSIS — N186 End stage renal disease: Secondary | ICD-10-CM | POA: Diagnosis not present

## 2020-08-12 DIAGNOSIS — N186 End stage renal disease: Secondary | ICD-10-CM | POA: Diagnosis not present

## 2020-08-12 DIAGNOSIS — N2581 Secondary hyperparathyroidism of renal origin: Secondary | ICD-10-CM | POA: Diagnosis not present

## 2020-08-12 DIAGNOSIS — Z992 Dependence on renal dialysis: Secondary | ICD-10-CM | POA: Diagnosis not present

## 2020-08-15 DIAGNOSIS — Z23 Encounter for immunization: Secondary | ICD-10-CM | POA: Diagnosis not present

## 2020-08-15 DIAGNOSIS — N186 End stage renal disease: Secondary | ICD-10-CM | POA: Diagnosis not present

## 2020-08-15 DIAGNOSIS — D509 Iron deficiency anemia, unspecified: Secondary | ICD-10-CM | POA: Diagnosis not present

## 2020-08-15 DIAGNOSIS — Z992 Dependence on renal dialysis: Secondary | ICD-10-CM | POA: Diagnosis not present

## 2020-08-15 DIAGNOSIS — N2581 Secondary hyperparathyroidism of renal origin: Secondary | ICD-10-CM | POA: Diagnosis not present

## 2020-08-22 DIAGNOSIS — N2581 Secondary hyperparathyroidism of renal origin: Secondary | ICD-10-CM | POA: Diagnosis not present

## 2020-08-22 DIAGNOSIS — N186 End stage renal disease: Secondary | ICD-10-CM | POA: Diagnosis not present

## 2020-08-22 DIAGNOSIS — D509 Iron deficiency anemia, unspecified: Secondary | ICD-10-CM | POA: Diagnosis not present

## 2020-08-22 DIAGNOSIS — Z992 Dependence on renal dialysis: Secondary | ICD-10-CM | POA: Diagnosis not present

## 2020-08-22 DIAGNOSIS — D631 Anemia in chronic kidney disease: Secondary | ICD-10-CM | POA: Diagnosis not present

## 2020-08-29 ENCOUNTER — Other Ambulatory Visit (INDEPENDENT_AMBULATORY_CARE_PROVIDER_SITE_OTHER): Payer: Self-pay | Admitting: Nurse Practitioner

## 2020-08-29 DIAGNOSIS — N186 End stage renal disease: Secondary | ICD-10-CM | POA: Diagnosis not present

## 2020-08-29 DIAGNOSIS — Z992 Dependence on renal dialysis: Secondary | ICD-10-CM | POA: Diagnosis not present

## 2020-08-29 DIAGNOSIS — N2581 Secondary hyperparathyroidism of renal origin: Secondary | ICD-10-CM | POA: Diagnosis not present

## 2020-08-29 DIAGNOSIS — D509 Iron deficiency anemia, unspecified: Secondary | ICD-10-CM | POA: Diagnosis not present

## 2020-08-30 ENCOUNTER — Ambulatory Visit (INDEPENDENT_AMBULATORY_CARE_PROVIDER_SITE_OTHER): Payer: PPO

## 2020-08-30 ENCOUNTER — Ambulatory Visit (INDEPENDENT_AMBULATORY_CARE_PROVIDER_SITE_OTHER): Payer: PPO | Admitting: Nurse Practitioner

## 2020-08-30 ENCOUNTER — Encounter (INDEPENDENT_AMBULATORY_CARE_PROVIDER_SITE_OTHER): Payer: Self-pay | Admitting: Nurse Practitioner

## 2020-08-30 ENCOUNTER — Other Ambulatory Visit: Payer: Self-pay

## 2020-08-30 VITALS — BP 158/51 | HR 62 | Resp 16 | Ht 68.0 in | Wt 145.0 lb

## 2020-08-30 DIAGNOSIS — E7800 Pure hypercholesterolemia, unspecified: Secondary | ICD-10-CM

## 2020-08-30 DIAGNOSIS — N186 End stage renal disease: Secondary | ICD-10-CM

## 2020-08-30 DIAGNOSIS — I10 Essential (primary) hypertension: Secondary | ICD-10-CM | POA: Diagnosis not present

## 2020-08-30 DIAGNOSIS — Z992 Dependence on renal dialysis: Secondary | ICD-10-CM | POA: Diagnosis not present

## 2020-09-04 ENCOUNTER — Encounter (INDEPENDENT_AMBULATORY_CARE_PROVIDER_SITE_OTHER): Payer: Self-pay | Admitting: Nurse Practitioner

## 2020-09-04 NOTE — Progress Notes (Signed)
Subjective:    Patient ID: Dylan Sanders, male    DOB: June 05, 1931, 87 y.o.   MRN: [id number] Chief Complaint  Patient presents with  . Follow-up    ultasound    The patient returns to the office for followup of their dialysis access. The function of the access has been stable. The patient denies increased bleeding time or increased recirculation. Patient denies difficulty with cannulation. The patient denies hand pain or other symptoms consistent with steal phenomena.  No significant arm swelling.  The patient denies redness or swelling at the access site. The patient denies fever or chills at home or while on dialysis.  The patient denies amaurosis fugax or recent TIA symptoms. There are no recent neurological changes noted. The patient denies claudication symptoms or rest pain symptoms. The patient denies history of DVT, PE or superficial thrombophlebitis. The patient denies recent episodes of angina or shortness of breath.   Today the patient has a flow volume of 2348.  There is no evidence of significant stenosis in his left brachial axillary AV graft.      Review of Systems  Hematological: Does not bruise/bleed easily.  All other systems reviewed and are negative.      Objective:   Physical Exam Vitals reviewed.  HENT:     Head: Normocephalic.  Cardiovascular:     Rate and Rhythm: Normal rate.     Pulses: Normal pulses.          Radial pulses are 2+ on the left side.     Arteriovenous access: left arteriovenous access is present.    Comments: Good thrill and bruit Pulmonary:     Effort: Pulmonary effort is normal.  Neurological:     Mental Status: He is alert and oriented to person, place, and time.  Psychiatric:        Mood and Affect: Mood normal.        Behavior: Behavior normal.        Thought Content: Thought content normal.        Judgment: Judgment normal.     BP (!) 158/51 (BP Location: Right Arm)   Pulse 62   Resp 16   Ht 5\' 8"  (1.727 m)   Wt  145 lb (65.8 kg)   BMI 22.05 kg/m   Past Medical History:  Diagnosis Date  . Anemia   . Cancer [facility]) 2008   colon  . Chronic kidney disease   . Diabetes mellitus without complication ([place])   . Heart murmur    asymptomatic  . Hypertension     Social History   Socioeconomic History  . Marital status: Widowed    Spouse name: Not on file  . Number of children: Not on file  . Years of education: Not on file  . Highest education level: Not on file  Occupational History  . Not on file  Tobacco Use  . Smoking status: Former Smoker    Years: 25.00    Types: Pipe, Cigarettes, Cigars    Quit date: 09/05/1976    Years since quitting: 44.0  . Smokeless tobacco: Never Used  Vaping Use  . Vaping Use: Never used  Substance and Sexual Activity  . Alcohol use: Yes    Comment: ocassionally  . Drug use: No  . Sexual activity: Never  Other Topics Concern  . Not on file  Social History Narrative  . Not on file   Social Determinants of Health   Financial Resource Strain: Not on file  Food Insecurity: Not on file  Transportation Needs: Not on file  Physical Activity: Not on file  Stress: Not on file  Social Connections: Not on file  Intimate Partner Violence: Not on file    Past Surgical History:  Procedure Laterality Date  . A/V SHUNT INTERVENTION Left 07/27/2019   Procedure: A/V SHUNT INTERVENTION;  Surgeon: Katha Cabal, MD;  Location: [place] [facility];  Service: Cardiovascular;  Laterality: Left;  . AV FISTULA PLACEMENT Left 01/08/2019   Procedure: INSERTION OF ARTERIOVENOUS (AV) GORE-TEX GRAFT ARM ( BRACHIAL AXILLARY );  Surgeon: Katha Cabal, MD;  Location: [facility];  Service: Vascular;  Laterality: Left;  . COLON SURGERY    . EYE SURGERY     bilateral  . RENAL ANGIOGRAPHY Bilateral 11/19/2016   Procedure: Renal Angiography;  Surgeon: Katha Cabal, MD;  Location: [place] [facility];  Service: Cardiovascular;  Laterality: Bilateral;  . RENAL  ANGIOGRAPHY Right 07/03/2018   Procedure: RENAL ANGIOGRAPHY;  Surgeon: Katha Cabal, MD;  Location: [place] [facility];  Service: Cardiovascular;  Laterality: Right;  . REVERSE SHOULDER ARTHROPLASTY Left 09/19/2016   Procedure: REVERSE SHOULDER ARTHROPLASTY;  Surgeon: Corky Mull, MD;  Location: [facility];  Service: Orthopedics;  Laterality: Left;  . ROTATOR CUFF REPAIR Right     Family History  Problem Relation Age of Onset  . Cancer Father     Allergies  Allergen Reactions  . Ace Inhibitors Other (See Comments)    CBC Latest Ref Rng & Units 02/25/2020 08/25/2019 07/27/2019  WBC 4.0 - 10.5 K/uL 4.9 5.0 4.9  Hemoglobin 13.0 - 17.0 g/dL 11.9(L) 9.6(L) 8.0(L)  Hematocrit 39.0 - 52.0 % 36.1(L) 29.7(L) 22.8(L)  Platelets 150 - 400 K/uL 169 156 162      CMP     Component Value Date/Time   NA 137 02/25/2020 1044   NA 141 06/19/2011 1343   K 4.0 02/25/2020 1044   K 4.1 06/19/2011 1343   CL 92 (L) 02/25/2020 1044   CL 102 06/19/2011 1343   CO2 30 02/25/2020 1044   CO2 32 06/19/2011 1343   GLUCOSE 189 (H) 02/25/2020 1044   GLUCOSE 193 (H) 06/19/2011 1343   BUN 41 (H) 02/25/2020 1044   BUN 26 (H) 06/19/2011 1343   CREATININE 5.36 (H) 02/25/2020 1044   CREATININE 2.43 (H) 06/19/2011 1343   CALCIUM 8.7 (L) 02/25/2020 1044   CALCIUM 8.9 06/19/2011 1343   PROT 7.3 02/25/2020 1044   PROT 7.3 06/19/2011 1343   ALBUMIN 4.1 02/25/2020 1044   ALBUMIN 3.8 06/19/2011 1343   AST 21 02/25/2020 1044   AST 24 06/19/2011 1343   ALT 15 02/25/2020 1044   ALT 35 06/19/2011 1343   ALKPHOS 57 02/25/2020 1044   ALKPHOS 89 06/19/2011 1343   BILITOT 0.9 02/25/2020 1044   BILITOT 0.6 06/19/2011 1343   GFRNONAA 9 (L) 02/25/2020 1044   GFRNONAA 28 (L) 06/19/2011 1343   GFRAA 21 (L) 07/27/2019 1531   GFRAA 33 (L) 06/19/2011 1343     No results found.     Assessment & Plan:   1. ESRD on dialysis [facility]) Recommend:  The patient is doing well and currently has adequate dialysis  access. The patient's dialysis center is not reporting any access issues. Flow pattern is stable when compared to the prior ultrasound.  The patient should have a duplex ultrasound of the dialysis access in 12 months, sooner if issues arise The patient will follow-up with me in the  office after each ultrasound     2. Hypertension, essential Continue antihypertensive medications as already ordered, these medications have been reviewed and there are no changes at this time.   3. Pure hypercholesterolemia Continue statin as ordered and reviewed, no changes at this time    Current Outpatient Medications on File Prior to Visit  Medication Sig Dispense Refill  . allopurinol (ZYLOPRIM) 100 MG tablet Take 100 mg by mouth at bedtime.     Marland Kitchen amLODipine (NORVASC) 10 MG tablet Take 10 mg by mouth every morning.     Marland Kitchen aspirin EC 81 MG tablet Take 81 mg by mouth at bedtime.     . B Complex-C-Folic Acid (RENA-VITE RX) 1 MG TABS Take 1 tablet by mouth daily.    . carvedilol (COREG) 3.125 MG tablet TAKE 1 TABLET BY MOUTH TWICE DAILY WITH MEALS 180 tablet 3  . cloNIDine (CATAPRES) 0.1 MG tablet Take 0.1 mg by mouth 2 (two) times daily.     . clopidogrel (PLAVIX) 75 MG tablet Take 1 tablet by mouth once daily 90 tablet 0  . LANTUS SOLOSTAR 100 UNIT/ML Solostar Pen Inject 10 Units into the skin every morning.     Marland Kitchen losartan (COZAAR) 100 MG tablet Take by mouth.    . psyllium (METAMUCIL) 58.6 % powder Take 1 packet by mouth daily.    . sevelamer carbonate (RENVELA) 800 MG tablet Take 800 mg by mouth 3 (three) times daily.    . simvastatin (ZOCOR) 20 MG tablet Take 20 mg by mouth at bedtime.    . traZODone (DESYREL) 50 MG tablet Take by mouth.    . vitamin B-12 (CYANOCOBALAMIN) 1000 MCG tablet Take 1 tablet (1,000 mcg total) by mouth daily. 90 tablet 0  . vitamin C (ASCORBIC ACID) 500 MG tablet Take 1 tablet (500 mg total) by mouth daily. 90 tablet 0  . ferrous sulfate 325 (65 FE) MG EC tablet Take 1  tablet (325 mg total) by mouth 2 (two) times daily. (Patient not taking: Reported on 08/30/2020) 90 tablet 1  . lidocaine-prilocaine (EMLA) cream SMARTSIG:Sparingly Topical 3 Times a Week    . sevelamer (RENAGEL) 800 MG tablet Take by mouth.    . telmisartan (MICARDIS) 80 MG tablet Take 80 mg by mouth every morning.      No current facility-administered medications on file prior to visit.    There are no Patient Instructions on file for this visit. No follow-ups on file.   Kris Hartmann, NP

## 2020-09-05 DIAGNOSIS — N186 End stage renal disease: Secondary | ICD-10-CM | POA: Diagnosis not present

## 2020-09-05 DIAGNOSIS — D631 Anemia in chronic kidney disease: Secondary | ICD-10-CM | POA: Diagnosis not present

## 2020-09-05 DIAGNOSIS — N2581 Secondary hyperparathyroidism of renal origin: Secondary | ICD-10-CM | POA: Diagnosis not present

## 2020-09-05 DIAGNOSIS — D509 Iron deficiency anemia, unspecified: Secondary | ICD-10-CM | POA: Diagnosis not present

## 2020-09-05 DIAGNOSIS — Z992 Dependence on renal dialysis: Secondary | ICD-10-CM | POA: Diagnosis not present

## 2020-09-09 DIAGNOSIS — N186 End stage renal disease: Secondary | ICD-10-CM | POA: Diagnosis not present

## 2020-09-09 DIAGNOSIS — Z992 Dependence on renal dialysis: Secondary | ICD-10-CM | POA: Diagnosis not present

## 2020-09-12 DIAGNOSIS — N2581 Secondary hyperparathyroidism of renal origin: Secondary | ICD-10-CM | POA: Diagnosis not present

## 2020-09-12 DIAGNOSIS — Z992 Dependence on renal dialysis: Secondary | ICD-10-CM | POA: Diagnosis not present

## 2020-09-12 DIAGNOSIS — N186 End stage renal disease: Secondary | ICD-10-CM | POA: Diagnosis not present

## 2020-09-12 DIAGNOSIS — D509 Iron deficiency anemia, unspecified: Secondary | ICD-10-CM | POA: Diagnosis not present

## 2020-09-16 IMAGING — CR DG CHEST 2V
1 series · 2 of 2 positions shown · non-contrast
Comparison: 06/25/2017

CLINICAL DATA: Left-sided chest pain

EXAM:
CHEST - 2 VIEW

[Series 1: dg chest 2 view · 0.14mm/px · 2 of 2 slices shown]
[im 1/2]
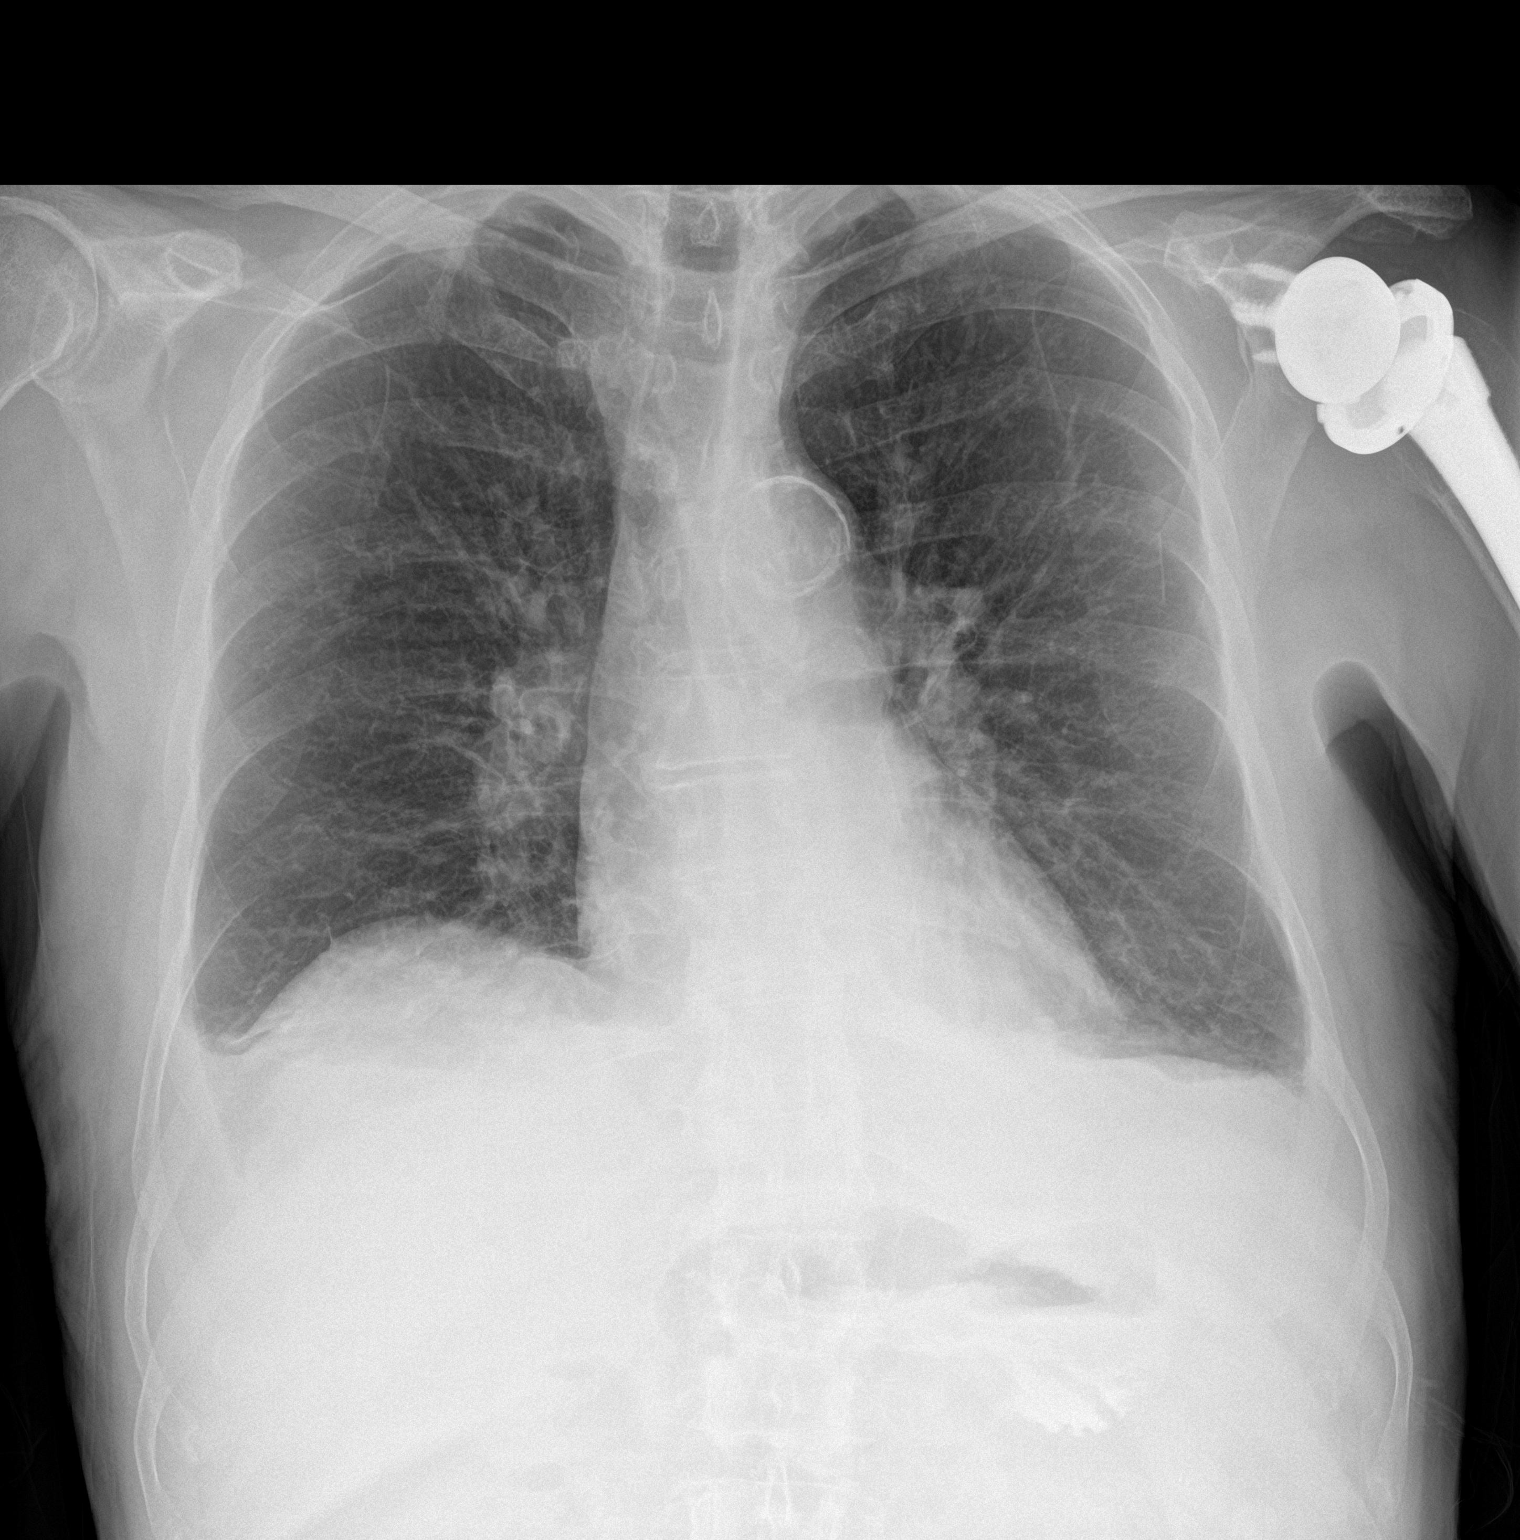
[im 2/2]
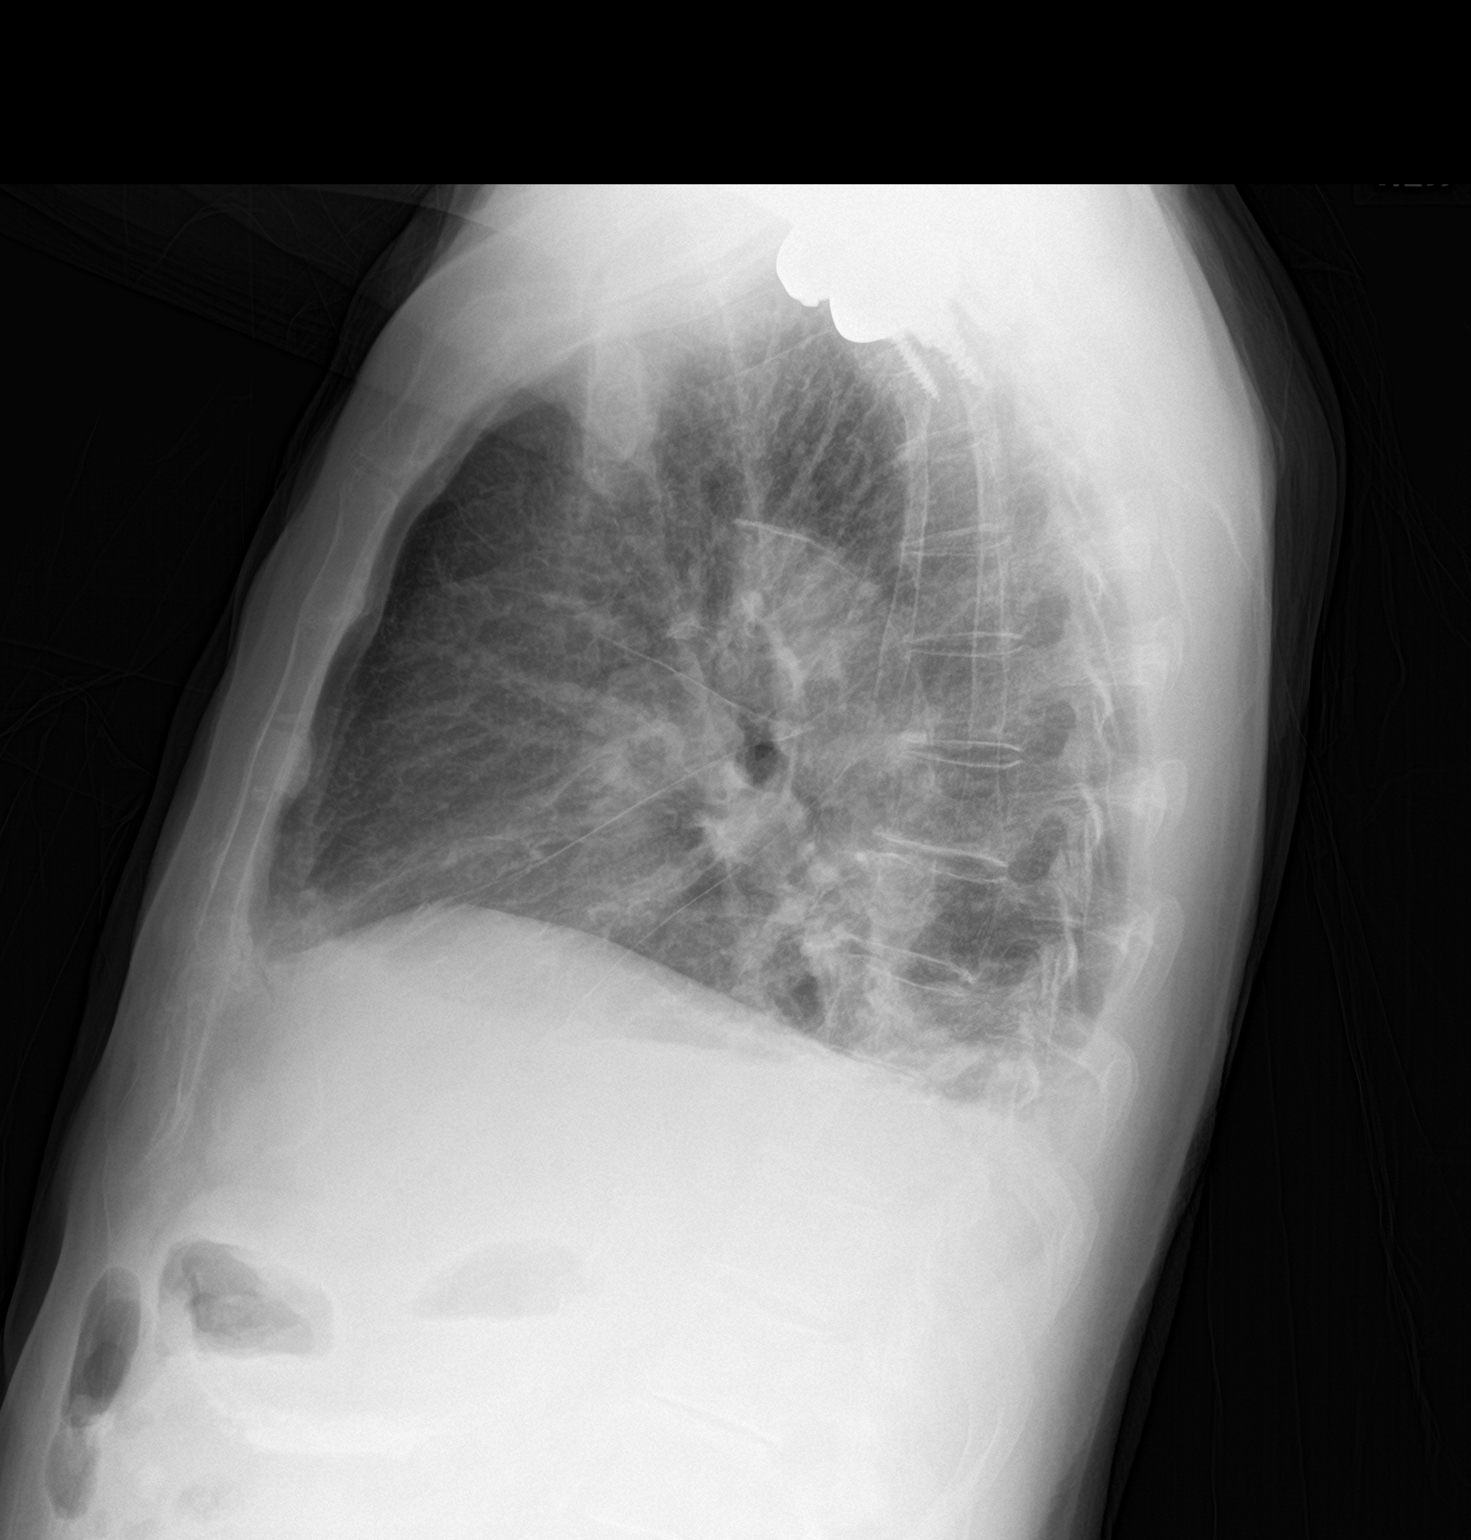

[2 of 2 positions shown; findings below may reference images not displayed]

FINDINGS: Normal heart size. Bilateral small pleural effusions and bibasilar
atelectasis. Normal vascularity. Left total shoulder arthroplasty.
No pneumothorax or pleural effusion.
IMPRESSION: Small bilateral pleural effusions and bibasilar atelectasis.

## 2020-09-19 DIAGNOSIS — D509 Iron deficiency anemia, unspecified: Secondary | ICD-10-CM | POA: Diagnosis not present

## 2020-09-19 DIAGNOSIS — N186 End stage renal disease: Secondary | ICD-10-CM | POA: Diagnosis not present

## 2020-09-19 DIAGNOSIS — Z992 Dependence on renal dialysis: Secondary | ICD-10-CM | POA: Diagnosis not present

## 2020-09-19 DIAGNOSIS — N2581 Secondary hyperparathyroidism of renal origin: Secondary | ICD-10-CM | POA: Diagnosis not present

## 2020-09-20 DIAGNOSIS — N2581 Secondary hyperparathyroidism of renal origin: Secondary | ICD-10-CM | POA: Diagnosis not present

## 2020-09-20 DIAGNOSIS — E118 Type 2 diabetes mellitus with unspecified complications: Secondary | ICD-10-CM | POA: Diagnosis not present

## 2020-09-20 DIAGNOSIS — E7800 Pure hypercholesterolemia, unspecified: Secondary | ICD-10-CM | POA: Diagnosis not present

## 2020-09-20 DIAGNOSIS — E1159 Type 2 diabetes mellitus with other circulatory complications: Secondary | ICD-10-CM | POA: Diagnosis not present

## 2020-09-20 DIAGNOSIS — Z794 Long term (current) use of insulin: Secondary | ICD-10-CM | POA: Diagnosis not present

## 2020-09-20 DIAGNOSIS — E1142 Type 2 diabetes mellitus with diabetic polyneuropathy: Secondary | ICD-10-CM | POA: Diagnosis not present

## 2020-09-20 DIAGNOSIS — I10 Essential (primary) hypertension: Secondary | ICD-10-CM | POA: Diagnosis not present

## 2020-09-20 DIAGNOSIS — N186 End stage renal disease: Secondary | ICD-10-CM | POA: Diagnosis not present

## 2020-09-20 DIAGNOSIS — Z992 Dependence on renal dialysis: Secondary | ICD-10-CM | POA: Diagnosis not present

## 2020-09-20 DIAGNOSIS — Z0000 Encounter for general adult medical examination without abnormal findings: Secondary | ICD-10-CM | POA: Diagnosis not present

## 2020-09-20 DIAGNOSIS — Z79899 Other long term (current) drug therapy: Secondary | ICD-10-CM | POA: Diagnosis not present

## 2020-09-20 DIAGNOSIS — E1122 Type 2 diabetes mellitus with diabetic chronic kidney disease: Secondary | ICD-10-CM | POA: Diagnosis not present

## 2020-09-26 DIAGNOSIS — Z992 Dependence on renal dialysis: Secondary | ICD-10-CM | POA: Diagnosis not present

## 2020-09-26 DIAGNOSIS — N2581 Secondary hyperparathyroidism of renal origin: Secondary | ICD-10-CM | POA: Diagnosis not present

## 2020-09-26 DIAGNOSIS — N186 End stage renal disease: Secondary | ICD-10-CM | POA: Diagnosis not present

## 2020-10-03 DIAGNOSIS — N186 End stage renal disease: Secondary | ICD-10-CM | POA: Diagnosis not present

## 2020-10-03 DIAGNOSIS — Z992 Dependence on renal dialysis: Secondary | ICD-10-CM | POA: Diagnosis not present

## 2020-10-03 DIAGNOSIS — N2581 Secondary hyperparathyroidism of renal origin: Secondary | ICD-10-CM | POA: Diagnosis not present

## 2020-10-10 DIAGNOSIS — N186 End stage renal disease: Secondary | ICD-10-CM | POA: Diagnosis not present

## 2020-10-10 DIAGNOSIS — Z992 Dependence on renal dialysis: Secondary | ICD-10-CM | POA: Diagnosis not present

## 2020-10-10 DIAGNOSIS — N2581 Secondary hyperparathyroidism of renal origin: Secondary | ICD-10-CM | POA: Diagnosis not present

## 2020-10-12 DIAGNOSIS — Z23 Encounter for immunization: Secondary | ICD-10-CM | POA: Diagnosis not present

## 2020-10-12 DIAGNOSIS — N2581 Secondary hyperparathyroidism of renal origin: Secondary | ICD-10-CM | POA: Diagnosis not present

## 2020-10-12 DIAGNOSIS — Z992 Dependence on renal dialysis: Secondary | ICD-10-CM | POA: Diagnosis not present

## 2020-10-12 DIAGNOSIS — N186 End stage renal disease: Secondary | ICD-10-CM | POA: Diagnosis not present

## 2020-10-17 DIAGNOSIS — N2581 Secondary hyperparathyroidism of renal origin: Secondary | ICD-10-CM | POA: Diagnosis not present

## 2020-10-17 DIAGNOSIS — N186 End stage renal disease: Secondary | ICD-10-CM | POA: Diagnosis not present

## 2020-10-17 DIAGNOSIS — Z992 Dependence on renal dialysis: Secondary | ICD-10-CM | POA: Diagnosis not present

## 2020-10-24 DIAGNOSIS — N186 End stage renal disease: Secondary | ICD-10-CM | POA: Diagnosis not present

## 2020-10-24 DIAGNOSIS — Z992 Dependence on renal dialysis: Secondary | ICD-10-CM | POA: Diagnosis not present

## 2020-10-24 DIAGNOSIS — N2581 Secondary hyperparathyroidism of renal origin: Secondary | ICD-10-CM | POA: Diagnosis not present

## 2020-10-31 DIAGNOSIS — Z992 Dependence on renal dialysis: Secondary | ICD-10-CM | POA: Diagnosis not present

## 2020-10-31 DIAGNOSIS — N186 End stage renal disease: Secondary | ICD-10-CM | POA: Diagnosis not present

## 2020-10-31 DIAGNOSIS — N2581 Secondary hyperparathyroidism of renal origin: Secondary | ICD-10-CM | POA: Diagnosis not present

## 2020-10-31 DIAGNOSIS — D631 Anemia in chronic kidney disease: Secondary | ICD-10-CM | POA: Diagnosis not present

## 2020-11-04 ENCOUNTER — Other Ambulatory Visit (INDEPENDENT_AMBULATORY_CARE_PROVIDER_SITE_OTHER): Payer: Self-pay | Admitting: Nurse Practitioner

## 2020-11-04 DIAGNOSIS — I701 Atherosclerosis of renal artery: Secondary | ICD-10-CM

## 2020-11-07 DIAGNOSIS — Z992 Dependence on renal dialysis: Secondary | ICD-10-CM | POA: Diagnosis not present

## 2020-11-07 DIAGNOSIS — N186 End stage renal disease: Secondary | ICD-10-CM | POA: Diagnosis not present

## 2020-11-07 DIAGNOSIS — N2581 Secondary hyperparathyroidism of renal origin: Secondary | ICD-10-CM | POA: Diagnosis not present

## 2020-11-09 DIAGNOSIS — N186 End stage renal disease: Secondary | ICD-10-CM | POA: Diagnosis not present

## 2020-11-09 DIAGNOSIS — Z992 Dependence on renal dialysis: Secondary | ICD-10-CM | POA: Diagnosis not present

## 2020-11-11 DIAGNOSIS — N186 End stage renal disease: Secondary | ICD-10-CM | POA: Diagnosis not present

## 2020-11-11 DIAGNOSIS — Z992 Dependence on renal dialysis: Secondary | ICD-10-CM | POA: Diagnosis not present

## 2020-11-11 DIAGNOSIS — N2581 Secondary hyperparathyroidism of renal origin: Secondary | ICD-10-CM | POA: Diagnosis not present

## 2020-11-14 DIAGNOSIS — Z992 Dependence on renal dialysis: Secondary | ICD-10-CM | POA: Diagnosis not present

## 2020-11-14 DIAGNOSIS — D631 Anemia in chronic kidney disease: Secondary | ICD-10-CM | POA: Diagnosis not present

## 2020-11-14 DIAGNOSIS — D509 Iron deficiency anemia, unspecified: Secondary | ICD-10-CM | POA: Diagnosis not present

## 2020-11-14 DIAGNOSIS — N186 End stage renal disease: Secondary | ICD-10-CM | POA: Diagnosis not present

## 2020-11-14 DIAGNOSIS — N2581 Secondary hyperparathyroidism of renal origin: Secondary | ICD-10-CM | POA: Diagnosis not present

## 2020-11-21 DIAGNOSIS — D509 Iron deficiency anemia, unspecified: Secondary | ICD-10-CM | POA: Diagnosis not present

## 2020-11-21 DIAGNOSIS — N2581 Secondary hyperparathyroidism of renal origin: Secondary | ICD-10-CM | POA: Diagnosis not present

## 2020-11-21 DIAGNOSIS — N186 End stage renal disease: Secondary | ICD-10-CM | POA: Diagnosis not present

## 2020-11-21 DIAGNOSIS — Z992 Dependence on renal dialysis: Secondary | ICD-10-CM | POA: Diagnosis not present

## 2020-11-28 DIAGNOSIS — N186 End stage renal disease: Secondary | ICD-10-CM | POA: Diagnosis not present

## 2020-11-28 DIAGNOSIS — N2581 Secondary hyperparathyroidism of renal origin: Secondary | ICD-10-CM | POA: Diagnosis not present

## 2020-11-28 DIAGNOSIS — D509 Iron deficiency anemia, unspecified: Secondary | ICD-10-CM | POA: Diagnosis not present

## 2020-11-28 DIAGNOSIS — Z992 Dependence on renal dialysis: Secondary | ICD-10-CM | POA: Diagnosis not present

## 2020-11-29 DIAGNOSIS — H35341 Macular cyst, hole, or pseudohole, right eye: Secondary | ICD-10-CM | POA: Diagnosis not present

## 2020-12-04 DIAGNOSIS — Z992 Dependence on renal dialysis: Secondary | ICD-10-CM | POA: Diagnosis not present

## 2020-12-04 DIAGNOSIS — M25511 Pain in right shoulder: Secondary | ICD-10-CM | POA: Diagnosis not present

## 2020-12-04 DIAGNOSIS — M545 Low back pain, unspecified: Secondary | ICD-10-CM | POA: Diagnosis not present

## 2020-12-04 DIAGNOSIS — N186 End stage renal disease: Secondary | ICD-10-CM | POA: Diagnosis not present

## 2020-12-05 DIAGNOSIS — N2581 Secondary hyperparathyroidism of renal origin: Secondary | ICD-10-CM | POA: Diagnosis not present

## 2020-12-05 DIAGNOSIS — Z992 Dependence on renal dialysis: Secondary | ICD-10-CM | POA: Diagnosis not present

## 2020-12-05 DIAGNOSIS — D509 Iron deficiency anemia, unspecified: Secondary | ICD-10-CM | POA: Diagnosis not present

## 2020-12-05 DIAGNOSIS — N186 End stage renal disease: Secondary | ICD-10-CM | POA: Diagnosis not present

## 2020-12-10 DIAGNOSIS — Z992 Dependence on renal dialysis: Secondary | ICD-10-CM | POA: Diagnosis not present

## 2020-12-10 DIAGNOSIS — N186 End stage renal disease: Secondary | ICD-10-CM | POA: Diagnosis not present

## 2020-12-12 DIAGNOSIS — D509 Iron deficiency anemia, unspecified: Secondary | ICD-10-CM | POA: Diagnosis not present

## 2020-12-12 DIAGNOSIS — Z992 Dependence on renal dialysis: Secondary | ICD-10-CM | POA: Diagnosis not present

## 2020-12-12 DIAGNOSIS — N2581 Secondary hyperparathyroidism of renal origin: Secondary | ICD-10-CM | POA: Diagnosis not present

## 2020-12-12 DIAGNOSIS — N186 End stage renal disease: Secondary | ICD-10-CM | POA: Diagnosis not present

## 2020-12-12 DIAGNOSIS — D631 Anemia in chronic kidney disease: Secondary | ICD-10-CM | POA: Diagnosis not present

## 2020-12-19 DIAGNOSIS — N2581 Secondary hyperparathyroidism of renal origin: Secondary | ICD-10-CM | POA: Diagnosis not present

## 2020-12-19 DIAGNOSIS — N186 End stage renal disease: Secondary | ICD-10-CM | POA: Diagnosis not present

## 2020-12-19 DIAGNOSIS — Z992 Dependence on renal dialysis: Secondary | ICD-10-CM | POA: Diagnosis not present

## 2020-12-21 DIAGNOSIS — K625 Hemorrhage of anus and rectum: Secondary | ICD-10-CM | POA: Diagnosis not present

## 2020-12-26 DIAGNOSIS — D631 Anemia in chronic kidney disease: Secondary | ICD-10-CM | POA: Diagnosis not present

## 2020-12-26 DIAGNOSIS — N2581 Secondary hyperparathyroidism of renal origin: Secondary | ICD-10-CM | POA: Diagnosis not present

## 2020-12-26 DIAGNOSIS — Z992 Dependence on renal dialysis: Secondary | ICD-10-CM | POA: Diagnosis not present

## 2020-12-26 DIAGNOSIS — N186 End stage renal disease: Secondary | ICD-10-CM | POA: Diagnosis not present

## 2021-01-02 DIAGNOSIS — N186 End stage renal disease: Secondary | ICD-10-CM | POA: Diagnosis not present

## 2021-01-02 DIAGNOSIS — N2581 Secondary hyperparathyroidism of renal origin: Secondary | ICD-10-CM | POA: Diagnosis not present

## 2021-01-02 DIAGNOSIS — Z992 Dependence on renal dialysis: Secondary | ICD-10-CM | POA: Diagnosis not present

## 2021-01-09 DIAGNOSIS — N186 End stage renal disease: Secondary | ICD-10-CM | POA: Diagnosis not present

## 2021-01-09 DIAGNOSIS — Z992 Dependence on renal dialysis: Secondary | ICD-10-CM | POA: Diagnosis not present

## 2021-01-09 DIAGNOSIS — N2581 Secondary hyperparathyroidism of renal origin: Secondary | ICD-10-CM | POA: Diagnosis not present

## 2021-01-10 DIAGNOSIS — N186 End stage renal disease: Secondary | ICD-10-CM | POA: Diagnosis not present

## 2021-01-10 DIAGNOSIS — Z992 Dependence on renal dialysis: Secondary | ICD-10-CM | POA: Diagnosis not present

## 2021-01-11 DIAGNOSIS — Z992 Dependence on renal dialysis: Secondary | ICD-10-CM | POA: Diagnosis not present

## 2021-01-11 DIAGNOSIS — N186 End stage renal disease: Secondary | ICD-10-CM | POA: Diagnosis not present

## 2021-01-11 DIAGNOSIS — N2581 Secondary hyperparathyroidism of renal origin: Secondary | ICD-10-CM | POA: Diagnosis not present

## 2021-01-11 DIAGNOSIS — D631 Anemia in chronic kidney disease: Secondary | ICD-10-CM | POA: Diagnosis not present

## 2021-01-16 DIAGNOSIS — N2581 Secondary hyperparathyroidism of renal origin: Secondary | ICD-10-CM | POA: Diagnosis not present

## 2021-01-16 DIAGNOSIS — N186 End stage renal disease: Secondary | ICD-10-CM | POA: Diagnosis not present

## 2021-01-16 DIAGNOSIS — E1122 Type 2 diabetes mellitus with diabetic chronic kidney disease: Secondary | ICD-10-CM | POA: Diagnosis not present

## 2021-01-16 DIAGNOSIS — Z992 Dependence on renal dialysis: Secondary | ICD-10-CM | POA: Diagnosis not present

## 2021-01-23 DIAGNOSIS — D509 Iron deficiency anemia, unspecified: Secondary | ICD-10-CM | POA: Diagnosis not present

## 2021-01-23 DIAGNOSIS — Z992 Dependence on renal dialysis: Secondary | ICD-10-CM | POA: Diagnosis not present

## 2021-01-23 DIAGNOSIS — D631 Anemia in chronic kidney disease: Secondary | ICD-10-CM | POA: Diagnosis not present

## 2021-01-23 DIAGNOSIS — N186 End stage renal disease: Secondary | ICD-10-CM | POA: Diagnosis not present

## 2021-01-23 DIAGNOSIS — N2581 Secondary hyperparathyroidism of renal origin: Secondary | ICD-10-CM | POA: Diagnosis not present

## 2021-01-30 DIAGNOSIS — N186 End stage renal disease: Secondary | ICD-10-CM | POA: Diagnosis not present

## 2021-01-30 DIAGNOSIS — N2581 Secondary hyperparathyroidism of renal origin: Secondary | ICD-10-CM | POA: Diagnosis not present

## 2021-01-30 DIAGNOSIS — D509 Iron deficiency anemia, unspecified: Secondary | ICD-10-CM | POA: Diagnosis not present

## 2021-01-30 DIAGNOSIS — Z992 Dependence on renal dialysis: Secondary | ICD-10-CM | POA: Diagnosis not present

## 2021-02-06 DIAGNOSIS — N2581 Secondary hyperparathyroidism of renal origin: Secondary | ICD-10-CM | POA: Diagnosis not present

## 2021-02-06 DIAGNOSIS — D509 Iron deficiency anemia, unspecified: Secondary | ICD-10-CM | POA: Diagnosis not present

## 2021-02-06 DIAGNOSIS — N186 End stage renal disease: Secondary | ICD-10-CM | POA: Diagnosis not present

## 2021-02-06 DIAGNOSIS — D631 Anemia in chronic kidney disease: Secondary | ICD-10-CM | POA: Diagnosis not present

## 2021-02-06 DIAGNOSIS — Z992 Dependence on renal dialysis: Secondary | ICD-10-CM | POA: Diagnosis not present

## 2021-02-09 DIAGNOSIS — N186 End stage renal disease: Secondary | ICD-10-CM | POA: Diagnosis not present

## 2021-02-09 DIAGNOSIS — Z992 Dependence on renal dialysis: Secondary | ICD-10-CM | POA: Diagnosis not present

## 2021-02-10 DIAGNOSIS — Z992 Dependence on renal dialysis: Secondary | ICD-10-CM | POA: Diagnosis not present

## 2021-02-10 DIAGNOSIS — N2581 Secondary hyperparathyroidism of renal origin: Secondary | ICD-10-CM | POA: Diagnosis not present

## 2021-02-10 DIAGNOSIS — D509 Iron deficiency anemia, unspecified: Secondary | ICD-10-CM | POA: Diagnosis not present

## 2021-02-10 DIAGNOSIS — N186 End stage renal disease: Secondary | ICD-10-CM | POA: Diagnosis not present

## 2021-02-13 DIAGNOSIS — N2581 Secondary hyperparathyroidism of renal origin: Secondary | ICD-10-CM | POA: Diagnosis not present

## 2021-02-13 DIAGNOSIS — N186 End stage renal disease: Secondary | ICD-10-CM | POA: Diagnosis not present

## 2021-02-13 DIAGNOSIS — Z992 Dependence on renal dialysis: Secondary | ICD-10-CM | POA: Diagnosis not present

## 2021-02-13 DIAGNOSIS — D509 Iron deficiency anemia, unspecified: Secondary | ICD-10-CM | POA: Diagnosis not present

## 2021-02-20 DIAGNOSIS — N186 End stage renal disease: Secondary | ICD-10-CM | POA: Diagnosis not present

## 2021-02-20 DIAGNOSIS — Z992 Dependence on renal dialysis: Secondary | ICD-10-CM | POA: Diagnosis not present

## 2021-02-20 DIAGNOSIS — D509 Iron deficiency anemia, unspecified: Secondary | ICD-10-CM | POA: Diagnosis not present

## 2021-02-20 DIAGNOSIS — N2581 Secondary hyperparathyroidism of renal origin: Secondary | ICD-10-CM | POA: Diagnosis not present

## 2021-02-27 DIAGNOSIS — N186 End stage renal disease: Secondary | ICD-10-CM | POA: Diagnosis not present

## 2021-02-27 DIAGNOSIS — N2581 Secondary hyperparathyroidism of renal origin: Secondary | ICD-10-CM | POA: Diagnosis not present

## 2021-02-27 DIAGNOSIS — Z992 Dependence on renal dialysis: Secondary | ICD-10-CM | POA: Diagnosis not present

## 2021-02-28 ENCOUNTER — Other Ambulatory Visit (INDEPENDENT_AMBULATORY_CARE_PROVIDER_SITE_OTHER): Payer: Self-pay | Admitting: Nurse Practitioner

## 2021-02-28 DIAGNOSIS — I701 Atherosclerosis of renal artery: Secondary | ICD-10-CM

## 2021-03-02 ENCOUNTER — Inpatient Hospital Stay: Payer: PPO | Attending: Oncology

## 2021-03-02 ENCOUNTER — Other Ambulatory Visit: Payer: Self-pay

## 2021-03-02 DIAGNOSIS — D472 Monoclonal gammopathy: Secondary | ICD-10-CM | POA: Diagnosis not present

## 2021-03-02 DIAGNOSIS — N185 Chronic kidney disease, stage 5: Secondary | ICD-10-CM | POA: Diagnosis not present

## 2021-03-02 DIAGNOSIS — Z85038 Personal history of other malignant neoplasm of large intestine: Secondary | ICD-10-CM | POA: Insufficient documentation

## 2021-03-02 DIAGNOSIS — D631 Anemia in chronic kidney disease: Secondary | ICD-10-CM | POA: Diagnosis not present

## 2021-03-02 DIAGNOSIS — I120 Hypertensive chronic kidney disease with stage 5 chronic kidney disease or end stage renal disease: Secondary | ICD-10-CM | POA: Diagnosis not present

## 2021-03-02 LAB — CBC WITH DIFFERENTIAL/PLATELET
Abs Immature Granulocytes: 0.01 10*3/uL (ref 0.00–0.07)
Basophils Absolute: 0 10*3/uL (ref 0.0–0.1)
Basophils Relative: 1 %
Eosinophils Absolute: 0.1 10*3/uL (ref 0.0–0.5)
Eosinophils Relative: 2 %
HCT: 37 % — ABNORMAL LOW (ref 39.0–52.0)
Hemoglobin: 12.5 g/dL — ABNORMAL LOW (ref 13.0–17.0)
Immature Granulocytes: 0 %
Lymphocytes Relative: 20 %
Lymphs Abs: 1.3 10*3/uL (ref 0.7–4.0)
MCH: 34 pg (ref 26.0–34.0)
MCHC: 33.8 g/dL (ref 30.0–36.0)
MCV: 100.5 fL — ABNORMAL HIGH (ref 80.0–100.0)
Monocytes Absolute: 0.7 10*3/uL (ref 0.1–1.0)
Monocytes Relative: 11 %
Neutro Abs: 4.2 10*3/uL (ref 1.7–7.7)
Neutrophils Relative %: 66 %
Platelets: 137 10*3/uL — ABNORMAL LOW (ref 150–400)
RBC: 3.68 MIL/uL — ABNORMAL LOW (ref 4.22–5.81)
RDW: 14.6 % (ref 11.5–15.5)
WBC: 6.3 10*3/uL (ref 4.0–10.5)
nRBC: 0 % (ref 0.0–0.2)

## 2021-03-03 LAB — CEA: CEA: 1.8 ng/mL (ref 0.0–4.7)

## 2021-03-06 DIAGNOSIS — N186 End stage renal disease: Secondary | ICD-10-CM | POA: Diagnosis not present

## 2021-03-06 DIAGNOSIS — N2581 Secondary hyperparathyroidism of renal origin: Secondary | ICD-10-CM | POA: Diagnosis not present

## 2021-03-06 DIAGNOSIS — Z992 Dependence on renal dialysis: Secondary | ICD-10-CM | POA: Diagnosis not present

## 2021-03-06 LAB — MULTIPLE MYELOMA PANEL, SERUM
Albumin SerPl Elph-Mcnc: 4 g/dL (ref 2.9–4.4)
Albumin/Glob SerPl: 1.7 (ref 0.7–1.7)
Alpha 1: 0.2 g/dL (ref 0.0–0.4)
Alpha2 Glob SerPl Elph-Mcnc: 0.7 g/dL (ref 0.4–1.0)
B-Globulin SerPl Elph-Mcnc: 0.8 g/dL (ref 0.7–1.3)
Gamma Glob SerPl Elph-Mcnc: 0.8 g/dL (ref 0.4–1.8)
Globulin, Total: 2.5 g/dL (ref 2.2–3.9)
IgA: 323 mg/dL (ref 61–437)
IgG (Immunoglobin G), Serum: 810 mg/dL (ref 603–1613)
IgM (Immunoglobulin M), Srm: 68 mg/dL (ref 15–143)
M Protein SerPl Elph-Mcnc: 0.3 g/dL — ABNORMAL HIGH
Total Protein ELP: 6.5 g/dL (ref 6.0–8.5)

## 2021-03-09 ENCOUNTER — Inpatient Hospital Stay: Payer: PPO | Admitting: Oncology

## 2021-03-09 ENCOUNTER — Encounter: Payer: Self-pay | Admitting: Oncology

## 2021-03-09 ENCOUNTER — Other Ambulatory Visit: Payer: Self-pay

## 2021-03-09 VITALS — BP 143/50 | HR 67 | Temp 98.1°F | Resp 18 | Wt 138.4 lb

## 2021-03-09 DIAGNOSIS — D472 Monoclonal gammopathy: Secondary | ICD-10-CM

## 2021-03-09 DIAGNOSIS — D631 Anemia in chronic kidney disease: Secondary | ICD-10-CM | POA: Diagnosis not present

## 2021-03-09 DIAGNOSIS — Z85038 Personal history of other malignant neoplasm of large intestine: Secondary | ICD-10-CM | POA: Diagnosis not present

## 2021-03-09 DIAGNOSIS — N185 Chronic kidney disease, stage 5: Secondary | ICD-10-CM

## 2021-03-09 NOTE — Progress Notes (Signed)
Hematology/Oncology follow up note [facility] [phone]) [phone] [phone]   Patient Care Team: [name], MD as PCP - General (Internal Medicine)  REFERRING PROVIDER: [name], MD  CHIEF COMPLAINTS/REASON FOR VISIT:  Follow-up for anemia in chronic kidney disease.  HISTORY OF PRESENTING ILLNESS:   Dylan Sanders is a  87 y.o.  male with PMH listed below was seen in consultation at the request of  [name], MD  for evaluation of anemia and chronic kidney disease. Patient has chronic kidney disease, secondary to longstanding high blood pressure.  Follows up with nephrology. He has had AV graft placed and is matured.  Last seen by nephrology on 02/23/2019, no indication to start dialysis yet. 02/11/2019, hemoglobin 8.5, hematocrit 26.3, MCV 102.3, Creatinine 3.86, EGFR 15. Negative hepatitis panel Patient was referred to heme-onc for evaluation of anemia in the setting of chronic kidney disease. Patient reports feeling well.  Chronic fatigue at baseline. Appetite is fair. Denies weight loss, fever, chills, fatigue, night sweats.  #Reported remote history of colon cancer 2008.  Status post resection. #Diarrhea, chronic.  No blood in the stool.  INTERVAL HISTORY Dylan Sanders is a 87 y.o. male who has above history reviewed by me today presents for follow up visit for management of anemia and chronic disease. Problems and complaints are listed below:  On HD with IV iron and EPO treatment managed by nephrology.  Patient reports that he is feeling great.  Good energy level.  Review of Systems  Constitutional:  Negative for appetite change, chills, fatigue, fever and unexpected weight change.  HENT:   Negative for hearing loss and voice change.   Eyes:  Negative for eye problems and icterus.  Respiratory:  Negative for chest tightness, cough and shortness of breath.   Cardiovascular:  Negative for chest pain and leg  swelling.  Gastrointestinal:  Negative for abdominal distention and abdominal pain.  Endocrine: Negative for hot flashes.  Genitourinary:  Negative for difficulty urinating, dysuria and frequency.   Musculoskeletal:  Negative for arthralgias.  Skin:  Negative for itching and rash.  Neurological:  Negative for light-headedness and numbness.  Hematological:  Negative for adenopathy. Does not bruise/bleed easily.  Psychiatric/Behavioral:  Negative for confusion.    MEDICAL HISTORY:  Past Medical History:  Diagnosis Date   Anemia    Cancer ([place]) 2008   colon   Chronic kidney disease    Diabetes mellitus without complication ([place])    Heart murmur    asymptomatic   Hypertension     SURGICAL HISTORY: Past Surgical History:  Procedure Laterality Date   A/V SHUNT INTERVENTION Left 07/27/2019   Procedure: A/V SHUNT INTERVENTION;  Surgeon: Katha Cabal, MD;  Location: [place] [facility];  Service: Cardiovascular;  Laterality: Left;   AV FISTULA PLACEMENT Left 01/08/2019   Procedure: INSERTION OF ARTERIOVENOUS (AV) GORE-TEX GRAFT ARM ( BRACHIAL AXILLARY );  Surgeon: Katha Cabal, MD;  Location: [facility];  Service: Vascular;  Laterality: Left;   COLON SURGERY     EYE SURGERY     bilateral   RENAL ANGIOGRAPHY Bilateral 11/19/2016   Procedure: Renal Angiography;  Surgeon: Katha Cabal, MD;  Location: [place] [facility];  Service: Cardiovascular;  Laterality: Bilateral;   RENAL ANGIOGRAPHY Right 07/03/2018   Procedure: RENAL ANGIOGRAPHY;  Surgeon: Katha Cabal, MD;  Location: [place] [facility];  Service: Cardiovascular;  Laterality: Right;   REVERSE SHOULDER ARTHROPLASTY Left 09/19/2016   Procedure:  REVERSE SHOULDER ARTHROPLASTY;  Surgeon: Corky Mull, MD;  Location: [facility];  Service: Orthopedics;  Laterality: Left;   ROTATOR CUFF REPAIR Right     SOCIAL HISTORY: Social History   Socioeconomic History   Marital status: Widowed    Spouse name: Not on file    Number of children: Not on file   Years of education: Not on file   Highest education level: Not on file  Occupational History   Not on file  Tobacco Use   Smoking status: Former    Years: 25.00    Types: Pipe, Cigarettes, Cigars    Quit date: 09/05/1976    Years since quitting: 44.5   Smokeless tobacco: Never  Vaping Use   Vaping Use: Never used  Substance and Sexual Activity   Alcohol use: Yes    Comment: ocassionally   Drug use: No   Sexual activity: Never  Other Topics Concern   Not on file  Social History Narrative   Not on file   Social Determinants of Health   Financial Resource Strain: Not on file  Food Insecurity: Not on file  Transportation Needs: Not on file  Physical Activity: Not on file  Stress: Not on file  Social Connections: Not on file  Intimate Partner Violence: Not on file    FAMILY HISTORY: Family History  Problem Relation Age of Onset   Cancer Father     ALLERGIES:  is allergic to ace inhibitors.  MEDICATIONS:  Current Outpatient Medications  Medication Sig Dispense Refill   allopurinol (ZYLOPRIM) 100 MG tablet Take 100 mg by mouth at bedtime.      amLODipine (NORVASC) 10 MG tablet Take 10 mg by mouth every morning.      aspirin EC 81 MG tablet Take 81 mg by mouth at bedtime.      B Complex-C-Folic Acid (RENA-VITE RX) 1 MG TABS Take 1 tablet by mouth daily.     carvedilol (COREG) 3.125 MG tablet TAKE 1 TABLET BY MOUTH TWICE DAILY WITH MEALS 180 tablet 3   cloNIDine (CATAPRES) 0.1 MG tablet Take 0.1 mg by mouth 2 (two) times daily.      clopidogrel (PLAVIX) 75 MG tablet Take 1 tablet by mouth once daily 90 tablet 0   ferrous sulfate 325 (65 FE) MG EC tablet Take 1 tablet (325 mg total) by mouth 2 (two) times daily. 90 tablet 1   LANTUS SOLOSTAR 100 UNIT/ML Solostar Pen Inject 10 Units into the skin every morning.      lidocaine-prilocaine (EMLA) cream SMARTSIG:Sparingly Topical 3 Times a Week     losartan (COZAAR) 100 MG tablet Take by  mouth.     psyllium (METAMUCIL) 58.6 % powder Take 1 packet by mouth daily.     sevelamer (RENAGEL) 800 MG tablet Take by mouth.     sevelamer carbonate (RENVELA) 800 MG tablet Take 800 mg by mouth 3 (three) times daily.     simvastatin (ZOCOR) 20 MG tablet Take 20 mg by mouth at bedtime.     telmisartan (MICARDIS) 80 MG tablet Take 80 mg by mouth every morning.      traZODone (DESYREL) 50 MG tablet Take by mouth.     vitamin B-12 (CYANOCOBALAMIN) 1000 MCG tablet Take 1 tablet (1,000 mcg total) by mouth daily. 90 tablet 0   vitamin C (ASCORBIC ACID) 500 MG tablet Take 1 tablet (500 mg total) by mouth daily. 90 tablet 0   No current facility-administered medications for this visit.  PHYSICAL EXAMINATION: ECOG PERFORMANCE STATUS: 1 - Symptomatic but completely ambulatory Vitals:   03/09/21 1002  BP: (!) 143/50  Pulse: 67  Resp: 18  Temp: 98.1 F (36.7 C)   Filed Weights   03/09/21 1002  Weight: 138 lb 6.4 oz (62.8 kg)    Physical Exam Constitutional:      General: He is not in acute distress. HENT:     Head: Normocephalic and atraumatic.  Eyes:     General: No scleral icterus. Cardiovascular:     Rate and Rhythm: Normal rate and regular rhythm.     Heart sounds: Murmur heard.  Pulmonary:     Effort: Pulmonary effort is normal. No respiratory distress.     Breath sounds: No wheezing.  Abdominal:     General: Bowel sounds are normal. There is no distension.     Palpations: Abdomen is soft.  Musculoskeletal:        General: No deformity. Normal range of motion.     Cervical back: Normal range of motion and neck supple.  Skin:    General: Skin is warm and dry.     Findings: No erythema or rash.  Neurological:     Mental Status: He is alert and oriented to person, place, and time. Mental status is at baseline.     Cranial Nerves: No cranial nerve deficit.     Coordination: Coordination normal.  Psychiatric:        Mood and Affect: Mood normal.    LABORATORY  DATA:  I have reviewed the data as listed Lab Results  Component Value Date   WBC 6.3 03/02/2021   HGB 12.5 (L) 03/02/2021   HCT 37.0 (L) 03/02/2021   MCV 100.5 (H) 03/02/2021   PLT 137 (L) 03/02/2021   No results for input(s): NA, K, CL, CO2, GLUCOSE, BUN, CREATININE, CALCIUM, GFRNONAA, GFRAA, PROT, ALBUMIN, AST, ALT, ALKPHOS, BILITOT, BILIDIR, IBILI in the last 8760 hours.  Iron/TIBC/Ferritin/ %Sat    Component Value Date/Time   IRON 68 08/25/2019 1256   TIBC 225 (L) 08/25/2019 1256   FERRITIN 362 (H) 08/25/2019 1256   IRONPCTSAT 30 08/25/2019 1256      RADIOGRAPHIC STUDIES: I have personally reviewed the radiological images as listed and agreed with the findings in the report.  No results found.     ASSESSMENT & PLAN:  1. MGUS (monoclonal gammopathy of unknown significance)   2. History of colon cancer   3. Anemia in stage 5 chronic kidney disease, not on chronic dialysis (HCC)    IgA kappa and IgG kappa MGUS Labs are reviewed and discussed with patient M protein is stable at 1.3. He has 2 clones of plasma cells. Continue monitor and follow-up once a year  # Anemia in chronic kidney disease.  his IV iron and EPO per nephrologist. Follow up with Dr.Kolluro.  Recent labs done at her nephrologist office showed a ferritin of over 1000.  #Remote history of colon cancer.  CEA is normal.    Orders Placed This Encounter  Procedures   CEA    Standing Status:   Future    Standing Expiration Date:   03/09/2022   CBC with Differential/Platelet    Standing Status:   Future    Standing Expiration Date:   03/09/2022   Multiple Myeloma Panel (SPEP&IFE w/QIG)    Standing Status:   Future    Standing Expiration Date:   03/09/2022   Kappa/lambda light chains    Standing Status:   Future  Standing Expiration Date:   03/09/2022    All questions were answered. The patient knows to call the clinic with any problems questions or concerns.  cc [name], MD     Return of visit: 12 months.    [name], MD, PhD Hematology Oncology  03/09/2021

## 2021-03-09 NOTE — Progress Notes (Signed)
Pt here for follow up. No new concerns voiced.   

## 2021-03-12 DIAGNOSIS — Z992 Dependence on renal dialysis: Secondary | ICD-10-CM | POA: Diagnosis not present

## 2021-03-12 DIAGNOSIS — N186 End stage renal disease: Secondary | ICD-10-CM | POA: Diagnosis not present

## 2021-03-13 DIAGNOSIS — N186 End stage renal disease: Secondary | ICD-10-CM | POA: Diagnosis not present

## 2021-03-13 DIAGNOSIS — N2581 Secondary hyperparathyroidism of renal origin: Secondary | ICD-10-CM | POA: Diagnosis not present

## 2021-03-13 DIAGNOSIS — Z992 Dependence on renal dialysis: Secondary | ICD-10-CM | POA: Diagnosis not present

## 2021-03-20 DIAGNOSIS — N2581 Secondary hyperparathyroidism of renal origin: Secondary | ICD-10-CM | POA: Diagnosis not present

## 2021-03-20 DIAGNOSIS — Z992 Dependence on renal dialysis: Secondary | ICD-10-CM | POA: Diagnosis not present

## 2021-03-20 DIAGNOSIS — D509 Iron deficiency anemia, unspecified: Secondary | ICD-10-CM | POA: Diagnosis not present

## 2021-03-20 DIAGNOSIS — D631 Anemia in chronic kidney disease: Secondary | ICD-10-CM | POA: Diagnosis not present

## 2021-03-20 DIAGNOSIS — N186 End stage renal disease: Secondary | ICD-10-CM | POA: Diagnosis not present

## 2021-03-22 DIAGNOSIS — R0602 Shortness of breath: Secondary | ICD-10-CM | POA: Diagnosis not present

## 2021-03-22 DIAGNOSIS — Z1152 Encounter for screening for COVID-19: Secondary | ICD-10-CM | POA: Diagnosis not present

## 2021-03-23 DIAGNOSIS — I10 Essential (primary) hypertension: Secondary | ICD-10-CM | POA: Diagnosis not present

## 2021-03-23 DIAGNOSIS — Z79899 Other long term (current) drug therapy: Secondary | ICD-10-CM | POA: Diagnosis not present

## 2021-03-23 DIAGNOSIS — E118 Type 2 diabetes mellitus with unspecified complications: Secondary | ICD-10-CM | POA: Diagnosis not present

## 2021-03-23 DIAGNOSIS — E7800 Pure hypercholesterolemia, unspecified: Secondary | ICD-10-CM | POA: Diagnosis not present

## 2021-03-27 DIAGNOSIS — N186 End stage renal disease: Secondary | ICD-10-CM | POA: Diagnosis not present

## 2021-03-27 DIAGNOSIS — N2581 Secondary hyperparathyroidism of renal origin: Secondary | ICD-10-CM | POA: Diagnosis not present

## 2021-03-27 DIAGNOSIS — Z992 Dependence on renal dialysis: Secondary | ICD-10-CM | POA: Diagnosis not present

## 2021-03-27 DIAGNOSIS — D509 Iron deficiency anemia, unspecified: Secondary | ICD-10-CM | POA: Diagnosis not present

## 2021-03-30 DIAGNOSIS — D689 Coagulation defect, unspecified: Secondary | ICD-10-CM | POA: Diagnosis not present

## 2021-03-30 DIAGNOSIS — I10 Essential (primary) hypertension: Secondary | ICD-10-CM | POA: Diagnosis not present

## 2021-03-30 DIAGNOSIS — I359 Nonrheumatic aortic valve disorder, unspecified: Secondary | ICD-10-CM | POA: Diagnosis not present

## 2021-03-30 DIAGNOSIS — E118 Type 2 diabetes mellitus with unspecified complications: Secondary | ICD-10-CM | POA: Diagnosis not present

## 2021-03-30 DIAGNOSIS — Z0000 Encounter for general adult medical examination without abnormal findings: Secondary | ICD-10-CM | POA: Diagnosis not present

## 2021-03-30 DIAGNOSIS — E7800 Pure hypercholesterolemia, unspecified: Secondary | ICD-10-CM | POA: Diagnosis not present

## 2021-03-30 DIAGNOSIS — E1122 Type 2 diabetes mellitus with diabetic chronic kidney disease: Secondary | ICD-10-CM | POA: Diagnosis not present

## 2021-03-30 DIAGNOSIS — R059 Cough, unspecified: Secondary | ICD-10-CM | POA: Diagnosis not present

## 2021-03-30 DIAGNOSIS — M25511 Pain in right shoulder: Secondary | ICD-10-CM | POA: Diagnosis not present

## 2021-03-30 DIAGNOSIS — E1159 Type 2 diabetes mellitus with other circulatory complications: Secondary | ICD-10-CM | POA: Diagnosis not present

## 2021-03-30 DIAGNOSIS — Z992 Dependence on renal dialysis: Secondary | ICD-10-CM | POA: Diagnosis not present

## 2021-03-30 DIAGNOSIS — Z794 Long term (current) use of insulin: Secondary | ICD-10-CM | POA: Diagnosis not present

## 2021-03-30 DIAGNOSIS — N186 End stage renal disease: Secondary | ICD-10-CM | POA: Diagnosis not present

## 2021-03-30 DIAGNOSIS — E1142 Type 2 diabetes mellitus with diabetic polyneuropathy: Secondary | ICD-10-CM | POA: Diagnosis not present

## 2021-03-30 DIAGNOSIS — R0989 Other specified symptoms and signs involving the circulatory and respiratory systems: Secondary | ICD-10-CM | POA: Diagnosis not present

## 2021-04-02 ENCOUNTER — Encounter: Payer: Self-pay | Admitting: Cardiovascular Disease

## 2021-04-02 ENCOUNTER — Other Ambulatory Visit: Payer: Self-pay

## 2021-04-02 ENCOUNTER — Ambulatory Visit: Payer: PPO | Admitting: Cardiovascular Disease

## 2021-04-02 VITALS — BP 160/60 | HR 62 | Ht 68.0 in | Wt 143.1 lb

## 2021-04-02 DIAGNOSIS — I701 Atherosclerosis of renal artery: Secondary | ICD-10-CM

## 2021-04-02 DIAGNOSIS — I2510 Atherosclerotic heart disease of native coronary artery without angina pectoris: Secondary | ICD-10-CM

## 2021-04-02 DIAGNOSIS — I120 Hypertensive chronic kidney disease with stage 5 chronic kidney disease or end stage renal disease: Secondary | ICD-10-CM

## 2021-04-02 DIAGNOSIS — E1159 Type 2 diabetes mellitus with other circulatory complications: Secondary | ICD-10-CM | POA: Diagnosis not present

## 2021-04-02 DIAGNOSIS — N186 End stage renal disease: Secondary | ICD-10-CM | POA: Diagnosis not present

## 2021-04-02 DIAGNOSIS — E782 Mixed hyperlipidemia: Secondary | ICD-10-CM

## 2021-04-02 DIAGNOSIS — I359 Nonrheumatic aortic valve disorder, unspecified: Secondary | ICD-10-CM | POA: Diagnosis not present

## 2021-04-02 DIAGNOSIS — I10 Essential (primary) hypertension: Secondary | ICD-10-CM

## 2021-04-02 DIAGNOSIS — I700 Atherosclerosis of aorta: Secondary | ICD-10-CM | POA: Diagnosis not present

## 2021-04-02 DIAGNOSIS — E7800 Pure hypercholesterolemia, unspecified: Secondary | ICD-10-CM | POA: Diagnosis not present

## 2021-04-02 DIAGNOSIS — Z992 Dependence on renal dialysis: Secondary | ICD-10-CM

## 2021-04-02 MED ORDER — AMLODIPINE BESYLATE 10 MG PO TABS: 10.0000 mg | ORAL_TABLET | ORAL | 3 refills | Status: AC

## 2021-04-02 MED ORDER — CARVEDILOL 3.125 MG PO TABS: 3.1250 mg | ORAL_TABLET | Freq: Two times a day (BID) | ORAL | 3 refills | Status: AC

## 2021-04-02 MED ORDER — TELMISARTAN 80 MG PO TABS: 80.0000 mg | ORAL_TABLET | ORAL | 3 refills | Status: AC

## 2021-04-02 MED ORDER — CLOPIDOGREL BISULFATE 75 MG PO TABS: 75.0000 mg | ORAL_TABLET | Freq: Every day | ORAL | 3 refills | Status: AC

## 2021-04-02 MED ORDER — CLONIDINE HCL 0.1 MG PO TABS: 0.1000 mg | ORAL_TABLET | Freq: Two times a day (BID) | ORAL | 3 refills | Status: AC

## 2021-04-02 MED ORDER — LOSARTAN POTASSIUM 100 MG PO TABS: 100.0000 mg | ORAL_TABLET | Freq: Every day | ORAL | 3 refills | Status: AC

## 2021-04-02 MED ORDER — SIMVASTATIN 20 MG PO TABS: 20.0000 mg | ORAL_TABLET | Freq: Every day | ORAL | 3 refills | Status: AC
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
## 2021-04-02 NOTE — Patient Instructions (Addendum)
Medication Instructions:  No changes  If you need a refill on your cardiac medications before your next appointment, please call your pharmacy.   Lab work: No new labs needed  Testing/Procedures: ECHO Your physician has requested that you have an echocardiogram. Echocardiography is a painless test that uses sound waves to create images of your heart. It provides your doctor with information about the size and shape of your heart and how well your heart's chambers and valves are working. This procedure takes approximately one hour. There are no restrictions for this procedure.  There is a possibility that an IV may need to be started during your test to inject an image enhancing agent. This is done to obtain more optimal pictures of your heart. Therefore we ask that you do at least drink some water prior to coming in to hydrate your veins.    Follow-Up: At [facility], you and your health needs are our priority.  As part of our continuing mission to provide you with exceptional heart care, we have created designated Provider Care Teams.  These Care Teams include your primary Cardiologist (physician) and Advanced Practice Providers (APPs -  Physician Assistants and Nurse Practitioners) who all work together to provide you with the care you need, when you need it.  You will need a follow up appointment in 12 months  Providers on your designated Care Team:   Murray Hodgkins, NP Christell Faith, PA-C Cadence Kathlen Mody, [place]  COVID-19 Vaccine Information can be found at: [email or web address].uk For questions related to vaccine distribution or appointments, please email [email or web address] [email or web address] or call 4508107372.

## 2021-04-03 DIAGNOSIS — D509 Iron deficiency anemia, unspecified: Secondary | ICD-10-CM | POA: Diagnosis not present

## 2021-04-03 DIAGNOSIS — N2581 Secondary hyperparathyroidism of renal origin: Secondary | ICD-10-CM | POA: Diagnosis not present

## 2021-04-03 DIAGNOSIS — Z992 Dependence on renal dialysis: Secondary | ICD-10-CM | POA: Diagnosis not present

## 2021-04-03 DIAGNOSIS — N186 End stage renal disease: Secondary | ICD-10-CM | POA: Diagnosis not present

## 2021-04-03 DIAGNOSIS — D631 Anemia in chronic kidney disease: Secondary | ICD-10-CM | POA: Diagnosis not present

## 2021-04-08 DIAGNOSIS — D509 Iron deficiency anemia, unspecified: Secondary | ICD-10-CM | POA: Diagnosis not present

## 2021-04-08 DIAGNOSIS — N186 End stage renal disease: Secondary | ICD-10-CM | POA: Diagnosis not present

## 2021-04-08 DIAGNOSIS — N2581 Secondary hyperparathyroidism of renal origin: Secondary | ICD-10-CM | POA: Diagnosis not present

## 2021-04-08 DIAGNOSIS — Z992 Dependence on renal dialysis: Secondary | ICD-10-CM | POA: Diagnosis not present

## 2021-04-11 DIAGNOSIS — Z992 Dependence on renal dialysis: Secondary | ICD-10-CM | POA: Diagnosis not present

## 2021-04-11 DIAGNOSIS — N186 End stage renal disease: Secondary | ICD-10-CM | POA: Diagnosis not present

## 2021-04-12 DIAGNOSIS — N2581 Secondary hyperparathyroidism of renal origin: Secondary | ICD-10-CM | POA: Diagnosis not present

## 2021-04-12 DIAGNOSIS — Z992 Dependence on renal dialysis: Secondary | ICD-10-CM | POA: Diagnosis not present

## 2021-04-12 DIAGNOSIS — N186 End stage renal disease: Secondary | ICD-10-CM | POA: Diagnosis not present

## 2021-04-17 DIAGNOSIS — N186 End stage renal disease: Secondary | ICD-10-CM | POA: Diagnosis not present

## 2021-04-17 DIAGNOSIS — N2581 Secondary hyperparathyroidism of renal origin: Secondary | ICD-10-CM | POA: Diagnosis not present

## 2021-04-17 DIAGNOSIS — E1122 Type 2 diabetes mellitus with diabetic chronic kidney disease: Secondary | ICD-10-CM | POA: Diagnosis not present

## 2021-04-17 DIAGNOSIS — D509 Iron deficiency anemia, unspecified: Secondary | ICD-10-CM | POA: Diagnosis not present

## 2021-04-17 DIAGNOSIS — Z992 Dependence on renal dialysis: Secondary | ICD-10-CM | POA: Diagnosis not present

## 2021-04-17 DIAGNOSIS — D631 Anemia in chronic kidney disease: Secondary | ICD-10-CM | POA: Diagnosis not present

## 2021-04-24 DIAGNOSIS — N186 End stage renal disease: Secondary | ICD-10-CM | POA: Diagnosis not present

## 2021-04-24 DIAGNOSIS — Z992 Dependence on renal dialysis: Secondary | ICD-10-CM | POA: Diagnosis not present

## 2021-04-24 DIAGNOSIS — D509 Iron deficiency anemia, unspecified: Secondary | ICD-10-CM | POA: Diagnosis not present

## 2021-04-24 DIAGNOSIS — N2581 Secondary hyperparathyroidism of renal origin: Secondary | ICD-10-CM | POA: Diagnosis not present

## 2021-04-30 DIAGNOSIS — R29898 Other symptoms and signs involving the musculoskeletal system: Secondary | ICD-10-CM | POA: Diagnosis not present

## 2021-04-30 DIAGNOSIS — M25511 Pain in right shoulder: Secondary | ICD-10-CM | POA: Diagnosis not present

## 2021-04-30 DIAGNOSIS — S46011D Strain of muscle(s) and tendon(s) of the rotator cuff of right shoulder, subsequent encounter: Secondary | ICD-10-CM | POA: Diagnosis not present

## 2021-05-01 DIAGNOSIS — D509 Iron deficiency anemia, unspecified: Secondary | ICD-10-CM | POA: Diagnosis not present

## 2021-05-01 DIAGNOSIS — N2581 Secondary hyperparathyroidism of renal origin: Secondary | ICD-10-CM | POA: Diagnosis not present

## 2021-05-01 DIAGNOSIS — Z992 Dependence on renal dialysis: Secondary | ICD-10-CM | POA: Diagnosis not present

## 2021-05-01 DIAGNOSIS — N186 End stage renal disease: Secondary | ICD-10-CM | POA: Diagnosis not present

## 2021-05-08 DIAGNOSIS — Z992 Dependence on renal dialysis: Secondary | ICD-10-CM | POA: Diagnosis not present

## 2021-05-08 DIAGNOSIS — N2581 Secondary hyperparathyroidism of renal origin: Secondary | ICD-10-CM | POA: Diagnosis not present

## 2021-05-08 DIAGNOSIS — N186 End stage renal disease: Secondary | ICD-10-CM | POA: Diagnosis not present

## 2021-05-09 ENCOUNTER — Other Ambulatory Visit: Payer: Self-pay

## 2021-05-09 ENCOUNTER — Ambulatory Visit (INDEPENDENT_AMBULATORY_CARE_PROVIDER_SITE_OTHER): Payer: PPO

## 2021-05-09 DIAGNOSIS — I700 Atherosclerosis of aorta: Secondary | ICD-10-CM

## 2021-05-09 DIAGNOSIS — I2510 Atherosclerotic heart disease of native coronary artery without angina pectoris: Secondary | ICD-10-CM

## 2021-05-09 DIAGNOSIS — I359 Nonrheumatic aortic valve disorder, unspecified: Secondary | ICD-10-CM

## 2021-05-09 LAB — ECHOCARDIOGRAM COMPLETE
AR max vel: 0.87 cm2
AV Area VTI: 0.92 cm2
AV Area mean vel: 0.81 cm2
AV Mean grad: 28.5 mmHg
AV Peak grad: 53.2 mmHg
Ao pk vel: 3.65 m/s
Area-P 1/2: 1.8 cm2
Calc EF: 53.5 %
P 1/2 time: 432 msec
S' Lateral: 3.4 cm
Single Plane A2C EF: 52.8 %
Single Plane A4C EF: 59 %

## 2021-05-11 DIAGNOSIS — S46011D Strain of muscle(s) and tendon(s) of the rotator cuff of right shoulder, subsequent encounter: Secondary | ICD-10-CM | POA: Diagnosis not present

## 2021-05-12 DIAGNOSIS — Z992 Dependence on renal dialysis: Secondary | ICD-10-CM | POA: Diagnosis not present

## 2021-05-12 DIAGNOSIS — N186 End stage renal disease: Secondary | ICD-10-CM | POA: Diagnosis not present

## 2021-05-15 DIAGNOSIS — N186 End stage renal disease: Secondary | ICD-10-CM | POA: Diagnosis not present

## 2021-05-15 DIAGNOSIS — N2581 Secondary hyperparathyroidism of renal origin: Secondary | ICD-10-CM | POA: Diagnosis not present

## 2021-05-15 DIAGNOSIS — Z992 Dependence on renal dialysis: Secondary | ICD-10-CM | POA: Diagnosis not present

## 2021-05-18 DIAGNOSIS — S46011D Strain of muscle(s) and tendon(s) of the rotator cuff of right shoulder, subsequent encounter: Secondary | ICD-10-CM | POA: Diagnosis not present

## 2021-05-21 ENCOUNTER — Telehealth: Payer: Self-pay

## 2021-05-21 NOTE — Telephone Encounter (Signed)
Able to reach pt regarding his recent ECHO Dr. Rockey Situ had a chance to review his results and advised   "Echocardiogram  Normal LV function  Aortic valve stenosis slightly worse, moderate to severe, slight increase in gradient 28 up to 32 mmHg  Repeat echo 1 year to document aortic valve stenosis "  Dylan Sanders very thankful for the phone call of his results, all questions and concerns were address with nothing further at this time. Will see at next schedule f/u appt.

## 2021-05-22 DIAGNOSIS — N186 End stage renal disease: Secondary | ICD-10-CM | POA: Diagnosis not present

## 2021-05-22 DIAGNOSIS — N2581 Secondary hyperparathyroidism of renal origin: Secondary | ICD-10-CM | POA: Diagnosis not present

## 2021-05-22 DIAGNOSIS — Z992 Dependence on renal dialysis: Secondary | ICD-10-CM | POA: Diagnosis not present

## 2021-05-25 DIAGNOSIS — M7581 Other shoulder lesions, right shoulder: Secondary | ICD-10-CM | POA: Insufficient documentation

## 2021-05-25 DIAGNOSIS — M25511 Pain in right shoulder: Secondary | ICD-10-CM | POA: Diagnosis not present

## 2021-05-25 DIAGNOSIS — M75121 Complete rotator cuff tear or rupture of right shoulder, not specified as traumatic: Secondary | ICD-10-CM | POA: Diagnosis not present

## 2021-05-29 DIAGNOSIS — Z992 Dependence on renal dialysis: Secondary | ICD-10-CM | POA: Diagnosis not present

## 2021-05-29 DIAGNOSIS — N186 End stage renal disease: Secondary | ICD-10-CM | POA: Diagnosis not present

## 2021-05-29 DIAGNOSIS — N2581 Secondary hyperparathyroidism of renal origin: Secondary | ICD-10-CM | POA: Diagnosis not present

## 2021-05-29 DIAGNOSIS — D631 Anemia in chronic kidney disease: Secondary | ICD-10-CM | POA: Diagnosis not present

## 2021-05-30 DIAGNOSIS — E119 Type 2 diabetes mellitus without complications: Secondary | ICD-10-CM | POA: Diagnosis not present

## 2021-06-05 ENCOUNTER — Other Ambulatory Visit (INDEPENDENT_AMBULATORY_CARE_PROVIDER_SITE_OTHER): Payer: Self-pay | Admitting: Nurse Practitioner

## 2021-06-05 DIAGNOSIS — Z992 Dependence on renal dialysis: Secondary | ICD-10-CM | POA: Diagnosis not present

## 2021-06-05 DIAGNOSIS — N186 End stage renal disease: Secondary | ICD-10-CM

## 2021-06-05 DIAGNOSIS — N2581 Secondary hyperparathyroidism of renal origin: Secondary | ICD-10-CM | POA: Diagnosis not present

## 2021-06-05 DIAGNOSIS — R5383 Other fatigue: Secondary | ICD-10-CM | POA: Diagnosis not present

## 2021-06-06 ENCOUNTER — Other Ambulatory Visit: Payer: Self-pay

## 2021-06-06 ENCOUNTER — Encounter (INDEPENDENT_AMBULATORY_CARE_PROVIDER_SITE_OTHER): Payer: Self-pay | Admitting: Nurse Practitioner

## 2021-06-06 ENCOUNTER — Ambulatory Visit (INDEPENDENT_AMBULATORY_CARE_PROVIDER_SITE_OTHER): Payer: PPO

## 2021-06-06 ENCOUNTER — Ambulatory Visit (INDEPENDENT_AMBULATORY_CARE_PROVIDER_SITE_OTHER): Payer: PPO | Admitting: Nurse Practitioner

## 2021-06-06 VITALS — BP 118/51 | HR 66 | Ht 68.0 in | Wt 136.0 lb

## 2021-06-06 DIAGNOSIS — Z992 Dependence on renal dialysis: Secondary | ICD-10-CM | POA: Diagnosis not present

## 2021-06-06 DIAGNOSIS — N186 End stage renal disease: Secondary | ICD-10-CM

## 2021-06-06 DIAGNOSIS — I10 Essential (primary) hypertension: Secondary | ICD-10-CM | POA: Diagnosis not present

## 2021-06-06 DIAGNOSIS — E7800 Pure hypercholesterolemia, unspecified: Secondary | ICD-10-CM

## 2021-06-07 ENCOUNTER — Encounter (INDEPENDENT_AMBULATORY_CARE_PROVIDER_SITE_OTHER): Payer: Self-pay | Admitting: Nurse Practitioner

## 2021-06-07 NOTE — H&P (View-Only) (Signed)
Subjective:    Patient ID: Dylan Sanders, male    DOB: December 05, 1931, 88 y.o.   MRN: [id number] Chief Complaint  Patient presents with   Follow-up    Consult with U/S    Dylan Sanders is an 88 year old male returns to the office for follow up regarding problem with the dialysis access. Currently the patient is maintained via a left brachial ax graft.  The patient notes that his dialysis access has been clotting for the last several sessions.  He has not been getting the same quality of dialysis due to the clotting.  The patient notes a significant increase in bleeding time after decannulation.  The patient has also been informed that there is increased recirculation.    The patient denies hand pain or other symptoms consistent with steal phenomena.  No significant arm swelling.  The patient denies redness or swelling at the access site. The patient denies fever or chills at home or while on dialysis.  The patient denies amaurosis fugax or recent TIA symptoms. There are no recent neurological changes noted. The patient denies claudication symptoms or rest pain symptoms. The patient denies history of DVT, PE or superficial thrombophlebitis. The patient denies recent episodes of angina or shortness of breath.    Today the patient has a flow volume of 1443.  There is a significant stenosis near the arterial anastomosis/proximal graft work shows a narrowing to 0.13 cm and stenotic.   Review of Systems  Hematological:  Bruises/bleeds easily.  All other systems reviewed and are negative.     Objective:   Physical Exam Vitals reviewed.  HENT:     Head: Normocephalic.  Cardiovascular:     Rate and Rhythm: Normal rate.     Pulses:          Radial pulses are 2+ on the left side.     Arteriovenous access: Left arteriovenous access is present.    Comments: Good thrill and bruit Pulmonary:     Effort: Pulmonary effort is normal.  Skin:    General: Skin is warm and dry.  Neurological:      Mental Status: He is alert and oriented to person, place, and time.  Psychiatric:        Mood and Affect: Mood normal.        Behavior: Behavior normal.        Thought Content: Thought content normal.        Judgment: Judgment normal.    BP (!) 118/51    Pulse 66    Ht 5\' 8"  (1.727 m)    Wt 136 lb (61.7 kg)    BMI 20.68 kg/m   Past Medical History:  Diagnosis Date   Anemia    Cancer ([place]) 2008   colon   Chronic kidney disease    Diabetes mellitus without complication ([facility])    Heart murmur    asymptomatic   Hypertension     Social History   Socioeconomic History   Marital status: Widowed    Spouse name: Not on file   Number of children: Not on file   Years of education: Not on file   Highest education level: Not on file  Occupational History   Not on file  Tobacco Use   Smoking status: Former    Years: 25.00    Types: Pipe, Cigarettes, Cigars    Quit date: 09/05/1976    Years since quitting: 44.7   Smokeless tobacco: Never  Vaping Use  Vaping Use: Never used  Substance and Sexual Activity   Alcohol use: Yes    Comment: ocassionally   Drug use: No   Sexual activity: Never  Other Topics Concern   Not on file  Social History Narrative   Not on file   Social Determinants of Health   Financial Resource Strain: Not on file  Food Insecurity: Not on file  Transportation Needs: Not on file  Physical Activity: Not on file  Stress: Not on file  Social Connections: Not on file  Intimate Partner Violence: Not on file    Past Surgical History:  Procedure Laterality Date   A/V SHUNT INTERVENTION Left 07/27/2019   Procedure: A/V SHUNT INTERVENTION;  Surgeon: Katha Cabal, MD;  Location: [place] [facility];  Service: Cardiovascular;  Laterality: Left;   AV FISTULA PLACEMENT Left 01/08/2019   Procedure: INSERTION OF ARTERIOVENOUS (AV) GORE-TEX GRAFT ARM ( BRACHIAL AXILLARY );  Surgeon: Katha Cabal, MD;  Location: [facility];  Service: Vascular;   Laterality: Left;   COLON SURGERY     EYE SURGERY     bilateral   RENAL ANGIOGRAPHY Bilateral 11/19/2016   Procedure: Renal Angiography;  Surgeon: Katha Cabal, MD;  Location: [place] [facility];  Service: Cardiovascular;  Laterality: Bilateral;   RENAL ANGIOGRAPHY Right 07/03/2018   Procedure: RENAL ANGIOGRAPHY;  Surgeon: Katha Cabal, MD;  Location: [place] [facility];  Service: Cardiovascular;  Laterality: Right;   REVERSE SHOULDER ARTHROPLASTY Left 09/19/2016   Procedure: REVERSE SHOULDER ARTHROPLASTY;  Surgeon: Corky Mull, MD;  Location: [facility];  Service: Orthopedics;  Laterality: Left;   ROTATOR CUFF REPAIR Right     Family History  Problem Relation Age of Onset   Cancer Father     Allergies  Allergen Reactions   Ace Inhibitors Other (See Comments)    CBC Latest Ref Rng & Units 03/02/2021 02/25/2020 08/25/2019  WBC 4.0 - 10.5 K/uL 6.3 4.9 5.0  Hemoglobin 13.0 - 17.0 g/dL 12.5(L) 11.9(L) 9.6(L)  Hematocrit 39.0 - 52.0 % 37.0(L) 36.1(L) 29.7(L)  Platelets 150 - 400 K/uL 137(L) 169 156      CMP     Component Value Date/Time   NA 137 02/25/2020 1044   NA 141 06/19/2011 1343   K 4.0 02/25/2020 1044   K 4.1 06/19/2011 1343   CL 92 (L) 02/25/2020 1044   CL 102 06/19/2011 1343   CO2 30 02/25/2020 1044   CO2 32 06/19/2011 1343   GLUCOSE 189 (H) 02/25/2020 1044   GLUCOSE 193 (H) 06/19/2011 1343   BUN 41 (H) 02/25/2020 1044   BUN 26 (H) 06/19/2011 1343   CREATININE 5.36 (H) 02/25/2020 1044   CREATININE 2.43 (H) 06/19/2011 1343   CALCIUM 8.7 (L) 02/25/2020 1044   CALCIUM 8.9 06/19/2011 1343   PROT 7.3 02/25/2020 1044   PROT 7.3 06/19/2011 1343   ALBUMIN 4.1 02/25/2020 1044   ALBUMIN 3.8 06/19/2011 1343   AST 21 02/25/2020 1044   AST 24 06/19/2011 1343   ALT 15 02/25/2020 1044   ALT 35 06/19/2011 1343   ALKPHOS 57 02/25/2020 1044   ALKPHOS 89 06/19/2011 1343   BILITOT 0.9 02/25/2020 1044   BILITOT 0.6 06/19/2011 1343   GFRNONAA 9 (L) 02/25/2020  1044   GFRNONAA 28 (L) 06/19/2011 1343   GFRAA 21 (L) 07/27/2019 1531   GFRAA 33 (L) 06/19/2011 1343     No results found.     Assessment & Plan:   1. ESRD on  dialysis [facility]) Recommend:  The patient is experiencing increasing problems with their dialysis access.  Patient should have a shuntogram with the intention for intervention.  The intention for intervention is to restore appropriate flow and prevent thrombosis and possible loss of the access.  As well as improve the quality of dialysis therapy.  The risks, benefits and alternative therapies were reviewed in detail with the patient.  All questions were answered.  The patient agrees to proceed with angio/intervention.      2. Hypertension, essential Continue antihypertensive medications as already ordered, these medications have been reviewed and there are no changes at this time.   3. Pure hypercholesterolemia Continue statin as ordered and reviewed, no changes at this time    Current Outpatient Medications on File Prior to Visit  Medication Sig Dispense Refill   allopurinol (ZYLOPRIM) 100 MG tablet Take 100 mg by mouth at bedtime.      amLODipine (NORVASC) 10 MG tablet Take 1 tablet (10 mg total) by mouth every morning. 90 tablet 3   aspirin EC 81 MG tablet Take 81 mg by mouth at bedtime.      azithromycin (ZITHROMAX) 250 MG tablet Take by mouth.     B Complex-C-Folic Acid (RENA-VITE RX) 1 MG TABS Take 1 tablet by mouth daily.     carvedilol (COREG) 3.125 MG tablet Take 1 tablet (3.125 mg total) by mouth 2 (two) times daily with a meal. 180 tablet 3   cloNIDine (CATAPRES) 0.1 MG tablet Take 1 tablet (0.1 mg total) by mouth 2 (two) times daily. 180 tablet 3   clopidogrel (PLAVIX) 75 MG tablet Take 1 tablet (75 mg total) by mouth daily. 90 tablet 3   ferrous sulfate 325 (65 FE) MG EC tablet Take 1 tablet (325 mg total) by mouth 2 (two) times daily. 90 tablet 1   LANTUS SOLOSTAR 100 UNIT/ML Solostar Pen Inject 10 Units into  the skin every morning.      lidocaine-prilocaine (EMLA) cream SMARTSIG:Sparingly Topical 3 Times a Week     losartan (COZAAR) 100 MG tablet Take 1 tablet (100 mg total) by mouth daily. 90 tablet 3   Methoxy PEG-Epoetin Beta (MIRCERA IJ) Mircera     ONETOUCH ULTRA test strip      predniSONE (DELTASONE) 10 MG tablet Take by mouth.     psyllium (METAMUCIL) 58.6 % powder Take 1 packet by mouth daily.     sevelamer (RENAGEL) 800 MG tablet Take by mouth.     sevelamer carbonate (RENVELA) 800 MG tablet Take 800 mg by mouth 3 (three) times daily.     simvastatin (ZOCOR) 20 MG tablet Take 1 tablet (20 mg total) by mouth at bedtime. 90 tablet 3   telmisartan (MICARDIS) 80 MG tablet Take 1 tablet (80 mg total) by mouth every morning. 90 tablet 3   vitamin B-12 (CYANOCOBALAMIN) 1000 MCG tablet Take 1 tablet (1,000 mcg total) by mouth daily. 90 tablet 0   vitamin C (ASCORBIC ACID) 500 MG tablet Take 1 tablet (500 mg total) by mouth daily. 90 tablet 0   traZODone (DESYREL) 50 MG tablet Take by mouth.     No current facility-administered medications on file prior to visit.    There are no Patient Instructions on file for this visit. No follow-ups on file.   Kris Hartmann, NP

## 2021-06-07 NOTE — Progress Notes (Signed)
Subjective:    Patient ID: Dylan Sanders, male    DOB: October 25, 1931, 88 y.o.   MRN: [id number] Chief Complaint  Patient presents with   Follow-up    Consult with U/S    Dylan Sanders is an 88 year old male returns to the office for follow up regarding problem with the dialysis access. Currently the patient is maintained via a left brachial ax graft.  The patient notes that his dialysis access has been clotting for the last several sessions.  He has not been getting the same quality of dialysis due to the clotting.  The patient notes a significant increase in bleeding time after decannulation.  The patient has also been informed that there is increased recirculation.    The patient denies hand pain or other symptoms consistent with steal phenomena.  No significant arm swelling.  The patient denies redness or swelling at the access site. The patient denies fever or chills at home or while on dialysis.  The patient denies amaurosis fugax or recent TIA symptoms. There are no recent neurological changes noted. The patient denies claudication symptoms or rest pain symptoms. The patient denies history of DVT, PE or superficial thrombophlebitis. The patient denies recent episodes of angina or shortness of breath.    Today the patient has a flow volume of 1443.  There is a significant stenosis near the arterial anastomosis/proximal graft work shows a narrowing to 0.13 cm and stenotic.   Review of Systems  Hematological:  Bruises/bleeds easily.  All other systems reviewed and are negative.     Objective:   Physical Exam Vitals reviewed.  HENT:     Head: Normocephalic.  Cardiovascular:     Rate and Rhythm: Normal rate.     Pulses:          Radial pulses are 2+ on the left side.     Arteriovenous access: Left arteriovenous access is present.    Comments: Good thrill and bruit Pulmonary:     Effort: Pulmonary effort is normal.  Skin:    General: Skin is warm and dry.  Neurological:      Mental Status: He is alert and oriented to person, place, and time.  Psychiatric:        Mood and Affect: Mood normal.        Behavior: Behavior normal.        Thought Content: Thought content normal.        Judgment: Judgment normal.    BP (!) 118/51    Pulse 66    Ht 5\' 8"  (1.727 m)    Wt 136 lb (61.7 kg)    BMI 20.68 kg/m   Past Medical History:  Diagnosis Date   Anemia    Cancer ([place]) 2008   colon   Chronic kidney disease    Diabetes mellitus without complication (HCC)    Heart murmur    asymptomatic   Hypertension     Social History   Socioeconomic History   Marital status: Widowed    Spouse name: Not on file   Number of children: Not on file   Years of education: Not on file   Highest education level: Not on file  Occupational History   Not on file  Tobacco Use   Smoking status: Former    Years: 25.00    Types: Pipe, Cigarettes, Cigars    Quit date: 09/05/1976    Years since quitting: 44.7   Smokeless tobacco: Never  Vaping Use  Vaping Use: Never used  Substance and Sexual Activity   Alcohol use: Yes    Comment: ocassionally   Drug use: No   Sexual activity: Never  Other Topics Concern   Not on file  Social History Narrative   Not on file   Social Determinants of Health   Financial Resource Strain: Not on file  Food Insecurity: Not on file  Transportation Needs: Not on file  Physical Activity: Not on file  Stress: Not on file  Social Connections: Not on file  Intimate Partner Violence: Not on file    Past Surgical History:  Procedure Laterality Date   A/V SHUNT INTERVENTION Left 07/27/2019   Procedure: A/V SHUNT INTERVENTION;  Surgeon: Katha Cabal, MD;  Location: [facility];  Service: Cardiovascular;  Laterality: Left;   AV FISTULA PLACEMENT Left 01/08/2019   Procedure: INSERTION OF ARTERIOVENOUS (AV) GORE-TEX GRAFT ARM ( BRACHIAL AXILLARY );  Surgeon: Katha Cabal, MD;  Location: [facility];  Service: Vascular;   Laterality: Left;   COLON SURGERY     EYE SURGERY     bilateral   RENAL ANGIOGRAPHY Bilateral 11/19/2016   Procedure: Renal Angiography;  Surgeon: Katha Cabal, MD;  Location: [facility];  Service: Cardiovascular;  Laterality: Bilateral;   RENAL ANGIOGRAPHY Right 07/03/2018   Procedure: RENAL ANGIOGRAPHY;  Surgeon: Katha Cabal, MD;  Location: [facility];  Service: Cardiovascular;  Laterality: Right;   REVERSE SHOULDER ARTHROPLASTY Left 09/19/2016   Procedure: REVERSE SHOULDER ARTHROPLASTY;  Surgeon: Corky Mull, MD;  Location: [facility];  Service: Orthopedics;  Laterality: Left;   ROTATOR CUFF REPAIR Right     Family History  Problem Relation Age of Onset   Cancer Father     Allergies  Allergen Reactions   Ace Inhibitors Other (See Comments)    CBC Latest Ref Rng & Units 03/02/2021 02/25/2020 08/25/2019  WBC 4.0 - 10.5 K/uL 6.3 4.9 5.0  Hemoglobin 13.0 - 17.0 g/dL 12.5(L) 11.9(L) 9.6(L)  Hematocrit 39.0 - 52.0 % 37.0(L) 36.1(L) 29.7(L)  Platelets 150 - 400 K/uL 137(L) 169 156      CMP     Component Value Date/Time   NA 137 02/25/2020 1044   NA 141 06/19/2011 1343   K 4.0 02/25/2020 1044   K 4.1 06/19/2011 1343   CL 92 (L) 02/25/2020 1044   CL 102 06/19/2011 1343   CO2 30 02/25/2020 1044   CO2 32 06/19/2011 1343   GLUCOSE 189 (H) 02/25/2020 1044   GLUCOSE 193 (H) 06/19/2011 1343   BUN 41 (H) 02/25/2020 1044   BUN 26 (H) 06/19/2011 1343   CREATININE 5.36 (H) 02/25/2020 1044   CREATININE 2.43 (H) 06/19/2011 1343   CALCIUM 8.7 (L) 02/25/2020 1044   CALCIUM 8.9 06/19/2011 1343   PROT 7.3 02/25/2020 1044   PROT 7.3 06/19/2011 1343   ALBUMIN 4.1 02/25/2020 1044   ALBUMIN 3.8 06/19/2011 1343   AST 21 02/25/2020 1044   AST 24 06/19/2011 1343   ALT 15 02/25/2020 1044   ALT 35 06/19/2011 1343   ALKPHOS 57 02/25/2020 1044   ALKPHOS 89 06/19/2011 1343   BILITOT 0.9 02/25/2020 1044   BILITOT 0.6 06/19/2011 1343   GFRNONAA 9 (L) 02/25/2020  1044   GFRNONAA 28 (L) 06/19/2011 1343   GFRAA 21 (L) 07/27/2019 1531   GFRAA 33 (L) 06/19/2011 1343     No results found.     Assessment & Plan:   1. ESRD on  dialysis [facility]) Recommend:  The patient is experiencing increasing problems with their dialysis access.  Patient should have a shuntogram with the intention for intervention.  The intention for intervention is to restore appropriate flow and prevent thrombosis and possible loss of the access.  As well as improve the quality of dialysis therapy.  The risks, benefits and alternative therapies were reviewed in detail with the patient.  All questions were answered.  The patient agrees to proceed with angio/intervention.      2. Hypertension, essential Continue antihypertensive medications as already ordered, these medications have been reviewed and there are no changes at this time.   3. Pure hypercholesterolemia Continue statin as ordered and reviewed, no changes at this time    Current Outpatient Medications on File Prior to Visit  Medication Sig Dispense Refill   allopurinol (ZYLOPRIM) 100 MG tablet Take 100 mg by mouth at bedtime.      amLODipine (NORVASC) 10 MG tablet Take 1 tablet (10 mg total) by mouth every morning. 90 tablet 3   aspirin EC 81 MG tablet Take 81 mg by mouth at bedtime.      azithromycin (ZITHROMAX) 250 MG tablet Take by mouth.     B Complex-C-Folic Acid (RENA-VITE RX) 1 MG TABS Take 1 tablet by mouth daily.     carvedilol (COREG) 3.125 MG tablet Take 1 tablet (3.125 mg total) by mouth 2 (two) times daily with a meal. 180 tablet 3   cloNIDine (CATAPRES) 0.1 MG tablet Take 1 tablet (0.1 mg total) by mouth 2 (two) times daily. 180 tablet 3   clopidogrel (PLAVIX) 75 MG tablet Take 1 tablet (75 mg total) by mouth daily. 90 tablet 3   ferrous sulfate 325 (65 FE) MG EC tablet Take 1 tablet (325 mg total) by mouth 2 (two) times daily. 90 tablet 1   LANTUS SOLOSTAR 100 UNIT/ML Solostar Pen Inject 10 Units into  the skin every morning.      lidocaine-prilocaine (EMLA) cream SMARTSIG:Sparingly Topical 3 Times a Week     losartan (COZAAR) 100 MG tablet Take 1 tablet (100 mg total) by mouth daily. 90 tablet 3   Methoxy PEG-Epoetin Beta (MIRCERA IJ) Mircera     ONETOUCH ULTRA test strip      predniSONE (DELTASONE) 10 MG tablet Take by mouth.     psyllium (METAMUCIL) 58.6 % powder Take 1 packet by mouth daily.     sevelamer (RENAGEL) 800 MG tablet Take by mouth.     sevelamer carbonate (RENVELA) 800 MG tablet Take 800 mg by mouth 3 (three) times daily.     simvastatin (ZOCOR) 20 MG tablet Take 1 tablet (20 mg total) by mouth at bedtime. 90 tablet 3   telmisartan (MICARDIS) 80 MG tablet Take 1 tablet (80 mg total) by mouth every morning. 90 tablet 3   vitamin B-12 (CYANOCOBALAMIN) 1000 MCG tablet Take 1 tablet (1,000 mcg total) by mouth daily. 90 tablet 0   vitamin C (ASCORBIC ACID) 500 MG tablet Take 1 tablet (500 mg total) by mouth daily. 90 tablet 0   traZODone (DESYREL) 50 MG tablet Take by mouth.     No current facility-administered medications on file prior to visit.    There are no Patient Instructions on file for this visit. No follow-ups on file.   Kris Hartmann, NP

## 2021-06-12 ENCOUNTER — Telehealth (INDEPENDENT_AMBULATORY_CARE_PROVIDER_SITE_OTHER): Payer: Self-pay

## 2021-06-12 DIAGNOSIS — N2581 Secondary hyperparathyroidism of renal origin: Secondary | ICD-10-CM | POA: Diagnosis not present

## 2021-06-12 DIAGNOSIS — N186 End stage renal disease: Secondary | ICD-10-CM | POA: Diagnosis not present

## 2021-06-12 DIAGNOSIS — Z992 Dependence on renal dialysis: Secondary | ICD-10-CM | POA: Diagnosis not present

## 2021-06-12 NOTE — Telephone Encounter (Addendum)
I called the patient to schedule him for his left arm shuntogram with Dr. Lucky Cowboy. The patient was at dialysis and stated he would call me back to schedule his procedure. Patient called back and is scheduled on 06/20/21 with a 9:30 am arrival time to the MM. Pre-procedure instructions were discussed and will be mailed.

## 2021-06-14 DIAGNOSIS — Z992 Dependence on renal dialysis: Secondary | ICD-10-CM | POA: Diagnosis not present

## 2021-06-14 DIAGNOSIS — N2581 Secondary hyperparathyroidism of renal origin: Secondary | ICD-10-CM | POA: Diagnosis not present

## 2021-06-14 DIAGNOSIS — N186 End stage renal disease: Secondary | ICD-10-CM | POA: Diagnosis not present

## 2021-06-16 IMAGING — CR DG BONE SURVEY MET
1 series · 10 of 10 positions shown · non-contrast
Comparison: None.

CLINICAL DATA: MGUS

EXAM:
METASTATIC BONE SURVEY

[Series 1: dg bone survey met · 0.14mm/px · 10 of 24 slices shown]
[im 1/24]
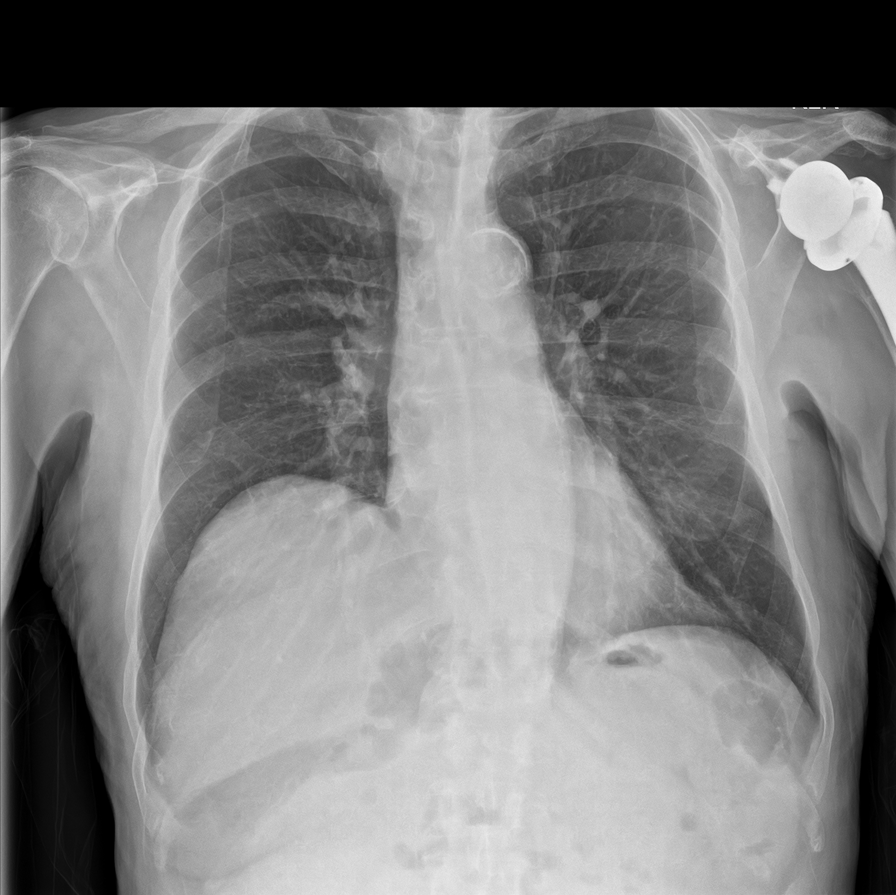
[im 3/24]
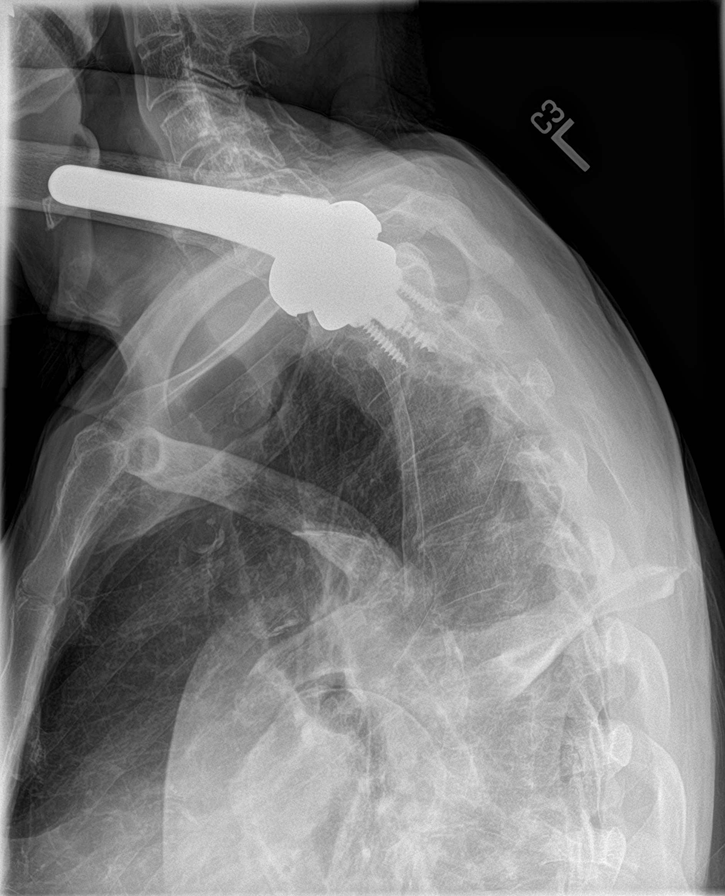
[im 6/24]
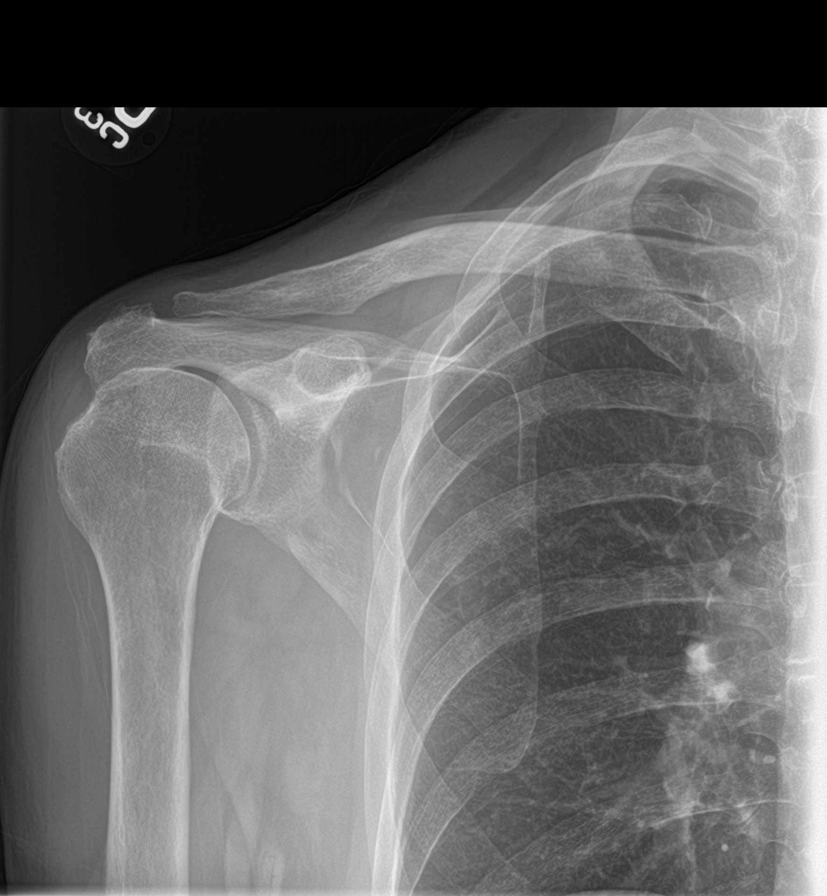
[im 8/24]
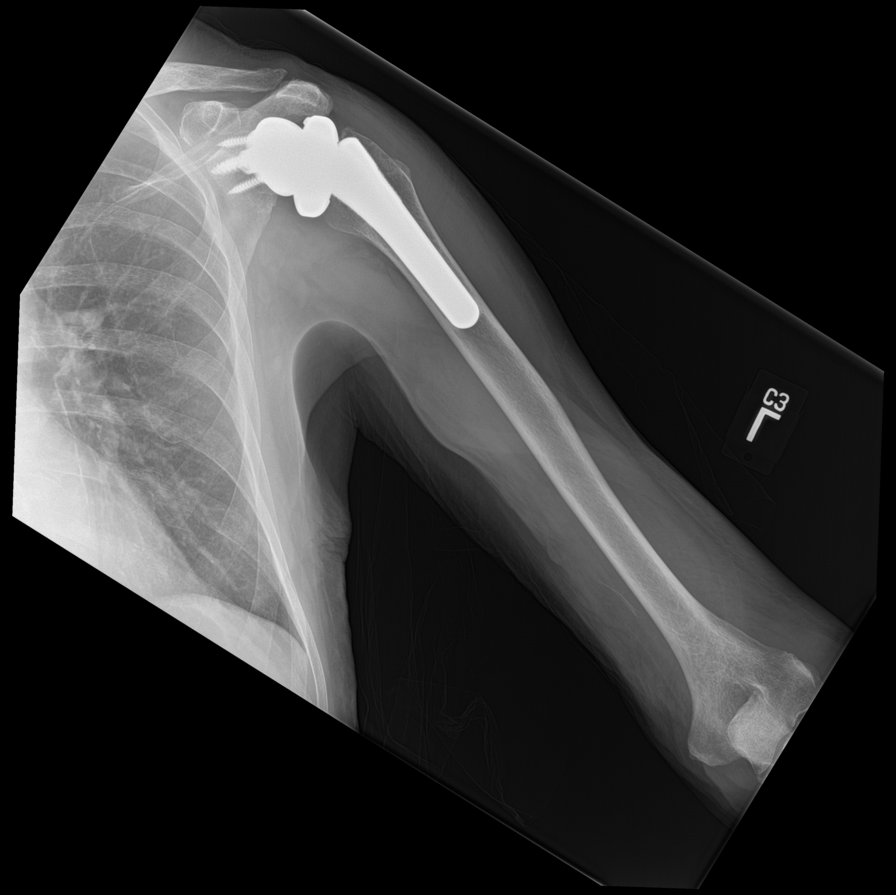
[im 11/24]
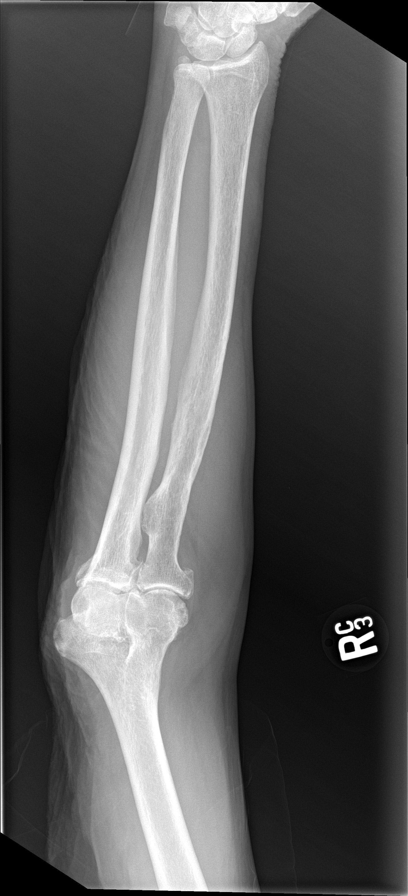
[im 13/24]
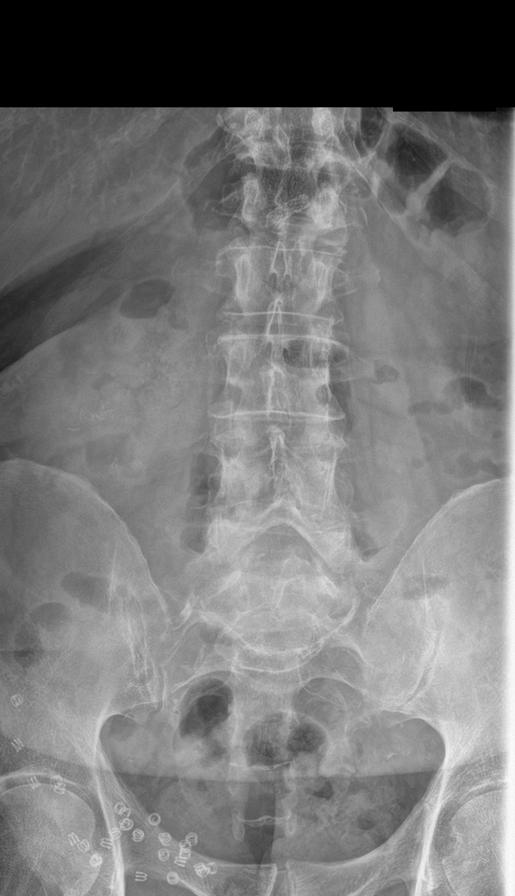
[im 16/24]
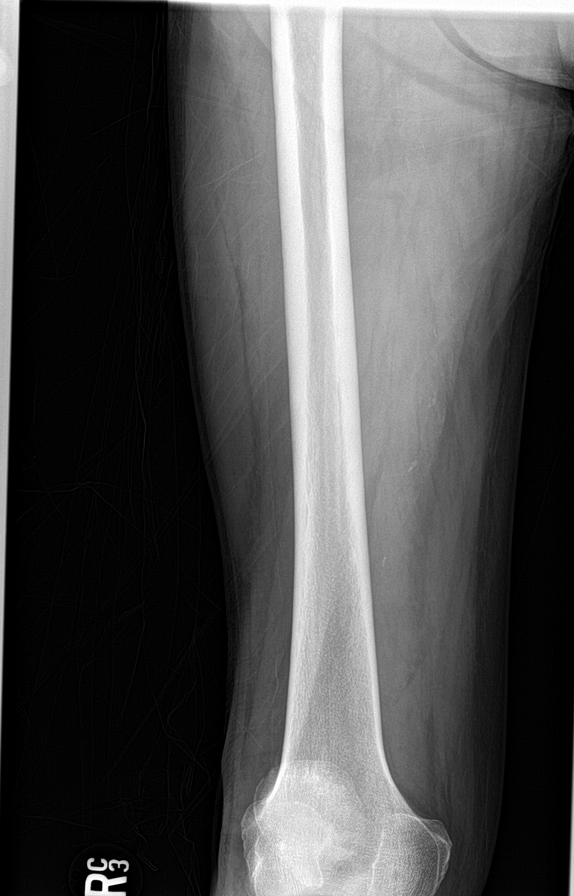
[im 18/24]
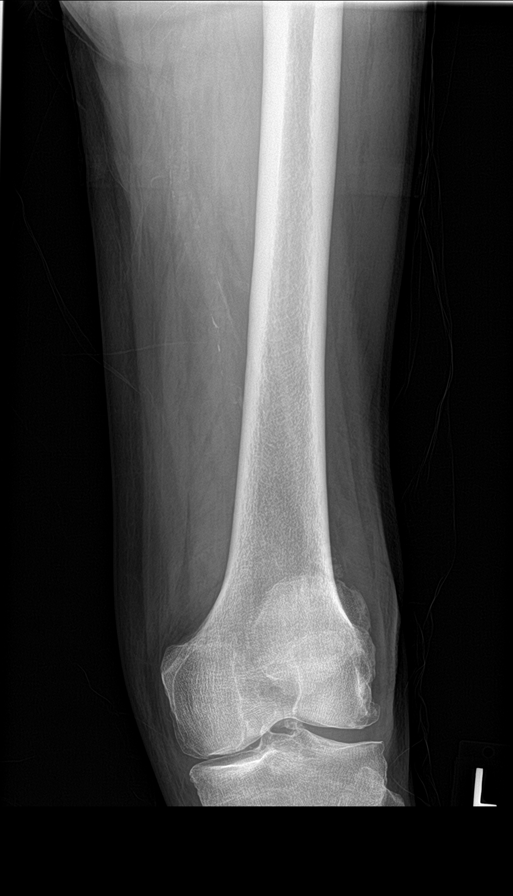
[im 21/24]
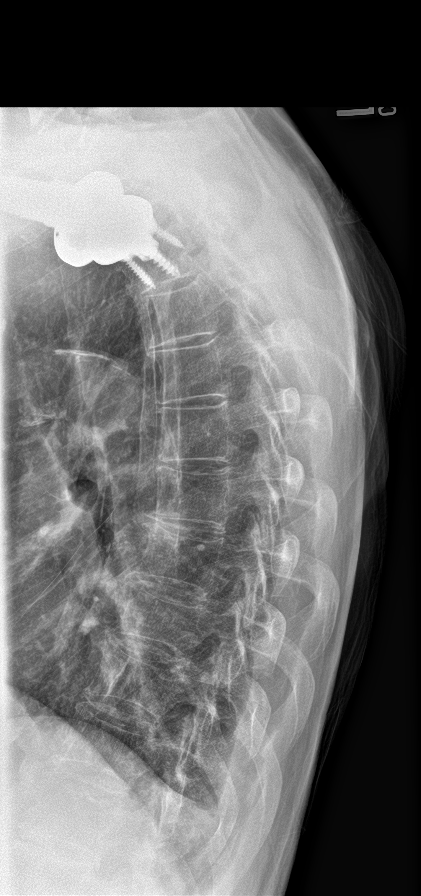
[im 24/24]
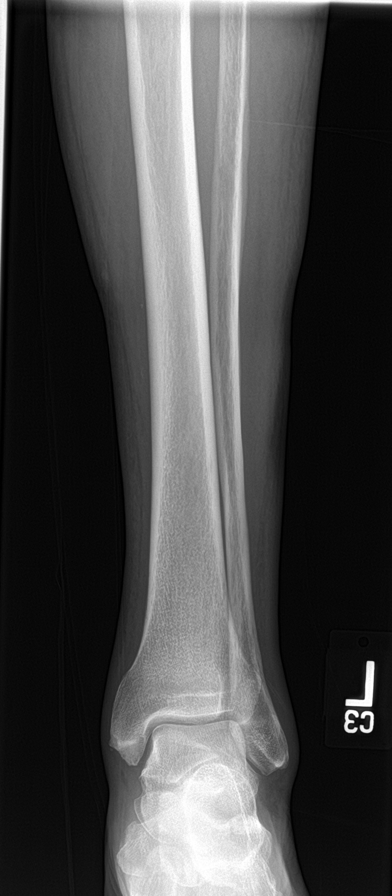

[10 of 10 positions shown; findings below may reference images not displayed]

FINDINGS: Mild eventration of the right hemidiaphragm. Lungs are clear. No
pleural effusion or pneumothorax.

The heart is normal in size.  Thoracic aortic atherosclerosis.

Visualized bowel is unremarkable. Right inguinal hernia mesh repair.
Vascular stent overlying the mid abdomen. Vascular calcifications.

No lytic lesions in the calvarium.

Mild degenerative changes of the mid cervical spine.

Left shoulder arthroplasty. No lytic lesions in the bilateral upper
extremities.

Mild degenerative changes of the visualized thoracolumbar spine.
Visualized bony pelvis appears intact.

No lytic lesions in the visualized bilateral lower extremities.
IMPRESSION: No lytic lesions in the visualized axial or appendicular skeleton.

Additional ancillary findings as above.

## 2021-06-19 ENCOUNTER — Other Ambulatory Visit (INDEPENDENT_AMBULATORY_CARE_PROVIDER_SITE_OTHER): Payer: Self-pay | Admitting: Nurse Practitioner

## 2021-06-19 DIAGNOSIS — N186 End stage renal disease: Secondary | ICD-10-CM | POA: Diagnosis not present

## 2021-06-19 DIAGNOSIS — Z992 Dependence on renal dialysis: Secondary | ICD-10-CM | POA: Diagnosis not present

## 2021-06-19 DIAGNOSIS — N2581 Secondary hyperparathyroidism of renal origin: Secondary | ICD-10-CM | POA: Diagnosis not present

## 2021-06-20 ENCOUNTER — Encounter
Admission: RE | Disposition: A | Discharge status: Home / Self Care | Payer: Self-pay | Source: Ambulatory Visit | Attending: Vascular Surgery

## 2021-06-20 ENCOUNTER — Encounter: Payer: Self-pay | Admitting: Vascular Surgery

## 2021-06-20 ENCOUNTER — Ambulatory Visit
Admission: RE | Admit: 2021-06-20 | Discharge: 2021-06-20 | Disposition: A | Discharge status: Home / Self Care | Payer: PPO | Source: Ambulatory Visit | Attending: Vascular Surgery | Admitting: Vascular Surgery

## 2021-06-20 DIAGNOSIS — Z794 Long term (current) use of insulin: Secondary | ICD-10-CM | POA: Insufficient documentation

## 2021-06-20 DIAGNOSIS — Y841 Kidney dialysis as the cause of abnormal reaction of the patient, or of later complication, without mention of misadventure at the time of the procedure: Secondary | ICD-10-CM | POA: Diagnosis not present

## 2021-06-20 DIAGNOSIS — T82858A Stenosis of vascular prosthetic devices, implants and grafts, initial encounter: Secondary | ICD-10-CM | POA: Insufficient documentation

## 2021-06-20 DIAGNOSIS — E7800 Pure hypercholesterolemia, unspecified: Secondary | ICD-10-CM | POA: Diagnosis not present

## 2021-06-20 DIAGNOSIS — T82898A Other specified complication of vascular prosthetic devices, implants and grafts, initial encounter: Secondary | ICD-10-CM | POA: Diagnosis not present

## 2021-06-20 DIAGNOSIS — I120 Hypertensive chronic kidney disease with stage 5 chronic kidney disease or end stage renal disease: Secondary | ICD-10-CM | POA: Diagnosis not present

## 2021-06-20 DIAGNOSIS — Z87891 Personal history of nicotine dependence: Secondary | ICD-10-CM | POA: Diagnosis not present

## 2021-06-20 DIAGNOSIS — N186 End stage renal disease: Secondary | ICD-10-CM | POA: Diagnosis not present

## 2021-06-20 DIAGNOSIS — Z992 Dependence on renal dialysis: Secondary | ICD-10-CM | POA: Insufficient documentation

## 2021-06-20 DIAGNOSIS — N185 Chronic kidney disease, stage 5: Secondary | ICD-10-CM | POA: Diagnosis not present

## 2021-06-20 DIAGNOSIS — E1122 Type 2 diabetes mellitus with diabetic chronic kidney disease: Secondary | ICD-10-CM | POA: Insufficient documentation

## 2021-06-20 HISTORY — PX: A/V SHUNTOGRAM: CATH118297

## 2021-06-20 LAB — POTASSIUM (ARMC VASCULAR LAB ONLY): Potassium (ARMC vascular lab): 3.7 (ref 3.5–5.1)

## 2021-06-20 LAB — GLUCOSE, CAPILLARY: Glucose-Capillary: 92 mg/dL (ref 70–99)

## 2021-06-20 SURGERY — A/V SHUNTOGRAM
Anesthesia: Moderate Sedation

## 2021-06-20 MED ORDER — METHYLPREDNISOLONE SODIUM SUCC 125 MG IJ SOLR: 125.0000 mg | Freq: Once | INTRAMUSCULAR | Status: DC | PRN

## 2021-06-20 MED ORDER — MIDAZOLAM HCL 2 MG/2ML IJ SOLN
INTRAMUSCULAR | Status: AC
  Filled 2021-06-20: qty 2

## 2021-06-20 MED ORDER — FAMOTIDINE 20 MG PO TABS: 40.0000 mg | ORAL_TABLET | Freq: Once | ORAL | Status: DC | PRN

## 2021-06-20 MED ORDER — MIDAZOLAM HCL 2 MG/ML PO SYRP: 8.0000 mg | ORAL_SOLUTION | Freq: Once | ORAL | Status: DC | PRN

## 2021-06-20 MED ORDER — SODIUM CHLORIDE 0.9 % IV SOLN: INTRAVENOUS | Status: DC

## 2021-06-20 MED ORDER — DIPHENHYDRAMINE HCL 50 MG/ML IJ SOLN: 50.0000 mg | Freq: Once | INTRAMUSCULAR | Status: DC | PRN

## 2021-06-20 MED ORDER — CEFAZOLIN SODIUM-DEXTROSE 1-4 GM/50ML-% IV SOLN
INTRAVENOUS | Status: AC
  Filled 2021-06-20: qty 50

## 2021-06-20 MED ORDER — HEPARIN SODIUM (PORCINE) 1000 UNIT/ML IJ SOLN
INTRAMUSCULAR | Status: AC
  Filled 2021-06-20: qty 10

## 2021-06-20 MED ORDER — MIDAZOLAM HCL 2 MG/2ML IJ SOLN
INTRAMUSCULAR | Status: DC | PRN
  Administered 2021-06-20: 1 mg via INTRAVENOUS

## 2021-06-20 MED ORDER — FENTANYL CITRATE (PF) 100 MCG/2ML IJ SOLN
INTRAMUSCULAR | Status: DC | PRN
  Administered 2021-06-20: 50 ug via INTRAVENOUS

## 2021-06-20 MED ORDER — HYDROMORPHONE HCL 1 MG/ML IJ SOLN: 1.0000 mg | Freq: Once | INTRAMUSCULAR | Status: DC | PRN

## 2021-06-20 MED ORDER — HEPARIN SODIUM (PORCINE) 1000 UNIT/ML IJ SOLN
INTRAMUSCULAR | Status: DC | PRN
  Administered 2021-06-20: 3000 [IU] via INTRAVENOUS

## 2021-06-20 MED ORDER — IODIXANOL 320 MG/ML IV SOLN
INTRAVENOUS | Status: DC | PRN
  Administered 2021-06-20: 20 mL

## 2021-06-20 MED ORDER — CEFAZOLIN SODIUM-DEXTROSE 1-4 GM/50ML-% IV SOLN
INTRAVENOUS | Status: DC | PRN
  Administered 2021-06-20: 1 g via INTRAVENOUS

## 2021-06-20 MED ORDER — ONDANSETRON HCL 4 MG/2ML IJ SOLN: 4.0000 mg | Freq: Four times a day (QID) | INTRAMUSCULAR | Status: DC | PRN

## 2021-06-20 MED ORDER — FENTANYL CITRATE PF 50 MCG/ML IJ SOSY
PREFILLED_SYRINGE | INTRAMUSCULAR | Status: AC
  Filled 2021-06-20: qty 1

## 2021-06-20 MED ORDER — CEFAZOLIN SODIUM-DEXTROSE 1-4 GM/50ML-% IV SOLN: 1.0000 g | Freq: Once | INTRAVENOUS | Status: DC

## 2021-06-20 SURGICAL SUPPLY — 10 items
BALLN LUTONIX AV 8X40X75 (BALLOONS) ×2
BALLOON LUTONIX AV 8X40X75 (BALLOONS) IMPLANT
CANNULA 5F STIFF (CANNULA) ×1 IMPLANT
COVER PROBE U/S 5X48 (MISCELLANEOUS) ×1 IMPLANT
DRAPE BRACHIAL (DRAPES) ×1 IMPLANT
KIT ENCORE 26 ADVANTAGE (KITS) ×1 IMPLANT
PACK ANGIOGRAPHY (CUSTOM PROCEDURE TRAY) ×2 IMPLANT
SHEATH BRITE TIP 6FRX5.5 (SHEATH) ×1 IMPLANT
SUT MNCRL AB 4-0 PS2 18 (SUTURE) ×1 IMPLANT
WIRE GUIDERIGHT .035X150 (WIRE) ×1 IMPLANT

## 2021-06-20 NOTE — Op Note (Signed)
[facility]    OPERATIVE NOTE   PROCEDURE: 1.  Left brachial artery to axillary vein arteriovenous graft cannulation under ultrasound guidance 2.  Left arm shuntogram 3.  Percutaneous transluminal angioplasty of mid left brachial artery to axillary vein AV graft with 8 mm diameter Lutonix drug-coated angioplasty balloon  PRE-OPERATIVE DIAGNOSIS: 1. ESRD 2. Malfunctioning left brachial artery to axillary vein arteriovenous graft  POST-OPERATIVE DIAGNOSIS: same as above   SURGEON: Leotis Pain, MD  ANESTHESIA: local with MCS  ESTIMATED BLOOD LOSS: 3 cc  FINDING(S): 65 to 70% stenosis in the mid graft from access sites.  The previously placed stent across the venous anastomosis was widely patent.  The central venous circulation was widely patent.  SPECIMEN(S):  None  CONTRAST: 20 cc  FLUORO TIME: 1 minute  MODERATE CONSCIOUS SEDATION TIME:  Approximately 21 minutes using 1 mg of Versed and 50 mcg of Fentanyl  INDICATIONS: Dylan Sanders is a 88 y.o. male who presents with malfunctioning left brachial artery to axillary vein arteriovenous graft.  The patient is scheduled for left arm shuntogram.  The patient is aware the risks include but are not limited to: bleeding, infection, thrombosis of the cannulated access, and possible anaphylactic reaction to the contrast.  The patient is aware of the risks of the procedure and elects to proceed forward.  DESCRIPTION: After full informed written consent was obtained, the patient was brought back to the angiography suite and placed supine upon the angiography table.  The patient was connected to monitoring equipment. Moderate conscious sedation was administered during a face to face encounter throughout the procedure with my supervision of the RN administering medicines and monitoring the patient's vital signs, pulse oximetry, telemetry and mental status throughout from the start of the procedure until the patient was  taken to the recovery room The left arm was prepped and draped in the standard fashion for a percutaneous access intervention.  Under ultrasound guidance, the left brachial artery to axillary vein arteriovenous graft was cannulated with a micropuncture needle under direct ultrasound guidance were it was patent and a permanent image was performed.  The microwire was advanced into the graft and the needle was exchanged for the a microsheath.  I then upsized to a 6 Fr Sheath and imaging was performed.  Hand injections were completed to image the access including the central venous system. This demonstrated 65 to 70% stenosis in the mid graft from access sites.  The previously placed stent across the venous anastomosis was widely patent.  The central venous circulation was widely patent.  Based on the images, this patient will need intervention to this mid graft stenosis. I then gave the patient 3000 units of intravenous heparin.  I then crossed the stenosis with a Magic Tourqe wire.  Based on the imaging, a 8 mm x 4 cm Lutonix drug-coated angioplasty balloon was selected.  The balloon was centered around the stenosis and inflated to 10 ATM for 1 minute(s).  On completion imaging, a 10% residual stenosis was present.     Based on the completion imaging, no further intervention is necessary.  The wire and balloon were removed from the sheath.  A 4-0 Monocryl purse-string suture was sewn around the sheath.  The sheath was removed while tying down the suture.  A sterile bandage was applied to the puncture site.  COMPLICATIONS: None  CONDITION: Stable   Leotis Pain  06/20/2021 11:18 AM    This note was created with [facility]  transcription system. Any errors in dictation are purely unintentional.

## 2021-06-20 NOTE — Interval H&P Note (Signed)
History and Physical Interval Note:  06/20/2021 10:02 AM  Dylan Sanders  has presented today for surgery, with the diagnosis of LT Arm Shuntogram   ESRD.  The various methods of treatment have been discussed with the patient and family. After consideration of risks, benefits and other options for treatment, the patient has consented to  Procedure(s): A/V SHUNTOGRAM (N/A) as a surgical intervention.  The patient's history has been reviewed, patient examined, no change in status, stable for surgery.  I have reviewed the patient's chart and labs.  Questions were answered to the patient's satisfaction.     Leotis Pain

## 2021-06-20 NOTE — Discharge Instructions (Signed)
Shuntogram, Care After °Refer to this sheet in the next few weeks. These instructions provide you with information on caring for yourself after your procedure. Your health care provider may also give you more specific instructions. Your treatment has been planned according to current medical practices, but problems sometimes occur. Call your health care provider if you have any problems or questions after your procedure. °What can I expect after the procedure? °After your procedure, it is typical to have the following: °· A small amount of discomfort in the area where the catheters were placed. °· A small amount of bruising around the fistula. °· Sleepiness and fatigue. ° °Follow these instructions at home: °· Rest at home for the day following your procedure. °· Do not drive or operate heavy machinery while taking pain medicine. °· Take medicines only as directed by your health care provider. °· Do not take baths, swim, or use a hot tub until your health care provider approves. You may shower 24 hours after the procedure or as directed by your health care provider. °· There are many different ways to close and cover an incision, including stitches, skin glue, and adhesive strips. Follow your health care provider's instructions on: °? Incision care. °? Bandage (dressing) changes and removal. °? Incision closure removal. °· Monitor your dialysis fistula carefully. °Contact a health care provider if: °· You have drainage, redness, swelling, or pain at your catheter site. °· You have a fever. °· You have chills. °Get help right away if: °· You feel weak. °· You have trouble balancing. °· You have trouble moving your arms or legs. °· You have problems with your speech or vision. °· You can no longer feel a vibration or buzz when you put your fingers over your dialysis fistula. °· The limb that was used for the procedure: °? Swells. °? Is painful. °? Is cold. °? Is discolored, such as blue or pale white. °This  information is not intended to replace advice given to you by your health care provider. Make sure you discuss any questions you have with your health care provider. °Document Released: 09/13/2013 Document Revised: 10/05/2015 Document Reviewed: 06/18/2013 °Elsevier Interactive Patient Education © 2018 [employer] ° °

## 2021-06-21 ENCOUNTER — Encounter: Payer: Self-pay | Admitting: Vascular Surgery

## 2021-06-26 DIAGNOSIS — Z992 Dependence on renal dialysis: Secondary | ICD-10-CM | POA: Diagnosis not present

## 2021-06-26 DIAGNOSIS — N186 End stage renal disease: Secondary | ICD-10-CM | POA: Diagnosis not present

## 2021-06-26 DIAGNOSIS — D631 Anemia in chronic kidney disease: Secondary | ICD-10-CM | POA: Diagnosis not present

## 2021-06-26 DIAGNOSIS — N2581 Secondary hyperparathyroidism of renal origin: Secondary | ICD-10-CM | POA: Diagnosis not present

## 2021-07-03 DIAGNOSIS — Z992 Dependence on renal dialysis: Secondary | ICD-10-CM | POA: Diagnosis not present

## 2021-07-03 DIAGNOSIS — N186 End stage renal disease: Secondary | ICD-10-CM | POA: Diagnosis not present

## 2021-07-03 DIAGNOSIS — N2581 Secondary hyperparathyroidism of renal origin: Secondary | ICD-10-CM | POA: Diagnosis not present

## 2021-07-04 ENCOUNTER — Emergency Department: Payer: PPO

## 2021-07-04 ENCOUNTER — Other Ambulatory Visit: Payer: Self-pay

## 2021-07-04 DIAGNOSIS — E785 Hyperlipidemia, unspecified: Secondary | ICD-10-CM | POA: Diagnosis present

## 2021-07-04 DIAGNOSIS — I462 Cardiac arrest due to underlying cardiac condition: Secondary | ICD-10-CM | POA: Diagnosis not present

## 2021-07-04 DIAGNOSIS — I2510 Atherosclerotic heart disease of native coronary artery without angina pectoris: Secondary | ICD-10-CM | POA: Diagnosis present

## 2021-07-04 DIAGNOSIS — R001 Bradycardia, unspecified: Secondary | ICD-10-CM | POA: Diagnosis not present

## 2021-07-04 DIAGNOSIS — I959 Hypotension, unspecified: Secondary | ICD-10-CM | POA: Diagnosis not present

## 2021-07-04 DIAGNOSIS — N2581 Secondary hyperparathyroidism of renal origin: Secondary | ICD-10-CM | POA: Diagnosis present

## 2021-07-04 DIAGNOSIS — R0602 Shortness of breath: Secondary | ICD-10-CM | POA: Diagnosis not present

## 2021-07-04 DIAGNOSIS — D472 Monoclonal gammopathy: Secondary | ICD-10-CM | POA: Diagnosis present

## 2021-07-04 DIAGNOSIS — I5041 Acute combined systolic (congestive) and diastolic (congestive) heart failure: Secondary | ICD-10-CM | POA: Diagnosis not present

## 2021-07-04 DIAGNOSIS — J9600 Acute respiratory failure, unspecified whether with hypoxia or hypercapnia: Secondary | ICD-10-CM | POA: Diagnosis not present

## 2021-07-04 DIAGNOSIS — Z888 Allergy status to other drugs, medicaments and biological substances status: Secondary | ICD-10-CM

## 2021-07-04 DIAGNOSIS — E1151 Type 2 diabetes mellitus with diabetic peripheral angiopathy without gangrene: Secondary | ICD-10-CM | POA: Diagnosis present

## 2021-07-04 DIAGNOSIS — R54 Age-related physical debility: Secondary | ICD-10-CM | POA: Diagnosis present

## 2021-07-04 DIAGNOSIS — Z452 Encounter for adjustment and management of vascular access device: Secondary | ICD-10-CM

## 2021-07-04 DIAGNOSIS — R0902 Hypoxemia: Secondary | ICD-10-CM | POA: Diagnosis not present

## 2021-07-04 DIAGNOSIS — Z66 Do not resuscitate: Secondary | ICD-10-CM | POA: Diagnosis present

## 2021-07-04 DIAGNOSIS — Z794 Long term (current) use of insulin: Secondary | ICD-10-CM

## 2021-07-04 DIAGNOSIS — I350 Nonrheumatic aortic (valve) stenosis: Secondary | ICD-10-CM | POA: Diagnosis present

## 2021-07-04 DIAGNOSIS — E1122 Type 2 diabetes mellitus with diabetic chronic kidney disease: Secondary | ICD-10-CM | POA: Diagnosis present

## 2021-07-04 DIAGNOSIS — I132 Hypertensive heart and chronic kidney disease with heart failure and with stage 5 chronic kidney disease, or end stage renal disease: Secondary | ICD-10-CM | POA: Diagnosis present

## 2021-07-04 DIAGNOSIS — J811 Chronic pulmonary edema: Secondary | ICD-10-CM | POA: Diagnosis not present

## 2021-07-04 DIAGNOSIS — Z7902 Long term (current) use of antithrombotics/antiplatelets: Secondary | ICD-10-CM

## 2021-07-04 DIAGNOSIS — I214 Non-ST elevation (NSTEMI) myocardial infarction: Secondary | ICD-10-CM | POA: Diagnosis present

## 2021-07-04 DIAGNOSIS — I2729 Other secondary pulmonary hypertension: Secondary | ICD-10-CM | POA: Diagnosis present

## 2021-07-04 DIAGNOSIS — I700 Atherosclerosis of aorta: Secondary | ICD-10-CM | POA: Diagnosis present

## 2021-07-04 DIAGNOSIS — Z79891 Long term (current) use of opiate analgesic: Secondary | ICD-10-CM

## 2021-07-04 DIAGNOSIS — Z515 Encounter for palliative care: Secondary | ICD-10-CM | POA: Diagnosis not present

## 2021-07-04 DIAGNOSIS — Z7952 Long term (current) use of systemic steroids: Secondary | ICD-10-CM

## 2021-07-04 DIAGNOSIS — J90 Pleural effusion, not elsewhere classified: Secondary | ICD-10-CM | POA: Diagnosis not present

## 2021-07-04 DIAGNOSIS — I701 Atherosclerosis of renal artery: Secondary | ICD-10-CM | POA: Diagnosis present

## 2021-07-04 DIAGNOSIS — R570 Cardiogenic shock: Secondary | ICD-10-CM | POA: Diagnosis present

## 2021-07-04 DIAGNOSIS — R0689 Other abnormalities of breathing: Secondary | ICD-10-CM | POA: Diagnosis not present

## 2021-07-04 DIAGNOSIS — D539 Nutritional anemia, unspecified: Secondary | ICD-10-CM | POA: Diagnosis present

## 2021-07-04 DIAGNOSIS — Z87891 Personal history of nicotine dependence: Secondary | ICD-10-CM

## 2021-07-04 DIAGNOSIS — I255 Ischemic cardiomyopathy: Secondary | ICD-10-CM | POA: Diagnosis present

## 2021-07-04 DIAGNOSIS — Z20822 Contact with and (suspected) exposure to covid-19: Secondary | ICD-10-CM | POA: Diagnosis present

## 2021-07-04 DIAGNOSIS — D631 Anemia in chronic kidney disease: Secondary | ICD-10-CM | POA: Diagnosis present

## 2021-07-04 DIAGNOSIS — I5043 Acute on chronic combined systolic (congestive) and diastolic (congestive) heart failure: Secondary | ICD-10-CM | POA: Diagnosis present

## 2021-07-04 DIAGNOSIS — Z95828 Presence of other vascular implants and grafts: Secondary | ICD-10-CM

## 2021-07-04 DIAGNOSIS — E872 Acidosis, unspecified: Secondary | ICD-10-CM | POA: Diagnosis present

## 2021-07-04 DIAGNOSIS — N186 End stage renal disease: Secondary | ICD-10-CM | POA: Diagnosis present

## 2021-07-04 DIAGNOSIS — Z96612 Presence of left artificial shoulder joint: Secondary | ICD-10-CM | POA: Diagnosis present

## 2021-07-04 DIAGNOSIS — Z992 Dependence on renal dialysis: Secondary | ICD-10-CM

## 2021-07-04 DIAGNOSIS — J9811 Atelectasis: Secondary | ICD-10-CM | POA: Diagnosis not present

## 2021-07-04 DIAGNOSIS — J9601 Acute respiratory failure with hypoxia: Secondary | ICD-10-CM | POA: Diagnosis present

## 2021-07-04 DIAGNOSIS — Z85038 Personal history of other malignant neoplasm of large intestine: Secondary | ICD-10-CM

## 2021-07-04 DIAGNOSIS — R579 Shock, unspecified: Secondary | ICD-10-CM | POA: Diagnosis not present

## 2021-07-04 DIAGNOSIS — Z7982 Long term (current) use of aspirin: Secondary | ICD-10-CM

## 2021-07-04 DIAGNOSIS — Z79899 Other long term (current) drug therapy: Secondary | ICD-10-CM

## 2021-07-04 DIAGNOSIS — I5021 Acute systolic (congestive) heart failure: Secondary | ICD-10-CM | POA: Diagnosis present

## 2021-07-04 DIAGNOSIS — Z96642 Presence of left artificial hip joint: Secondary | ICD-10-CM | POA: Diagnosis not present

## 2021-07-04 LAB — BASIC METABOLIC PANEL
Anion gap: 17 — ABNORMAL HIGH (ref 5–15)
BUN: 50 mg/dL — ABNORMAL HIGH (ref 8–23)
CO2: 27 mmol/L (ref 22–32)
Calcium: 9.3 mg/dL (ref 8.9–10.3)
Chloride: 92 mmol/L — ABNORMAL LOW (ref 98–111)
Creatinine, Ser: 6.82 mg/dL — ABNORMAL HIGH (ref 0.61–1.24)
GFR, Estimated: 7 mL/min — ABNORMAL LOW (ref >60)
Glucose, Bld: 194 mg/dL — ABNORMAL HIGH (ref 70–99)
Potassium: 4.8 mmol/L (ref 3.5–5.1)
Sodium: 136 mmol/L (ref 135–145)

## 2021-07-04 LAB — CBC
HCT: 27.9 % — ABNORMAL LOW (ref 39.0–52.0)
Hemoglobin: 9 g/dL — ABNORMAL LOW (ref 13.0–17.0)
MCH: 33.3 pg (ref 26.0–34.0)
MCHC: 32.3 g/dL (ref 30.0–36.0)
MCV: 103.3 fL — ABNORMAL HIGH (ref 80.0–100.0)
Platelets: 151 10*3/uL (ref 150–400)
RBC: 2.7 MIL/uL — ABNORMAL LOW (ref 4.22–5.81)
RDW: 15.2 % (ref 11.5–15.5)
WBC: 16.2 10*3/uL — ABNORMAL HIGH (ref 4.0–10.5)
nRBC: 0 % (ref 0.0–0.2)

## 2021-07-04 LAB — APTT: aPTT: 35 seconds (ref 24–36)

## 2021-07-04 LAB — TROPONIN I (HIGH SENSITIVITY): Troponin I (High Sensitivity): 7002 ng/L (ref <18)

## 2021-07-04 LAB — PROTIME-INR
INR: 1.2 (ref 0.8–1.2)
Prothrombin Time: 15.1 seconds (ref 11.4–15.2)

## 2021-07-04 MED ORDER — HEPARIN BOLUS VIA INFUSION
3700.0000 [IU] | Freq: Once | INTRAVENOUS | Status: AC
  Administered 2021-07-04: 3700 [IU] via INTRAVENOUS
  Filled 2021-07-04: qty 3700

## 2021-07-04 MED ORDER — SODIUM CHLORIDE 0.9 % IV BOLUS (SEPSIS)
500.0000 mL | Freq: Once | INTRAVENOUS | Status: AC
Start: 2021-07-04 — End: 2021-07-05
  Administered 2021-07-04: 500 mL via INTRAVENOUS

## 2021-07-04 MED ORDER — ASPIRIN 81 MG PO CHEW
324.0000 mg | CHEWABLE_TABLET | Freq: Once | ORAL | Status: AC
  Administered 2021-07-04: 324 mg via ORAL
  Filled 2021-07-04: qty 4

## 2021-07-04 MED ORDER — HEPARIN (PORCINE) 25000 UT/250ML-% IV SOLN
800.0000 [IU]/h | INTRAVENOUS | Status: DC
  Administered 2021-07-04: 800 [IU]/h via INTRAVENOUS
  Filled 2021-07-04: qty 250

## 2021-07-04 MED ORDER — NITROGLYCERIN 0.4 MG SL SUBL: 0.4000 mg | SUBLINGUAL_TABLET | SUBLINGUAL | Status: DC | PRN

## 2021-07-04 NOTE — ED Notes (Signed)
ED Provider at bedside. 

## 2021-07-04 NOTE — Progress Notes (Signed)
ANTICOAGULATION CONSULT NOTE  Pharmacy Consult for heparin infusion Indication: ACS/STEMI  Allergies  Allergen Reactions   Ace Inhibitors Other (See Comments)    Patient Measurements: Weight: 61.7 kg (136 lb) Heparin Dosing Weight: 61.7 kg  Vital Signs: Temp: 98.4 F (36.9 C) (02/22 2205) Temp Source: Oral (02/22 2205) BP: 97/52 (02/22 2330) Pulse Rate: 86 (02/22 2330)  Labs: Recent Labs    06/18/2021 2208  HGB 9.0*  HCT 27.9*  PLT 151  CREATININE 6.82*  TROPONINIHS 7,002*    Estimated Creatinine Clearance: 6.7 mL/min (A) (by C-G formula based on SCr of 6.82 mg/dL (H)).   Medical History: Past Medical History:  Diagnosis Date   Anemia    Cancer ([place]) 2008   colon   Chronic kidney disease    Diabetes mellitus without complication (HCC)    Heart murmur    asymptomatic   Hypertension    Assessment: Pt is 88 yo male w/ ESRD on HD, presenting to ED c/o SOB, found with elevated Troponin I, trending up.  Goal of Therapy:  Heparin level 0.3-0.7 units/ml Monitor platelets by anticoagulation protocol: Yes   Plan:  Bolus 3700 units x 1 Start heparin infusion at 800 units/hr Check HL in 8 hrs after start of infusion CBC daily while on heparin  Renda Rolls, PharmD, [facility] 06/21/2021 11:36 PM

## 2021-07-04 NOTE — Significant Event (Signed)
I was called regarding Dylan Sanders. He is a 88 yo M with ESRD, moderate to severe AS who presents with several days of shortness of breath and chest discomfort, worse since this afternoon. His ECG shows diffuse ST depression and elevation in AVR. Labs are notable for WBC 16K, hgb 9, and a troponin of 7K. At this point I believe he needs to be evaluated by the general cardiologist and attempt to manage his discomfort. His BP is low notably, so differentiation of his shock needs to evaluated (septic versus cardiogenic versus mixed). Certainly his ECG changes and discomfort could be the result of severe AS +/- underlying CAD in the setting of sepsis or other physiologic stressor.   Heparin and aspirin should be continued, and the patient should be evaluated by the general cardiologist on call. If felt emergent heart catheterization is felt to be important overnight please have the general cardiologist reach out directly to me to discuss.  Andrez Grime, MD

## 2021-07-04 NOTE — ED Provider Notes (Addendum)
[facility] Provider Note    Event Date/Time   First MD Initiated Contact with Patient 06/30/2021 2259     (approximate)   History   Shortness of Breath   HPI  Dylan Sanders is a 88 y.o. male with history of hypertension, diabetes, hyperlipidemia, end-stage renal disease on hemodialysis Tuesday/Thursday/Saturday, peripheral vascular disease, renal artery stenosis on Plavix , AV stenosis who presents to the emergency department with intermittent central chest pain that he describes as a "fullness" that has been ongoing intermittently since Sunday, February 19 and constant since 2 PM this afternoon.  Has felt short of breath, weak and tired but no nausea, vomiting, diaphoresis or dizziness.  No fever or cough.  No abdominal pain, diarrhea.  No chills, body aches.  No dysuria.  Reports he still makes urine.  Sees Dr. Esmond Plants with cardiology.  Denies previous history of cardiac catheterization.  Does not wear oxygen chronically.  Patient lives at home alone and is able to perform his ADLs.  Does not use any assistive devices for ambulation.   History provided by patient and family (son at bedside).   Echo 05/09/21:  IMPRESSIONS     1. Left ventricular ejection fraction, by estimation, is 55 to 60%. The  left ventricle has normal function. The left ventricle has no regional  wall motion abnormalities. There is mild left ventricular hypertrophy.  Left ventricular diastolic parameters  are consistent with Grade II diastolic dysfunction (pseudonormalization).   2. Right ventricular systolic function is normal. The right ventricular  size is normal.   3. The mitral valve is degenerative. No evidence of mitral valve  regurgitation. Mild mitral stenosis. The mean mitral valve gradient is 3.2  mmHg. Moderate mitral annular calcification.   4. Aortic valve mean gradient 52mmHg, peak gradient 24mmHg, DVI=0.25,  Vmax 3.84m/s, AVA 0.88cm2. The aortic valve is  tricuspid. Aortic valve  regurgitation is mild. Moderate to severe aortic valve stenosis.   Past Medical History:  Diagnosis Date   Anemia    Cancer ([place]) 2008   colon   Chronic kidney disease    Diabetes mellitus without complication ([place])    Heart murmur    asymptomatic   Hypertension     Past Surgical History:  Procedure Laterality Date   A/V SHUNT INTERVENTION Left 07/27/2019   Procedure: A/V SHUNT INTERVENTION;  Surgeon: Katha Cabal, MD;  Location: [place] [facility];  Service: Cardiovascular;  Laterality: Left;   A/V SHUNTOGRAM N/A 06/20/2021   Procedure: A/V SHUNTOGRAM;  Surgeon: Algernon Huxley, MD;  Location: [place] [facility];  Service: Cardiovascular;  Laterality: N/A;   AV FISTULA PLACEMENT Left 01/08/2019   Procedure: INSERTION OF ARTERIOVENOUS (AV) GORE-TEX GRAFT ARM ( BRACHIAL AXILLARY );  Surgeon: Katha Cabal, MD;  Location: [facility];  Service: Vascular;  Laterality: Left;   COLON SURGERY     EYE SURGERY     bilateral   RENAL ANGIOGRAPHY Bilateral 11/19/2016   Procedure: Renal Angiography;  Surgeon: Katha Cabal, MD;  Location: [place] [facility];  Service: Cardiovascular;  Laterality: Bilateral;   RENAL ANGIOGRAPHY Right 07/03/2018   Procedure: RENAL ANGIOGRAPHY;  Surgeon: Katha Cabal, MD;  Location: [place] [facility];  Service: Cardiovascular;  Laterality: Right;   REVERSE SHOULDER ARTHROPLASTY Left 09/19/2016   Procedure: REVERSE SHOULDER ARTHROPLASTY;  Surgeon: Corky Mull, MD;  Location: [facility];  Service: Orthopedics;  Laterality: Left;   ROTATOR CUFF REPAIR Right  MEDICATIONS:  Prior to Admission medications   Medication Sig Start Date End Date Taking? Authorizing Provider  allopurinol (ZYLOPRIM) 100 MG tablet Take 100 mg by mouth at bedtime.  08/31/15   [provider]  amLODipine (NORVASC) 10 MG tablet Take 1 tablet (10 mg total) by mouth every morning. 04/02/21   Minna Merritts, MD  aspirin EC 81 MG tablet  Take 81 mg by mouth at bedtime.     [provider]  azithromycin (ZITHROMAX) 250 MG tablet Take by mouth. Patient not taking: Reported on 06/20/2021 03/14/21   [provider]  B Complex-C-Folic Acid (RENA-VITE RX) 1 MG TABS Take 1 tablet by mouth daily. 08/11/19   [provider]  carvedilol (COREG) 3.125 MG tablet Take 1 tablet (3.125 mg total) by mouth 2 (two) times daily with a meal. 04/02/21   Gollan, Kathlene November, MD  cloNIDine (CATAPRES) 0.1 MG tablet Take 1 tablet (0.1 mg total) by mouth 2 (two) times daily. 04/02/21   Minna Merritts, MD  clopidogrel (PLAVIX) 75 MG tablet Take 1 tablet (75 mg total) by mouth daily. 04/02/21   Minna Merritts, MD  ferrous sulfate 325 (65 FE) MG EC tablet Take 1 tablet (325 mg total) by mouth 2 (two) times daily. Patient not taking: Reported on 06/20/2021 04/16/19   [name], MD  LANTUS SOLOSTAR 100 UNIT/ML Solostar Pen Inject 10 Units into the skin every morning.  03/28/16   [provider]  lidocaine-prilocaine (EMLA) cream SMARTSIG:Sparingly Topical 3 Times a Week 08/17/20   [provider]  losartan (COZAAR) 100 MG tablet Take 1 tablet (100 mg total) by mouth daily. 04/02/21   Minna Merritts, MD  Methoxy PEG-Epoetin Beta (MIRCERA IJ) Mircera 06/02/21 06/01/22  [provider]  ONETOUCH ULTRA test strip  03/31/21   [provider]  predniSONE (DELTASONE) 10 MG tablet Take by mouth. Patient not taking: Reported on 06/20/2021 03/14/21   [provider]  psyllium (METAMUCIL) 58.6 % powder Take 1 packet by mouth daily.    [provider]  sevelamer (RENAGEL) 800 MG tablet Take by mouth.    [provider]  sevelamer carbonate (RENVELA) 800 MG tablet Take 800 mg by mouth 3 (three) times daily. Patient not taking: Reported on 06/20/2021 01/26/20   [provider]  simvastatin (ZOCOR) 20 MG tablet Take 1 tablet (20 mg total) by mouth at bedtime. 04/02/21   Minna Merritts,  MD  telmisartan (MICARDIS) 80 MG tablet Take 1 tablet (80 mg total) by mouth every morning. 04/02/21 10/01/24  Minna Merritts, MD  traZODone (DESYREL) 50 MG tablet Take by mouth. 03/15/20 06/20/21  [provider]  vitamin B-12 (CYANOCOBALAMIN) 1000 MCG tablet Take 1 tablet (1,000 mcg total) by mouth daily. 03/19/19   [name], MD  vitamin C (ASCORBIC ACID) 500 MG tablet Take 1 tablet (500 mg total) by mouth daily. 04/16/19   [name], MD    Physical Exam   Triage Vital Signs: ED Triage Vitals  Enc Vitals Group     BP 06/29/2021 2205 (!) 107/48     Pulse Rate 06/24/2021 2205 82     Resp 06/14/2021 2159 (!) 26     Temp 06/18/2021 2205 98.4 F (36.9 C)     Temp Source 06/14/2021 2205 Oral     SpO2 07/09/2021 2159 98 %     Weight 06/20/2021 2202 136 lb (61.7 kg)     Height --  Head Circumference --      Peak Flow --      Pain Score 07/06/2021 2202 0     Pain Loc --      Pain Edu? --      Excl. in [place]? --     Most recent vital signs: Vitals:   2021/07/12 0833 07/12/21 0845  BP:  (!) 109/37  Pulse:    Resp:  (!) 22  Temp:    SpO2: 95%     CONSTITUTIONAL: Alert and oriented and responds appropriately to questions. Afebrile, elderly, appears uncomfortable HEAD: Normocephalic, atraumatic EYES: Conjunctivae clear, pupils appear equal, sclera nonicteric ENT: normal nose; moist mucous membranes NECK: Supple, normal ROM, no JVD CARD: RRR; S1 and S2 appreciated; + murmur, no clicks, no rubs, no gallops RESP: Normal chest excursion without splinting or tachypnea; breath sounds clear and equal bilaterally; no wheezes, no rhonchi, no rales, no hypoxia or respiratory distress, speaking full sentences ABD/GI: Normal bowel sounds; non-distended; soft, non-tender, no rebound, no guarding, no peritoneal signs RECTAL:  Normal rectal tone, no gross blood or melena, guaiac NEGATIVE, no hemorrhoids appreciated, nontender rectal exam, no fecal impaction. Chaperone present. BACK: The back appears  normal EXT: Normal ROM in all joints; no deformity noted, no edema; no cyanosis, no calf tenderness or calf swelling SKIN: Normal color for age and race; warm; no rash on exposed skin NEURO: Moves all extremities equally, normal speech PSYCH: The patient's mood and manner are appropriate.   ED Results / Procedures / Treatments   LABS: (all labs ordered are listed, but only abnormal results are displayed) Labs Reviewed  CBC - Abnormal; Notable for the following components:      Result Value   WBC 16.2 (*)    RBC 2.70 (*)    Hemoglobin 9.0 (*)    HCT 27.9 (*)    MCV 103.3 (*)    All other components within normal limits  BASIC METABOLIC PANEL - Abnormal; Notable for the following components:   Chloride 92 (*)    Glucose, Bld 194 (*)    BUN 50 (*)    Creatinine, Ser 6.82 (*)    GFR, Estimated 7 (*)    Anion gap 17 (*)    All other components within normal limits  BRAIN NATRIURETIC PEPTIDE - Abnormal; Notable for the following components:   B Natriuretic Peptide 2,968.3 (*)    All other components within normal limits  LACTIC ACID, PLASMA - Abnormal; Notable for the following components:   Lactic Acid, Venous 3.6 (*)    All other components within normal limits  D-DIMER, QUANTITATIVE - Abnormal; Notable for the following components:   D-Dimer, Quant 1.25 (*)    All other components within normal limits  CBC - Abnormal; Notable for the following components:   WBC 19.6 (*)    RBC 2.56 (*)    Hemoglobin 8.7 (*)    HCT 26.2 (*)    MCV 102.3 (*)    All other components within normal limits  BASIC METABOLIC PANEL - Abnormal; Notable for the following components:   Chloride 97 (*)    Glucose, Bld 199 (*)    BUN 53 (*)    Creatinine, Ser 6.95 (*)    GFR, Estimated 7 (*)    Anion gap 16 (*)    All other components within normal limits  GLUCOSE, CAPILLARY - Abnormal; Notable for the following components:   Glucose-Capillary 149 (*)    All other components within normal limits  TROPONIN I (HIGH SENSITIVITY) - Abnormal; Notable for the following components:   Troponin I (High Sensitivity) 7,002 (*)    All other components within normal limits  TROPONIN I (HIGH SENSITIVITY) - Abnormal; Notable for the following components:   Troponin I (High Sensitivity) 7,931 (*)    All other components within normal limits  TROPONIN I (HIGH SENSITIVITY) - Abnormal; Notable for the following components:   Troponin I (High Sensitivity) 8,877 (*)    All other components within normal limits  TROPONIN I (HIGH SENSITIVITY) - Abnormal; Notable for the following components:   Troponin I (High Sensitivity) 10,855 (*)    All other components within normal limits  RESP PANEL BY RT-PCR (FLU A&B, COVID) ARPGX2  CULTURE, BLOOD (ROUTINE X 2) W REFLEX TO ID PANEL  MRSA NEXT GEN BY PCR, NASAL  CULTURE, BLOOD (ROUTINE X 2) W REFLEX TO ID PANEL  APTT  PROTIME-INR  PROCALCITONIN  LACTIC ACID, PLASMA  MAGNESIUM  PHOSPHORUS  PROCALCITONIN  HEPARIN LEVEL (UNFRACTIONATED)  HEMOGLOBIN A1C  LEGIONELLA PNEUMOPHILA SEROGP 1 UR AG  STREP PNEUMONIAE URINARY ANTIGEN  URINALYSIS, COMPLETE (UACMP) WITH MICROSCOPIC  HEPARIN LEVEL (UNFRACTIONATED)  TYPE AND SCREEN     EKG:  EKG Interpretation  Date/Time:  Wednesday July 04 2021 22:02:27 EST Ventricular Rate:  88 PR Interval:  188 QRS Duration: 100 QT Interval:  376 QTC Calculation: 454 R Axis:   62 Text Interpretation: Normal sinus rhythm Septal infarct , age undetermined Marked ST abnormality, possible inferior subendocardial injury Abnormal ECG When compared with ECG of 25-Dec-2018 12:01, Significant changes have occurred Confirmed by Pryor Curia [phone]) on 06/18/2021 11:07:19 PM             RADIOLOGY: My personal review and interpretation of imaging: Chest x-ray shows small left-sided pleural effusion.  I have personally reviewed all radiology reports.   DG Chest 1 View  Result Date: 07-18-2021 CLINICAL DATA:  Central  line placement EXAM: CHEST  1 VIEW COMPARISON:  July 18, 2021 12:23 a.m. FINDINGS: Interval placement of a right IJ CVC with tip approximating the superior cavoatrial junction. Redemonstrated increased prominence of the perihilar pulmonary vasculature and diffusely increased interstitial markings. Elevation of the right hemidiaphragm. Normal cardiac and mediastinal contours. Aortic atherosclerosis. Left upper arm vascular stent. Status post left shoulder arthroplasty. No definite pleural effusion or pneumothorax. IMPRESSION: 1. Right IJ CVC with tip approximating the superior cavoatrial junction. 2. Redemonstrated findings concerning for pulmonary edema. Electronically Signed   By: Merilyn Baba M.D.   On: 07/18/2021 03:27   DG Chest 2 View  Result Date: 06/20/2021 CLINICAL DATA:  Shortness of breath for several days, initial encounter EXAM: CHEST - 2 VIEW COMPARISON:  07/19/2019 FINDINGS: Cardiac shadow is stable. Aortic calcifications are noted. Lungs are well aerated bilaterally. Minimal left basilar atelectasis is seen. Small left pleural effusion is noted. No bony abnormality is noted. Postsurgical changes of the left arm are noted. IMPRESSION: Mild left basilar atelectasis and effusion. Electronically Signed   By: Inez Catalina M.D.   On: 06/19/2021 22:21   DG Chest Portable 1 View  Result Date: 18-Jul-2021 CLINICAL DATA:  Worsening shortness of breath. EXAM: PORTABLE CHEST 1 VIEW COMPARISON:  None. FINDINGS: Mild, diffusely increased interstitial lung markings are seen. This is very mildly increased in severity when compared to the prior study. Mild prominence of the perihilar pulmonary vasculature is also noted. There is stable elevation of the right hemidiaphragm, without evidence of focal consolidation, pleural effusion or pneumothorax. The heart size and  mediastinal contours are within normal limits. Marked severity calcification of the thoracic aorta is seen. A radiopaque vascular stent is seen within  the medial aspect of the left upper extremity. There is an intact left shoulder replacement. IMPRESSION: Mild, diffusely increased interstitial lung markings, with a mild superimposed component of interstitial edema and pulmonary vascular congestion. Electronically Signed   By: Virgina Norfolk M.D.   On: Aug 04, 2021 00:26     PROCEDURES:  Critical Care performed: Yes, see critical care procedure note(s)   CRITICAL CARE Performed by: Pryor Curia   Total critical care time: 95 minutes  Critical care time was exclusive of separately billable procedures and treating other patients.  Critical care was necessary to treat or prevent imminent or life-threatening deterioration.  Critical care was time spent personally by me on the following activities: development of treatment plan with patient and/or surrogate as well as nursing, discussions with consultants, evaluation of patient's response to treatment, examination of patient, obtaining history from patient or surrogate, ordering and performing treatments and interventions, ordering and review of laboratory studies, ordering and review of radiographic studies, pulse oximetry and re-evaluation of patient's condition.   Marland Kitchen1-3 Lead EKG Interpretation Performed by: Gladie Gravette, Delice Bison, DO Authorized by: Myana Schlup, Delice Bison, DO     Interpretation: normal     ECG rate:  85   ECG rate assessment: normal     Rhythm: sinus rhythm     Ectopy: none     Conduction: normal      IMPRESSION / MDM / ASSESSMENT AND PLAN / ED COURSE  I reviewed the triage vital signs and the nursing notes.    Patient here with chest pain, shortness of breath, generalized weakness.  The patient is on the cardiac monitor to evaluate for evidence of arrhythmia and/or significant heart rate changes.   DIFFERENTIAL DIAGNOSIS (includes but not limited to):   ACS, PE, dissection, CHF exacerbation, pneumonia, pneumothorax   PLAN: We will obtain CBC, BMP, troponin, EKG, chest  x-ray.  Will give full dose aspirin.  Blood pressures are slightly soft compared to his previous in our records.  We will hold on nitroglycerin, morphine.  Will give fentanyl for pain.   MEDICATIONS GIVEN IN ED: Medications  heparin ADULT infusion 100 units/mL (25000 units/264mL) (800 Units/hr Intravenous Infusion Verify 2021/08/04 0800)  docusate sodium (COLACE) capsule 100 mg (has no administration in time range)  polyethylene glycol (MIRALAX / GLYCOLAX) packet 17 g (has no administration in time range)  0.9 %  sodium chloride infusion (0 mLs Intravenous Hold August 04, 2021 0349)  Chlorhexidine Gluconate Cloth 2 % PADS 6 each (has no administration in time range)  furosemide (LASIX) injection 20 mg (20 mg Intravenous Given 08-04-2021 0436)  norepinephrine (LEVOPHED) 16 mg in 284mL premix infusion (14 mcg/min Intravenous Infusion Verify 04-Aug-2021 0800)  Chlorhexidine Gluconate Cloth 2 % PADS 6 each (has no administration in time range)  sodium chloride flush (NS) 0.9 % injection 3 mL (has no administration in time range)  aspirin EC tablet 81 mg (has no administration in time range)  atorvastatin (LIPITOR) tablet 80 mg (has no administration in time range)  aspirin chewable tablet 324 mg (324 mg Oral Given 06/17/2021 2323)  sodium chloride 0.9 % bolus 500 mL (0 mLs Intravenous Stopped 04-Aug-2021 0000)  heparin bolus via infusion 3,700 Units (3,700 Units Intravenous Bolus from Bag 06/25/2021 2350)  sodium chloride 0.9 % bolus 500 mL (0 mLs Intravenous Stopped 2021/08/04 0041)  fentaNYL (SUBLIMAZE) injection 50 mcg (50 mcg Intravenous  Given 2021-07-20 0019)  ondansetron (ZOFRAN) injection 4 mg (4 mg Intravenous Given 07/20/21 0016)     ED COURSE: 11:35 PM  Patient's EKG is concerning for diffuse ischemia.  Repeat EKG looks like the ST depressions are worsening but no ST elevation to activate a code STEMI.  Initial troponin has come back elevated at 7000.  We will start heparin.  I did consult Dr. Stanford Breed who is on-call  for [facility] health cardiology covering for Dr. Rockey Situ who recommends calling on-call interventional cardiology as I am concerned that patient is having active pain, soft BPs, worsening ischemic EKG and may need cath lab more urgently for ACS/ischemia although not a STEMI.  Discussed with Dr. Corky Sox who is on-call for interventional cardiology at [facility].  He has reviewed patient's records and is concerned about his leukocytosis of 16,000 and feels he needs further work-up for possible infectious etiology that could be causing demand ischemia.  He states he would like the [facility] health cardiologist to see the patient in the emergency department to determine if he thinks the patient should go to the cardiac catheterization lab urgently.  We will add on blood cultures, procalcitonin, COVID and flu swab, urinalysis.  Rectal temp of 99.5.  Patient states he has not had any infectious symptoms.  I did discuss with the cardiologist on-call that I am concerned that his leukocytosis may be reactive in nature from his NSTEMI esp since he has had symptoms for several days intermittently.  Will admit to ICU.   On reevaluation, patient appears like he is feeling worse and states he feels more short of breath.  Now has an oxygen requirement of 3 L.  Does not wear O2 at home.  No h/o CHF. He does not look volume overloaded on exam.  His initial chest x-ray was reviewed by myself and the radiologist and shows a small left pleural effusion but no infiltrate or edema.  We will repeat a chest x-ray.  He has not missed any dialysis sessions, last dialysis was Tuesday.   Patient continues to have soft blood pressures.  We will start IV hydration but monitor closely.  No CHF on last echo in December 2022 but does have worsening AS.  Discussed case with ICU provider Rufina Falco NP with critical care.  She will contact cardiology again for more definitive plan.  She will see patient for admission.   12:55 AM  Pt seen by ICU provider.   He has worsening oxygen requirement and is now requiring a nonrebreather.  Repeat chest x-ray shows diffuse edema.  He did receive about a liter of fluid due to hypotension.  We will hold on further hydration and ICU provider to order Lasix.  He may need vasopressors given his systolic blood pressure is in the upper 80s-90s (MAPs mostly greater than 65).  His BNP has come back elevated at 2968.  Repeat troponin is now 7900.  COVID and flu are negative.  Repeat chest x-ray shows no pneumonia.  Procalcitonin is just minimally elevated at 0.62.  Urine pending.  No obvious signs of infection currently therefore will hold on antibiotics as I am more concerned for cardiogenic shock than distributive/septic shock.  Third EKG is stable.   1:30 AM  I was called to patient's bedside because his blood pressure now in the 92E systolic.  I changed out his blood pressure cuff for a smaller cuff and blood pressure still measuring 71/45.  Updated ICU provider.  We will hold on any diuretics  at this time and start Levophed.  I performed a bedside ultrasound which shows that he still has a decent ejection fraction and no pericardial effusion but does seem to have some left-sided wall motion abnormalities.  He still having chest discomfort but denies that it is worsening.  He is still hypoxic but transitioned off NRB.  He is requiring 6 L nasal cannula.  ICU provider updated and ICU provider to update cardiology.  1:50 AM  ICU provider is coming to the bedside as is Dr. Corky Sox with cardiology.  Patient is becoming more short of breath and is again on a nonrebreather.  May require BiPAP.  We are also having to increase his Levophed frequently.  Given patient still has good squeeze on bed side [place], I do not feel we need to start dopamine or dobutamine at this time and will continue to maximize therapy with levophed.   2:10 AM  Pt improving clinically on BiPAP.  I have also ordered an ABG.  ICU provider and interventional  cardiologist are currently at the bedside evaluating patient.    2:30 AM  Awaiting ICU bed.  Patient confirmed with ICU staff that he is a DNR/DNI.  Patient to go to cath lab later today per interventionalist.  Still having active chest tightness.  I reviewed all nursing notes and pertinent previous records as available.  I have reviewed and interpreted any and all EKGs, lab and urine results, imaging and radiology reports (as available).   CONSULTS: Consulted Dr. Stanford Breed and Dr. Denton Brick with [facility] health cardiology and interventional cardiology.  Consulted Rufina Falco, NP with [facility].  OUTSIDE RECORDS REVIEWED: Reviewed patient's most recent cardiology note with Dr. Rockey Situ on 05/17/2020.         FINAL CLINICAL IMPRESSION(S) / ED DIAGNOSES   Final diagnoses:  NSTEMI (non-ST elevated myocardial infarction) ([place])  Acute respiratory failure with hypoxia ([facility])  Cardiogenic shock ([place])     Rx / DC Orders   ED Discharge Orders     None        Note:  This document was prepared using Dragon voice recognition software and may include unintentional dictation errors.           Tristian Sickinger, Delice Bison, DO 07/10/21 [phone]

## 2021-07-04 NOTE — ED Triage Notes (Signed)
Pt presents via EMS c/o SOB since Sunday, reports CKD pt with last HD yesterday. EMS reports 94% on RA.

## 2021-07-05 ENCOUNTER — Inpatient Hospital Stay: Payer: PPO

## 2021-07-05 ENCOUNTER — Other Ambulatory Visit: Payer: Self-pay

## 2021-07-05 ENCOUNTER — Emergency Department: Payer: PPO

## 2021-07-05 ENCOUNTER — Encounter: Payer: Self-pay | Admitting: Cardiology

## 2021-07-05 ENCOUNTER — Encounter: Admission: EM | Disposition: E | Payer: Self-pay | Source: Home / Self Care | Attending: Internal Medicine

## 2021-07-05 ENCOUNTER — Inpatient Hospital Stay (HOSPITAL_COMMUNITY)
Admit: 2021-07-05 | Discharge: 2021-07-05 | Disposition: A | Discharge status: Home / Self Care | Payer: PPO | Attending: Cardiovascular Disease | Admitting: Cardiovascular Disease

## 2021-07-05 DIAGNOSIS — I5041 Acute combined systolic (congestive) and diastolic (congestive) heart failure: Secondary | ICD-10-CM | POA: Diagnosis not present

## 2021-07-05 DIAGNOSIS — N2581 Secondary hyperparathyroidism of renal origin: Secondary | ICD-10-CM | POA: Diagnosis not present

## 2021-07-05 DIAGNOSIS — I214 Non-ST elevation (NSTEMI) myocardial infarction: Principal | ICD-10-CM

## 2021-07-05 DIAGNOSIS — E1151 Type 2 diabetes mellitus with diabetic peripheral angiopathy without gangrene: Secondary | ICD-10-CM | POA: Diagnosis present

## 2021-07-05 DIAGNOSIS — R0602 Shortness of breath: Secondary | ICD-10-CM | POA: Diagnosis not present

## 2021-07-05 DIAGNOSIS — J9601 Acute respiratory failure with hypoxia: Secondary | ICD-10-CM

## 2021-07-05 DIAGNOSIS — J9600 Acute respiratory failure, unspecified whether with hypoxia or hypercapnia: Secondary | ICD-10-CM | POA: Diagnosis not present

## 2021-07-05 DIAGNOSIS — R570 Cardiogenic shock: Secondary | ICD-10-CM

## 2021-07-05 DIAGNOSIS — D631 Anemia in chronic kidney disease: Secondary | ICD-10-CM

## 2021-07-05 DIAGNOSIS — Z515 Encounter for palliative care: Secondary | ICD-10-CM | POA: Diagnosis not present

## 2021-07-05 DIAGNOSIS — J811 Chronic pulmonary edema: Secondary | ICD-10-CM | POA: Diagnosis not present

## 2021-07-05 DIAGNOSIS — E1122 Type 2 diabetes mellitus with diabetic chronic kidney disease: Secondary | ICD-10-CM | POA: Diagnosis present

## 2021-07-05 DIAGNOSIS — I350 Nonrheumatic aortic (valve) stenosis: Secondary | ICD-10-CM | POA: Diagnosis present

## 2021-07-05 DIAGNOSIS — I5043 Acute on chronic combined systolic (congestive) and diastolic (congestive) heart failure: Secondary | ICD-10-CM | POA: Diagnosis present

## 2021-07-05 DIAGNOSIS — I700 Atherosclerosis of aorta: Secondary | ICD-10-CM | POA: Diagnosis not present

## 2021-07-05 DIAGNOSIS — Z96642 Presence of left artificial hip joint: Secondary | ICD-10-CM | POA: Diagnosis not present

## 2021-07-05 DIAGNOSIS — I2729 Other secondary pulmonary hypertension: Secondary | ICD-10-CM | POA: Diagnosis present

## 2021-07-05 DIAGNOSIS — N186 End stage renal disease: Secondary | ICD-10-CM | POA: Diagnosis present

## 2021-07-05 DIAGNOSIS — D472 Monoclonal gammopathy: Secondary | ICD-10-CM | POA: Diagnosis present

## 2021-07-05 DIAGNOSIS — D539 Nutritional anemia, unspecified: Secondary | ICD-10-CM | POA: Diagnosis present

## 2021-07-05 DIAGNOSIS — E872 Acidosis, unspecified: Secondary | ICD-10-CM | POA: Diagnosis present

## 2021-07-05 DIAGNOSIS — Z66 Do not resuscitate: Secondary | ICD-10-CM | POA: Diagnosis present

## 2021-07-05 DIAGNOSIS — Z992 Dependence on renal dialysis: Secondary | ICD-10-CM

## 2021-07-05 DIAGNOSIS — Z20822 Contact with and (suspected) exposure to covid-19: Secondary | ICD-10-CM | POA: Diagnosis present

## 2021-07-05 DIAGNOSIS — Z452 Encounter for adjustment and management of vascular access device: Secondary | ICD-10-CM | POA: Diagnosis not present

## 2021-07-05 DIAGNOSIS — I462 Cardiac arrest due to underlying cardiac condition: Secondary | ICD-10-CM | POA: Diagnosis not present

## 2021-07-05 DIAGNOSIS — I2510 Atherosclerotic heart disease of native coronary artery without angina pectoris: Secondary | ICD-10-CM | POA: Diagnosis present

## 2021-07-05 DIAGNOSIS — I701 Atherosclerosis of renal artery: Secondary | ICD-10-CM | POA: Diagnosis present

## 2021-07-05 DIAGNOSIS — I132 Hypertensive heart and chronic kidney disease with heart failure and with stage 5 chronic kidney disease, or end stage renal disease: Secondary | ICD-10-CM | POA: Diagnosis present

## 2021-07-05 DIAGNOSIS — I255 Ischemic cardiomyopathy: Secondary | ICD-10-CM | POA: Diagnosis present

## 2021-07-05 HISTORY — PX: RIGHT HEART CATH AND CORONARY ANGIOGRAPHY: CATH118264

## 2021-07-05 HISTORY — PX: ABDOMINAL AORTOGRAM: CATH118222

## 2021-07-05 LAB — CBC
HCT: 26.2 % — ABNORMAL LOW (ref 39.0–52.0)
Hemoglobin: 8.7 g/dL — ABNORMAL LOW (ref 13.0–17.0)
MCH: 34 pg (ref 26.0–34.0)
MCHC: 33.2 g/dL (ref 30.0–36.0)
MCV: 102.3 fL — ABNORMAL HIGH (ref 80.0–100.0)
Platelets: 179 10*3/uL (ref 150–400)
RBC: 2.56 MIL/uL — ABNORMAL LOW (ref 4.22–5.81)
RDW: 15.3 % (ref 11.5–15.5)
WBC: 19.6 10*3/uL — ABNORMAL HIGH (ref 4.0–10.5)
nRBC: 0 % (ref 0.0–0.2)

## 2021-07-05 LAB — ECHOCARDIOGRAM COMPLETE
AR max vel: 0.62 cm2
AV Area VTI: 0.62 cm2
AV Area mean vel: 0.54 cm2
AV Mean grad: 39.8 mmHg
AV Peak grad: 59.9 mmHg
Ao pk vel: 3.87 m/s
Area-P 1/2: 5.38 cm2
Height: 68 in
MV M vel: 5.54 m/s
MV Peak grad: 122.8 mmHg
P 1/2 time: 175 msec
S' Lateral: 4.1 cm
Weight: 2215.18 oz

## 2021-07-05 LAB — LACTIC ACID, PLASMA
Lactic Acid, Venous: 3.6 mmol/L (ref 0.5–1.9)
Lactic Acid, Venous: 1.6 mmol/L (ref 0.5–1.9)

## 2021-07-05 LAB — RESP PANEL BY RT-PCR (FLU A&B, COVID) ARPGX2
Influenza A by PCR: NEGATIVE
Influenza B by PCR: NEGATIVE
SARS Coronavirus 2 by RT PCR: NEGATIVE

## 2021-07-05 LAB — TYPE AND SCREEN
ABO/RH(D): O POS
Antibody Screen: NEGATIVE

## 2021-07-05 LAB — MRSA NEXT GEN BY PCR, NASAL: MRSA by PCR Next Gen: NOT DETECTED

## 2021-07-05 LAB — BASIC METABOLIC PANEL
Anion gap: 16 — ABNORMAL HIGH (ref 5–15)
BUN: 53 mg/dL — ABNORMAL HIGH (ref 8–23)
CO2: 23 mmol/L (ref 22–32)
Calcium: 8.9 mg/dL (ref 8.9–10.3)
Chloride: 97 mmol/L — ABNORMAL LOW (ref 98–111)
Creatinine, Ser: 6.95 mg/dL — ABNORMAL HIGH (ref 0.61–1.24)
GFR, Estimated: 7 mL/min — ABNORMAL LOW (ref >60)
Glucose, Bld: 199 mg/dL — ABNORMAL HIGH (ref 70–99)
Potassium: 4.6 mmol/L (ref 3.5–5.1)
Sodium: 136 mmol/L (ref 135–145)

## 2021-07-05 LAB — BLOOD GAS, ARTERIAL
Acid-base deficit: 12.4 mmol/L — ABNORMAL HIGH (ref 0.0–2.0)
Bicarbonate: 17.9 mmol/L — ABNORMAL LOW (ref 20.0–28.0)
O2 Saturation: 80.4 %
Patient temperature: 37
pCO2 arterial: 59 mmHg — ABNORMAL HIGH (ref 32–48)
pH, Arterial: 7.09 — CL (ref 7.35–7.45)
pO2, Arterial: 57 mmHg — ABNORMAL LOW (ref 83–108)

## 2021-07-05 LAB — TROPONIN I (HIGH SENSITIVITY)
Troponin I (High Sensitivity): 10855 ng/L (ref <18)
Troponin I (High Sensitivity): 7931 ng/L (ref <18)
Troponin I (High Sensitivity): 8877 ng/L (ref <18)

## 2021-07-05 LAB — D-DIMER, QUANTITATIVE: D-Dimer, Quant: 1.25 ug/mL-FEU — ABNORMAL HIGH (ref 0.00–0.50)

## 2021-07-05 LAB — BRAIN NATRIURETIC PEPTIDE: B Natriuretic Peptide: 2968.3 pg/mL — ABNORMAL HIGH (ref 0.0–100.0)

## 2021-07-05 LAB — GLUCOSE, CAPILLARY: Glucose-Capillary: 149 mg/dL — ABNORMAL HIGH (ref 70–99)

## 2021-07-05 LAB — PROCALCITONIN
Procalcitonin: 0.62 ng/mL
Procalcitonin: 0.63 ng/mL

## 2021-07-05 LAB — MAGNESIUM: Magnesium: 2 mg/dL (ref 1.7–2.4)

## 2021-07-05 LAB — PHOSPHORUS: Phosphorus: 4.4 mg/dL (ref 2.5–4.6)

## 2021-07-05 LAB — HEPARIN LEVEL (UNFRACTIONATED): Heparin Unfractionated: 0.3 IU/mL (ref 0.30–0.70)

## 2021-07-05 SURGERY — RIGHT HEART CATH AND CORONARY ANGIOGRAPHY
Anesthesia: Moderate Sedation

## 2021-07-05 MED ORDER — ATORVASTATIN CALCIUM 20 MG PO TABS
80.0000 mg | ORAL_TABLET | Freq: Every day | ORAL | Status: DC
  Administered 2021-07-05: 80 mg via ORAL
  Filled 2021-07-05: qty 4

## 2021-07-05 MED ORDER — MORPHINE BOLUS VIA INFUSION
5.0000 mg | INTRAVENOUS | Status: DC | PRN
  Filled 2021-07-05: qty 5

## 2021-07-05 MED ORDER — HEPARIN (PORCINE) IN NACL 2000-0.9 UNIT/L-% IV SOLN
INTRAVENOUS | Status: DC | PRN
  Administered 2021-07-05: 1000 mL

## 2021-07-05 MED ORDER — NOREPINEPHRINE 4 MG/250ML-% IV SOLN
2.0000 ug/min | INTRAVENOUS | Status: DC
  Administered 2021-07-05: 15 ug/min via INTRAVENOUS

## 2021-07-05 MED ORDER — NOREPINEPHRINE 4 MG/250ML-% IV SOLN
INTRAVENOUS | Status: AC
  Administered 2021-07-05: 10 ug/min via INTRAVENOUS
  Filled 2021-07-05: qty 250

## 2021-07-05 MED ORDER — DEXTROSE 5 % IV SOLN: INTRAVENOUS | Status: DC

## 2021-07-05 MED ORDER — IOHEXOL 300 MG/ML  SOLN
INTRAMUSCULAR | Status: DC | PRN
Start: 2021-07-05 — End: 2021-07-05
  Administered 2021-07-05: 62 mL

## 2021-07-05 MED ORDER — LIDOCAINE HCL 1 % IJ SOLN
INTRAMUSCULAR | Status: AC
  Filled 2021-07-05: qty 20

## 2021-07-05 MED ORDER — EPINEPHRINE PF 1 MG/ML IJ SOLN
INTRAMUSCULAR | Status: DC | PRN
  Administered 2021-07-05 (×2): 1 mg

## 2021-07-05 MED ORDER — VASOPRESSIN 20 UNITS/100 ML INFUSION FOR SHOCK
0.0000 [IU]/min | INTRAVENOUS | Status: DC
  Filled 2021-07-05: qty 100

## 2021-07-05 MED ORDER — ACETAMINOPHEN 325 MG PO TABS: 650.0000 mg | ORAL_TABLET | Freq: Four times a day (QID) | ORAL | Status: DC | PRN

## 2021-07-05 MED ORDER — GLYCOPYRROLATE 0.2 MG/ML IJ SOLN
0.2000 mg | INTRAMUSCULAR | Status: DC | PRN
  Filled 2021-07-05: qty 1

## 2021-07-05 MED ORDER — ASPIRIN EC 81 MG PO TBEC
81.0000 mg | DELAYED_RELEASE_TABLET | Freq: Every day | ORAL | Status: DC
  Administered 2021-07-05: 81 mg via ORAL
  Filled 2021-07-05: qty 1

## 2021-07-05 MED ORDER — SODIUM CHLORIDE 0.9% FLUSH
3.0000 mL | Freq: Two times a day (BID) | INTRAVENOUS | Status: DC
  Administered 2021-07-05: 3 mL via INTRAVENOUS

## 2021-07-05 MED ORDER — DIPHENHYDRAMINE HCL 50 MG/ML IJ SOLN: 25.0000 mg | INTRAMUSCULAR | Status: DC | PRN

## 2021-07-05 MED ORDER — SODIUM CHLORIDE 0.9 % IV BOLUS (SEPSIS)
500.0000 mL | Freq: Once | INTRAVENOUS | Status: AC
  Administered 2021-07-05: 500 mL via INTRAVENOUS

## 2021-07-05 MED ORDER — MORPHINE SULFATE (PF) 4 MG/ML IV SOLN
INTRAVENOUS | Status: DC | PRN
  Administered 2021-07-05: 2 mg via INTRAVENOUS

## 2021-07-05 MED ORDER — GLYCOPYRROLATE 1 MG PO TABS
1.0000 mg | ORAL_TABLET | ORAL | Status: DC | PRN
  Filled 2021-07-05: qty 1

## 2021-07-05 MED ORDER — FUROSEMIDE 10 MG/ML IJ SOLN
20.0000 mg | Freq: Two times a day (BID) | INTRAMUSCULAR | Status: DC
  Administered 2021-07-05: 20 mg via INTRAVENOUS
  Filled 2021-07-05: qty 2

## 2021-07-05 MED ORDER — PERFLUTREN LIPID MICROSPHERE
1.0000 mL | INTRAVENOUS | Status: AC | PRN
  Administered 2021-07-05: 2 mL via INTRAVENOUS
  Filled 2021-07-05: qty 10

## 2021-07-05 MED ORDER — SODIUM CHLORIDE 0.9 % IV SOLN: 250.0000 mL | INTRAVENOUS | Status: DC

## 2021-07-05 MED ORDER — ACETAMINOPHEN 650 MG RE SUPP
650.0000 mg | Freq: Four times a day (QID) | RECTAL | Status: DC | PRN
  Filled 2021-07-05: qty 1

## 2021-07-05 MED ORDER — EPINEPHRINE 1 MG/10ML IJ SOSY
PREFILLED_SYRINGE | INTRAMUSCULAR | Status: AC
  Filled 2021-07-05: qty 20

## 2021-07-05 MED ORDER — HEPARIN (PORCINE) IN NACL 1000-0.9 UT/500ML-% IV SOLN
INTRAVENOUS | Status: AC
  Filled 2021-07-05: qty 1000

## 2021-07-05 MED ORDER — CHLORHEXIDINE GLUCONATE CLOTH 2 % EX PADS
6.0000 | MEDICATED_PAD | Freq: Every day | CUTANEOUS | Status: DC
  Administered 2021-07-05: 6 via TOPICAL

## 2021-07-05 MED ORDER — MORPHINE SULFATE (PF) 4 MG/ML IV SOLN: 2.0000 mg | INTRAVENOUS | Status: DC | PRN

## 2021-07-05 MED ORDER — DOCUSATE SODIUM 100 MG PO CAPS: 100.0000 mg | ORAL_CAPSULE | Freq: Two times a day (BID) | ORAL | Status: DC | PRN

## 2021-07-05 MED ORDER — MORPHINE SULFATE (PF) 4 MG/ML IV SOLN
INTRAVENOUS | Status: AC
  Filled 2021-07-05: qty 1

## 2021-07-05 MED ORDER — SODIUM BICARBONATE 8.4 % IV SOLN
INTRAVENOUS | Status: AC
  Filled 2021-07-05: qty 50

## 2021-07-05 MED ORDER — CHLORHEXIDINE GLUCONATE CLOTH 2 % EX PADS
6.0000 | MEDICATED_PAD | Freq: Every day | CUTANEOUS | Status: DC
  Administered 2021-07-05: 6 via TOPICAL
  Filled 2021-07-05: qty 6

## 2021-07-05 MED ORDER — FENTANYL CITRATE PF 50 MCG/ML IJ SOSY
50.0000 ug | PREFILLED_SYRINGE | Freq: Once | INTRAMUSCULAR | Status: AC
  Administered 2021-07-05: 50 ug via INTRAVENOUS
  Filled 2021-07-05: qty 1

## 2021-07-05 MED ORDER — ONDANSETRON HCL 4 MG/2ML IJ SOLN
4.0000 mg | Freq: Once | INTRAMUSCULAR | Status: AC
  Administered 2021-07-05: 4 mg via INTRAVENOUS
  Filled 2021-07-05: qty 2

## 2021-07-05 MED ORDER — MORPHINE 100MG IN NS 100ML (1MG/ML) PREMIX INFUSION
0.0000 mg/h | INTRAVENOUS | Status: DC
  Filled 2021-07-05: qty 100

## 2021-07-05 MED ORDER — POLYETHYLENE GLYCOL 3350 17 G PO PACK: 17.0000 g | PACK | Freq: Every day | ORAL | Status: DC | PRN

## 2021-07-05 MED ORDER — POLYVINYL ALCOHOL 1.4 % OP SOLN
1.0000 [drp] | Freq: Four times a day (QID) | OPHTHALMIC | Status: DC | PRN
  Filled 2021-07-05: qty 15

## 2021-07-05 MED ORDER — NOREPINEPHRINE 16 MG/250ML-% IV SOLN
0.0000 ug/min | INTRAVENOUS | Status: DC
  Administered 2021-07-05: 15 ug/min via INTRAVENOUS
  Filled 2021-07-05: qty 250

## 2021-07-05 MED ORDER — SODIUM BICARBONATE 8.4 % IV SOLN
INTRAVENOUS | Status: DC | PRN
  Administered 2021-07-05: 50 meq via INTRAVENOUS

## 2021-07-05 MED ORDER — LIDOCAINE HCL (PF) 1 % IJ SOLN
INTRAMUSCULAR | Status: DC | PRN
Start: 2021-07-05 — End: 2021-07-05
  Administered 2021-07-05: 10 mL

## 2021-07-05 SURGICAL SUPPLY — 12 items
BIOPATCH RED 1 DISK 7.0 (GAUZE/BANDAGES/DRESSINGS) ×2 IMPLANT
CANNULA 5F STIFF (CANNULA) ×1 IMPLANT
CATH INFINITI 5FR MULTPACK ANG (CATHETERS) ×1 IMPLANT
CATH SWAN GANZ VIP 7.5F (CATHETERS) ×1 IMPLANT
PACK CARDIAC CATH (CUSTOM PROCEDURE TRAY) ×2 IMPLANT
PROTECTION STATION PRESSURIZED (MISCELLANEOUS) ×2
SET ATX SIMPLICITY (MISCELLANEOUS) ×1 IMPLANT
SHEATH AVANTI 5FR X 11CM (SHEATH) ×1 IMPLANT
SHEATH BRITE TIP 8FRX11 (SHEATH) ×1 IMPLANT
SHEATH PINNACLE 7F 10CM (SHEATH) IMPLANT
STATION PROTECTION PRESSURIZED (MISCELLANEOUS) IMPLANT
WIRE GUIDERIGHT .035X150 (WIRE) ×1 IMPLANT

## 2021-07-06 LAB — HEMOGLOBIN A1C
Hgb A1c MFr Bld: 5.8 % — ABNORMAL HIGH (ref 4.8–5.6)
Mean Plasma Glucose: 120 mg/dL

## 2021-07-11 NOTE — Progress Notes (Signed)
GOALS OF CARE DISCUSSION  The Clinical status was relayed to family in detail- Son in Cath Lab Patient with acute cardiac arrest s/p cardiac cath  Updated and notified of patients medical condition- Patient remains unresponsive and will not open eyes to command.   Patient is having a weak cough and struggling to remove secretions.   Patient with increased WOB and using accessory muscles to breathe Explained to family course of therapy and the modalities      Patient with Progressive multiorgan failure with a very high probablity of a very minimal chance of meaningful recovery despite all aggressive and optimal medical therapy.   Family understands the situation.  They have consented and agreed to DNR/DNI and would like to proceed with Comfort care measures.  Family are satisfied with Plan of action and management. All questions answered  Additional CC time 45 mins   Dylan Sanders Dylan Sanders, M.D.  [facility]  Medical Director [place] Director [facility] Cardio-Pulmonary Department

## 2021-07-11 NOTE — H&P (Addendum)
NAME:  Dylan Sanders, MRN:  [id number], DOB:  04/13/1932, LOS: 0 ADMISSION DATE:  06/16/2021, CONSULTATION DATE: 07/08/2021 REFERRING MD:  Michel Harrow, MD, CHIEF COMPLAINT: Chest pain and shortness of breath   HPI  88 y.o  male with significant PMH  Renal artery stenosis s/p stent 06/2018, CKD stage IV, End-stage renal disease on HD, Hyperlipidemia, T2DM, Carotid bruit, Aortic valve disease, Aortic valve stenosis, Hypertension and  Anemia of chronic diseasewho presented to the ED with chief complaints of passive shortness of breath since 2/19.  Patient called EMS due to worsening symptoms of shortness of breath associated with chest discomfort and tightness.  Denied nausea vomiting, mental pain, diaphoresis, fevers or chills.  Patient reports that his last dialysis was on Monday.  ED Course: In the ED, the temperature was 36.6C, the heart rate 26 beats/minute, the blood pressure 107/48 mm Hg, the respiratory rate 26 breaths/minute, and the oxygen saturation 83% on. He was alert and oriented x4.  Initial EKG obtained was concerning for diffuse ischemia, repeat showed ST depression in the inferior leads.  Patient did not meet STEMI criteria however findings were discussed with on-call cardiologist and STEMI Dr On call who both felt there was no emergent need for intervention.  Initial chest x-ray showed small left pleural effusion with no infiltrate or edema.  Patient was placed on oxygen via nasal cannula at 3 L. Pertinent Labs in Red/Diagnostics Findings: Na+/ K+: 136/4.8 Glucose: 94 BUN/Cr.:  50/6.82  WBC/ TMAX: 16.2/ afebrile Hgb/Hct: 9.0/27.9 PCT: 0.62 Lactic acid: 3.6 COVID PCR: Negative   Troponin: 7000~7931~8877~10855 BNP: 2968  Patient was noted to be hypertensive with blood pressure in the low 70s.  He received 1 L IV fluid bolus with minimal improvement.  His oxygen requirement increased from 3 L to 8 then to 10.  On reevaluation by EDP, patient was noted to have increased work  of breathing therefore was placed on BiPAP with improvement.  Initial work-up above showed leukocytosis with no obvious clinical findings for possible sepsis.  Repeat chest x-ray showed no pneumonia, Procalcitonin minimally elevated at 0.62, UA and blood cultures pending.  Given no obvious signs of infection, antibiotics were held and patient started on Levophed for cardiogenic shock and possible distributive shock.  PCCM consulted for admission.  Past Medical History  Renal artery stenosis s/p stent 06/2018 CKD stage IV End-stage renal disease on HD Hyperlipidemia T2DM Carotid bruit Aortic valve disease Aortic valve stenosis Hypertension Anemia of chronic disease  Significant Hospital Events   2/23:  Consults:  Nephrology Cardiology  Procedures:  2/23: Right IJ central line  Significant Diagnostic Tests:  2/23: Chest Xray>Mild, diffusely increased interstitial lung markings, with a mild superimposed component of interstitial edema and pulmonary vascular congestion  Micro Data:  2/22: SARS-CoV-2 PCR> negative 2/22: Influenza PCR> negative 2/23: Blood culture x2> 2/23: Urine Culture> 2/23: MRSA PCR>>  2/23: Strep pneumo urinary antigen> 2/23: Legionella urinary antigen>  Antimicrobials:  None  OBJECTIVE  Blood pressure (!) 84/49, pulse 82, temperature 99.5 F (37.5 C), temperature source Rectal, resp. rate 18, weight 61.7 kg, SpO2 99 %.        Intake/Output Summary (Last 24 hours) at 07-08-2021 0132 Last data filed at 07-08-21 0041 Gross per 24 hour  Intake 999 ml  Output --  Net 999 ml   Filed Weights   06/17/2021 2202  Weight: 61.7 kg   Physical Examination  GENERAL: 88 year-old critically ill patient lying in the bed in mild respiratory distress  on BiPAP EYES: Pupils equal, round, reactive to light and accommodation. No scleral icterus. Extraocular muscles intact.  HEENT: Head atraumatic, normocephalic. Oropharynx and nasopharynx clear.  Very hard of  hearing NECK:  Supple, no jugular venous distention. No thyroid enlargement, no tenderness.  LUNGS: Decreased breath sounds bilaterally, no wheezing, mild rales,rhonchi.  Mild use of accessory muscles of respiration.  CARDIOVASCULAR: S1, S2 normal. murmurs present, rubs, or gallops.  ABDOMEN: Soft, nontender, nondistended. Bowel sounds present. No organomegaly or mass.  EXTREMITIES: No pedal edema, cyanosis, or clubbing.  NEUROLOGIC: Cranial nerves II through XII are intact.  Muscle strength 5/5 in all extremities. Sensation intact. Gait not checked.  PSYCHIATRIC: The patient is alert and oriented x 3.  SKIN: No obvious rash, lesion, or ulcer.   Labs/imaging that I havepersonally reviewed  (right click and "Reselect all SmartList Selections" daily)    Labs   CBC: Recent Labs  Lab 06/13/2021 2208  WBC 16.2*  HGB 9.0*  HCT 27.9*  MCV 103.3*  PLT 786    Basic Metabolic Panel: Recent Labs  Lab 07/07/2021 2208  NA 136  K 4.8  CL 92*  CO2 27  GLUCOSE 194*  BUN 50*  CREATININE 6.82*  CALCIUM 9.3   GFR: Estimated Creatinine Clearance: 6.7 mL/min (A) (by C-G formula based on SCr of 6.82 mg/dL (H)). Recent Labs  Lab 06/22/2021 2208  PROCALCITON 0.62  WBC 16.2*    Liver Function Tests: No results for input(s): AST, ALT, ALKPHOS, BILITOT, PROT, ALBUMIN in the last 168 hours. No results for input(s): LIPASE, AMYLASE in the last 168 hours. No results for input(s): AMMONIA in the last 168 hours.  ABG No results found for: PHART, PCO2ART, PO2ART, HCO3, TCO2, ACIDBASEDEF, O2SAT   Coagulation Profile: Recent Labs  Lab 07/06/2021 2208  INR 1.2    Cardiac Enzymes: No results for input(s): CKTOTAL, CKMB, CKMBINDEX, TROPONINI in the last 168 hours.  HbA1C: No results found for: HGBA1C  CBG: No results for input(s): GLUCAP in the last 168 hours.  Review of Systems:   Patient on BiPAP unable to obtain  Past Medical History  He,  has a past medical history of Anemia, Cancer  ([place]) (2008), Chronic kidney disease, Diabetes mellitus without complication ([place]), Heart murmur, and Hypertension.   Surgical History    Past Surgical History:  Procedure Laterality Date   A/V SHUNT INTERVENTION Left 07/27/2019   Procedure: A/V SHUNT INTERVENTION;  Surgeon: Katha Cabal, MD;  Location: [facility];  Service: Cardiovascular;  Laterality: Left;   A/V SHUNTOGRAM N/A 06/20/2021   Procedure: A/V SHUNTOGRAM;  Surgeon: Algernon Huxley, MD;  Location: [facility];  Service: Cardiovascular;  Laterality: N/A;   AV FISTULA PLACEMENT Left 01/08/2019   Procedure: INSERTION OF ARTERIOVENOUS (AV) GORE-TEX GRAFT ARM ( BRACHIAL AXILLARY );  Surgeon: Katha Cabal, MD;  Location: [facility];  Service: Vascular;  Laterality: Left;   COLON SURGERY     EYE SURGERY     bilateral   RENAL ANGIOGRAPHY Bilateral 11/19/2016   Procedure: Renal Angiography;  Surgeon: Katha Cabal, MD;  Location: [facility];  Service: Cardiovascular;  Laterality: Bilateral;   RENAL ANGIOGRAPHY Right 07/03/2018   Procedure: RENAL ANGIOGRAPHY;  Surgeon: Katha Cabal, MD;  Location: [facility];  Service: Cardiovascular;  Laterality: Right;   REVERSE SHOULDER ARTHROPLASTY Left 09/19/2016   Procedure: REVERSE SHOULDER ARTHROPLASTY;  Surgeon: Corky Mull, MD;  Location: [facility];  Service: Orthopedics;  Laterality: Left;   ROTATOR CUFF REPAIR Right      Social History   reports that he quit smoking about 44 years ago. His smoking use included pipe, cigarettes, and cigars. He has never used smokeless tobacco. He reports current alcohol use. He reports that he does not use drugs.   Family History   His family history includes Cancer in his father.   Allergies Allergies  Allergen Reactions   Ace Inhibitors Other (See Comments)     Home Medications  Prior to Admission medications   Medication Sig Start Date End Date Taking? Authorizing Provider  allopurinol  (ZYLOPRIM) 100 MG tablet Take 100 mg by mouth at bedtime.  08/31/15   [provider]  amLODipine (NORVASC) 10 MG tablet Take 1 tablet (10 mg total) by mouth every morning. 04/02/21   Minna Merritts, MD  aspirin EC 81 MG tablet Take 81 mg by mouth at bedtime.     [provider]  azithromycin (ZITHROMAX) 250 MG tablet Take by mouth. Patient not taking: Reported on 06/20/2021 03/14/21   [provider]  B Complex-C-Folic Acid (RENA-VITE RX) 1 MG TABS Take 1 tablet by mouth daily. 08/11/19   [provider]  carvedilol (COREG) 3.125 MG tablet Take 1 tablet (3.125 mg total) by mouth 2 (two) times daily with a meal. 04/02/21   Gollan, Kathlene November, MD  cloNIDine (CATAPRES) 0.1 MG tablet Take 1 tablet (0.1 mg total) by mouth 2 (two) times daily. 04/02/21   Minna Merritts, MD  clopidogrel (PLAVIX) 75 MG tablet Take 1 tablet (75 mg total) by mouth daily. 04/02/21   Minna Merritts, MD  ferrous sulfate 325 (65 FE) MG EC tablet Take 1 tablet (325 mg total) by mouth 2 (two) times daily. Patient not taking: Reported on 06/20/2021 04/16/19   [name], MD  LANTUS SOLOSTAR 100 UNIT/ML Solostar Pen Inject 10 Units into the skin every morning.  03/28/16   [provider]  lidocaine-prilocaine (EMLA) cream SMARTSIG:Sparingly Topical 3 Times a Week 08/17/20   [provider]  losartan (COZAAR) 100 MG tablet Take 1 tablet (100 mg total) by mouth daily. 04/02/21   Minna Merritts, MD  Methoxy PEG-Epoetin Beta (MIRCERA IJ) Mircera 06/02/21 06/01/22  [provider]  ONETOUCH ULTRA test strip  03/31/21   [provider]  predniSONE (DELTASONE) 10 MG tablet Take by mouth. Patient not taking: Reported on 06/20/2021 03/14/21   [provider]  psyllium (METAMUCIL) 58.6 % powder Take 1 packet by mouth daily.    [provider]  sevelamer (RENAGEL) 800 MG tablet Take by mouth.    [provider]  sevelamer carbonate (RENVELA) 800 MG  tablet Take 800 mg by mouth 3 (three) times daily. Patient not taking: Reported on 06/20/2021 01/26/20   [provider]  simvastatin (ZOCOR) 20 MG tablet Take 1 tablet (20 mg total) by mouth at bedtime. 04/02/21   Minna Merritts, MD  telmisartan (MICARDIS) 80 MG tablet Take 1 tablet (80 mg total) by mouth every morning. 04/02/21 10/01/24  Minna Merritts, MD  traZODone (DESYREL) 50 MG tablet Take by mouth. 03/15/20 06/20/21  [provider]  vitamin B-12 (CYANOCOBALAMIN) 1000 MCG tablet Take 1 tablet (1,000 mcg total) by mouth daily. 03/19/19   [name], MD  vitamin C (ASCORBIC ACID) 500 MG tablet Take 1 tablet (500 mg total) by mouth daily. 04/16/19   [name], MD    Scheduled Meds:  Chlorhexidine Gluconate Cloth  6 each Topical Daily   furosemide  20 mg Intravenous BID   Continuous Infusions:  sodium chloride Stopped (07/09/21 0349)   heparin 800 Units/hr (09-Jul-2021 0400)   norepinephrine (LEVOPHED) Adult infusion 15 mcg/min (July 09, 2021 0442)   PRN Meds:.docusate sodium, polyethylene glycol  Active Hospital Problem list     Assessment & Plan:  NSTEMI Cardiogenic +/- Septic shock in the setting of acute Decompensated CHF and moderate to severe Aortic Stenosis EKG shows diffuse ST depression with elevated troponin at 7000~7931~8877~10855 -Serial EKG -Trend troponin -ASA 81mg  PO daily (initial 162-324mg  = 2-4x 81mg  chewable tablets) -Anti-coagulation (Heparin drip per ACS protocol) -Start high-dose statin Atorvastatin 80mg  PO daily, check lipid panel  -Hold the blocker for hypertension/ cardiogenic shock)  -Hold nitroglycerin for hypotensive / cardiogenic shock  -Continue vasopressors for MAP> 65 -Cardiology Consult.  Above finding discussed with on-call cardiologist Dr. Stanford Breed as well as STEMI doctor on call, Dr. Corky Sox. Patient evaluated at the bedside by STEMI doctor on call who recommends no urgent intervention with probable cath in the morning.  Acute Hypoxic  Respiratory Failure secondary to pulmonary Edema -Supplemental O2 as needed to maintain O2 saturations 88 to 92% -BiPAP, wean as tolerated -Follow intermittent ABG and chest x-ray as needed -Repeat CXR with a diffusely increased interstitial lung markings with interstitial edema and pulmonary vascular congestion -IV Lasix as blood pressure and renal function permits; currently on Lasix 20 mg IV BID -As needed bronchodilators   Acute HFrEF (Approximate EF of 35 to 40% per bedside Echo by Cardiology -Hypertension Hx: CAD, HLD  -Elevated BNP 2968 -Continuous cardiac monitoring -Maintain MAP greater than 65 -IV Lasix as blood pressure and renal function permits; currently on Lasix 40 mg IV BID -Hold Norvasc, losartan, telmisartan, clonidine and carvedilol -Cardiology following, appreciate input -Repeat 2D Echocardiogram  Leukocytosis likely reactive in the setting of NSTEMI No obvious source of infection, chest x-ray and UA negative -F/u cultures, trend lactic/ PCT -Monitor WBC/ fever curve -Hold antibiotics for now pending cultures -Pressors for MAP goal >65 -Strict I/O's  ESRD on HD Last HD on 2/19 Mild AGMA with Lactic Acidosis -Monitor I&O's / urinary output -Follow BMP -Ensure adequate renal perfusion -Avoid nephrotoxic agents as able -Replace electrolytes as indicated -Nephrology consult   T2 Diabetes mellitus -CBGs -Sliding scale insulin -Follow ICU hyper/hypoglycemia protocol -Hold home Meds  Anemia of Chronic Disease Hgb 9.0 drop from 12.5 4 months ago -Follow H&H -IV iron and EPO per nephrologist -Check iron panel and ferritin levels -Monitor for signs and symptoms of bleeding while on heparin    Best practice:  Diet:  NPO Pain/Anxiety/Delirium protocol (if indicated): No VAP protocol (if indicated): Not indicated DVT prophylaxis: Systemic AC GI prophylaxis: H2B Glucose control:  SSI Yes Central venous access:  Yes, and it is still needed Arterial  line:  N/A Foley:  Yes, and it is still needed Mobility:  bed rest  PT consulted: N/A Last date of multidisciplinary goals of care discussion [2/22] Code Status:  DNR Disposition: ICU   = Goals of Care = Code Status Order: DNR  Primary Emergency Contact: [place] Patient wishes to pursue ongoing treatment, but concurred that if deteriorated to pulselessness, patient would prefer a natural death as opposed to invasive measures such as CPR and intubation.  He however would like to be in intubated for procedure showed the need arises  Critical care time: 45 minutes     Rufina Falco, DNP, CCRN, FNP-C, AGACNP-BC Acute Care Nurse Practitioner  [facility] Pager: [phone] [place] [facility]  .

## 2021-07-11 NOTE — ED Notes (Addendum)
Consent form signed and understood by pt and family to insert a central line

## 2021-07-11 NOTE — Progress Notes (Signed)
[facility]  ROUNDING NOTE   Subjective:  Patient well-known to [place] as we follow him for outpatient hemodialysis treatments. He normally dialyzes on TTS schedule. He did go to treatment on Tuesday. Currently on BiPAP.  Came in with chest pain and shortness of breath. He was found to have cardiogenic shock. Troponin was also quite elevated. Heart catheterization is being planned this AM.  Objective:  Vital signs in last 24 hours:  Temp:  [98.4 F (36.9 C)-99.5 F (37.5 C)] 99.1 F (37.3 C) (02/23 0349) Pulse Rate:  [73-92] 79 (02/23 0349) Resp:  [14-28] 19 (02/23 0545) BP: (76-120)/(34-63) 120/63 (02/23 0545) SpO2:  [88 %-100 %] 96 % (02/23 0349) FiO2 (%):  [50 %] 50 % (02/23 0349) Weight:  [61.7 kg-62.8 kg] 62.8 kg (02/23 0349)  Weight change:  Filed Weights   06/29/2021 2202 19-Jul-2021 0349  Weight: 61.7 kg 62.8 kg    Intake/Output: I/O last 3 completed shifts: In: 1202.7 [I.V.:203.7; IV Piggyback:999] Out: -    Intake/Output this shift:  No intake/output data recorded.  Physical Exam: General: Mild respiratory distress  Head: Normocephalic, atraumatic. Moist oral mucosal membranes  Eyes: Anicteric  Neck: Supple  Lungs:  Basilar rales, normal effort  Heart: S1S2 no rubs  Abdomen:  Soft, nontender, bowel sounds present  Extremities: No peripheral edema.  Neurologic: Awake, alert, following commands  Skin: No acute rash  Access: Left upper extremity AV fistula    Basic Metabolic Panel: Recent Labs  Lab 07/09/2021 2208 2021-07-19 0427  NA 136 136  K 4.8 4.6  CL 92* 97*  CO2 27 23  GLUCOSE 194* 199*  BUN 50* 53*  CREATININE 6.82* 6.95*  CALCIUM 9.3 8.9  MG  --  2.0  PHOS  --  4.4    Liver Function Tests: No results for input(s): AST, ALT, ALKPHOS, BILITOT, PROT, ALBUMIN in the last 168 hours. No results for input(s): LIPASE, AMYLASE in the last 168 hours. No results for input(s): AMMONIA in the last 168 hours.  CBC: Recent Labs  Lab  06/23/2021 2208 2021-07-19 0427  WBC 16.2* 19.6*  HGB 9.0* 8.7*  HCT 27.9* 26.2*  MCV 103.3* 102.3*  PLT 151 179    Cardiac Enzymes: No results for input(s): CKTOTAL, CKMB, CKMBINDEX, TROPONINI in the last 168 hours.  BNP: Invalid input(s): POCBNP  CBG: Recent Labs  Lab 07-19-21 0337  GLUCAP 149*    Microbiology: Results for orders placed or performed during the hospital encounter of 07/06/2021  Resp Panel by RT-PCR (Flu A&B, Covid) Nasopharyngeal Swab     Status: None   Collection Time: 06/25/2021 11:24 PM   Specimen: Nasopharyngeal Swab; Nasopharyngeal(NP) swabs in vial transport medium  Result Value Ref Range Status   SARS Coronavirus 2 by RT PCR NEGATIVE NEGATIVE Final    Comment: (NOTE) SARS-CoV-2 target nucleic acids are NOT DETECTED.  The SARS-CoV-2 RNA is generally detectable in upper respiratory specimens during the acute phase of infection. The lowest concentration of SARS-CoV-2 viral copies this assay can detect is 138 copies/mL. A negative result does not preclude SARS-Cov-2 infection and should not be used as the sole basis for treatment or other patient management decisions. A negative result may occur with  improper specimen collection/handling, submission of specimen other than nasopharyngeal swab, presence of viral mutation(s) within the areas targeted by this assay, and inadequate number of viral copies(<138 copies/mL). A negative result must be combined with clinical observations, patient history, and epidemiological information. The expected result is Negative.  Fact Sheet for Patients:  EntrepreneurPulse.com.au  Fact Sheet for Healthcare Providers:  IncredibleEmployment.be  This test is no t yet approved or cleared by the [place] FDA and  has been authorized for detection and/or diagnosis of SARS-CoV-2 by FDA under an Emergency Use Authorization (EUA). This EUA will remain  in effect (meaning this test can be  used) for the duration of the COVID-19 declaration under Section 564(b)(1) of the Act, 21 U.S.C.section 360bbb-3(b)(1), unless the authorization is terminated  or revoked sooner.       Influenza A by PCR NEGATIVE NEGATIVE Final   Influenza B by PCR NEGATIVE NEGATIVE Final    Comment: (NOTE) The Xpert Xpress SARS-CoV-2/FLU/RSV plus assay is intended as an aid in the diagnosis of influenza from Nasopharyngeal swab specimens and should not be used as a sole basis for treatment. Nasal washings and aspirates are unacceptable for Xpert Xpress SARS-CoV-2/FLU/RSV testing.  Fact Sheet for Patients: EntrepreneurPulse.com.au  Fact Sheet for Healthcare Providers: IncredibleEmployment.be  This test is not yet approved or cleared by the [place] FDA and has been authorized for detection and/or diagnosis of SARS-CoV-2 by FDA under an Emergency Use Authorization (EUA). This EUA will remain in effect (meaning this test can be used) for the duration of the COVID-19 declaration under Section 564(b)(1) of the Act, 21 U.S.C. section 360bbb-3(b)(1), unless the authorization is terminated or revoked.  Performed at [facility], [address] [place], [place] [address]   Culture, blood (Routine X 2) w Reflex to ID Panel     Status: None (Preliminary result)   Collection Time: 06/15/2021 11:52 PM   Specimen: BLOOD  Result Value Ref Range Status   Specimen Description BLOOD RIGHT ASSIST CONTROL  Final   Special Requests   Final    BOTTLES DRAWN AEROBIC AND ANAEROBIC Blood Culture results may not be optimal due to an excessive volume of blood received in culture bottles   Culture   Final    NO GROWTH < 12 HOURS Performed at [facility], [address] [place], [place] [address]    Report Status PENDING  Incomplete  MRSA Next Gen by PCR, Nasal     Status: None   Collection Time: July 29, 2021  3:52 AM   Specimen: Nasal Mucosa; Nasal Swab   Result Value Ref Range Status   MRSA by PCR Next Gen NOT DETECTED NOT DETECTED Final    Comment: (NOTE) The GeneXpert MRSA Assay (FDA approved for NASAL specimens only), is one component of a comprehensive MRSA colonization surveillance program. It is not intended to diagnose MRSA infection nor to guide or monitor treatment for MRSA infections. Test performance is not FDA approved in patients less than 15 years old. Performed at [facility], [address] [place],  [address]     Coagulation Studies: Recent Labs    07/06/2021 08/20/2206  LABPROT 15.1  INR 1.2    Urinalysis: No results for input(s): COLORURINE, LABSPEC, PHURINE, GLUCOSEU, HGBUR, BILIRUBINUR, KETONESUR, PROTEINUR, UROBILINOGEN, NITRITE, LEUKOCYTESUR in the last 72 hours.  Invalid input(s): APPERANCEUR    Imaging: DG Chest 1 View  Result Date: Jul 29, 2021 CLINICAL DATA:  Central line placement EXAM: CHEST  1 VIEW COMPARISON:  2021-07-29 12:23 a.m. FINDINGS: Interval placement of a right IJ CVC with tip approximating the superior cavoatrial junction. Redemonstrated increased prominence of the perihilar pulmonary vasculature and diffusely increased interstitial markings. Elevation of the right hemidiaphragm. Normal cardiac and mediastinal contours. Aortic atherosclerosis. Left upper arm vascular stent. Status post  left shoulder arthroplasty. No definite pleural effusion or pneumothorax. IMPRESSION: 1. Right IJ CVC with tip approximating the superior cavoatrial junction. 2. Redemonstrated findings concerning for pulmonary edema. Electronically Signed   By: Merilyn Baba M.D.   On: 2021/07/21 03:27   DG Chest 2 View  Result Date: 06/16/2021 CLINICAL DATA:  Shortness of breath for several days, initial encounter EXAM: CHEST - 2 VIEW COMPARISON:  07/19/2019 FINDINGS: Cardiac shadow is stable. Aortic calcifications are noted. Lungs are well aerated bilaterally. Minimal left basilar atelectasis is seen. Small left  pleural effusion is noted. No bony abnormality is noted. Postsurgical changes of the left arm are noted. IMPRESSION: Mild left basilar atelectasis and effusion. Electronically Signed   By: Inez Catalina M.D.   On: 06/21/2021 22:21   DG Chest Portable 1 View  Result Date: 07/21/2021 CLINICAL DATA:  Worsening shortness of breath. EXAM: PORTABLE CHEST 1 VIEW COMPARISON:  None. FINDINGS: Mild, diffusely increased interstitial lung markings are seen. This is very mildly increased in severity when compared to the prior study. Mild prominence of the perihilar pulmonary vasculature is also noted. There is stable elevation of the right hemidiaphragm, without evidence of focal consolidation, pleural effusion or pneumothorax. The heart size and mediastinal contours are within normal limits. Marked severity calcification of the thoracic aorta is seen. A radiopaque vascular stent is seen within the medial aspect of the left upper extremity. There is an intact left shoulder replacement. IMPRESSION: Mild, diffusely increased interstitial lung markings, with a mild superimposed component of interstitial edema and pulmonary vascular congestion. Electronically Signed   By: Virgina Norfolk M.D.   On: 07/21/21 00:26     Medications:    sodium chloride Stopped (07-21-21 0349)   heparin 800 Units/hr (July 21, 2021 0400)   norepinephrine (LEVOPHED) Adult infusion 15 mcg/min (July 21, 2021 0442)    Chlorhexidine Gluconate Cloth  6 each Topical Daily   furosemide  20 mg Intravenous BID   docusate sodium, polyethylene glycol  Assessment/ Plan:  88 y.o. male with past medical history of ESRD on HD TTS, diabetes mellitus type 2, renal artery stenosis, hyperlipidemia, hypertension, aortic stenosis, anemia of chronic kidney disease, secondary hyperparathyroidism, history of colon cancer who presents now with probable acute coronary syndrome, cardiogenic shock.  [facility] [address]/TTS/LUE AVF  1.  ESRD on HD TTS.  Patient is  due for hemodialysis today.  However he also has cardiac catheterization planned for today.  We will plan for dialysis after cardiac catheterization.  We will set ultrafiltration target of 1.5 to 2 kg.  2.  Acute respiratory failure.  Currently on BiPAP.  Continue BiPAP support at this time.  3.  Hypotension/cardiogenic shock.  Maintain on norepinephrine at 15 mcg/min.  4.  Anemia of chronic kidney disease.  Hemoglobin currently 8.7.  Consider starting Epogen soon.  5.  Secondary hyperparathyroidism.  Serum phosphorus 4.4 and acceptable.   LOS: 0 Arieh Bogue 03-11-237:41 AM

## 2021-07-11 NOTE — ED Notes (Signed)
Cardiologist at bedside.  

## 2021-07-11 NOTE — Progress Notes (Addendum)
Echocardiogram reviewed, Severely depressed ejection fraction estimated 25% global hypokinesis, Moderate to severe MR Severe aortic valve stenosis Moderately elevated right heart pressures Mild to moderate TR  Results above discussed with Dr. Ellyn Hack in catheterization lab Acute drop in ejection fraction compared to prior study dated May 09, 2021 likely indicating underlying coronary disease, left main or multivessel  Called to the catheterization lab during the case, severe RCA and LAD disease discussed Decision made for CRRT in ICU Calls made to advanced heart failure clinic in [facility] Dr. Aundra Dubin and bensimhon for their advice and discuss possible transfer  Shortly after end of catheterization case, worsening hypotension, patient reporting shortness of breath Started on pressors with escalating dosing to maintain systolic pressure of 177 Acutely developed PEA, CPR started  I was able to find the patient's son in the ICU, brought him down to the catheterization lab area and discussed ongoing resuscitation efforts Patient and son wished for CPR to be stopped On relaying this information to the team in the catheterization lab, chest compressions were stopped, On review of telemetry, heart rate had been restored, thready pulse, on high-dose pressors, systolic pressure 11-657, patient was not mentating Bag mask ventilation continued Discussed various treatment options with the team in the catheterization lab, It was recommended that the patient's son help guide [place] whether to pursue aggressive care or a more palliative approach given numerous comorbidities  Patient's son willing to step into the catheterization lab to see his father After further discussion, patient and son wish to withdraw care and stop bag mask ventilation, stop pressors His wishes were initiated I walked the patient son up to the ICU to retrieve his belongings, he indicated desire to make some family phone calls  in the family room He was to meet with the chaplain Patient expired soon after the catheterization lab   Total encounter time more than 60 minutes  Greater than 50% was spent in counseling and coordination of care with the patient   Signed, Esmond Plants, MD, Ph.D [facility]

## 2021-07-11 NOTE — ED Notes (Signed)
Pt currently 93% on 8 liters nasal cannula with increased work of breathing. Pt placed on 15 L non rebreather

## 2021-07-11 NOTE — ED Notes (Signed)
Per Dr. Leonides Schanz respiratory is called to place pt on bipap due to increased work of breathing

## 2021-07-11 NOTE — Progress Notes (Signed)
Pt transported to ICU 10 on the Bipap without incident. Pt remains on the Bipap and is tol well at this time.

## 2021-07-11 NOTE — Procedures (Signed)
Central Venous Catheter Insertion Procedure Note  TRENNON TORBECK  [id number]  [birth year]  Date:07-06-21  Time:3:35 AM   Provider Performing:Rolanda Campa A Sieanna Vanstone   Procedure: Insertion of Non-tunneled Central Venous [phone]) with US guidance ([id number])   Indication(s) Medication administration and Difficult access  Consent Risks of the procedure as well as the alternatives and risks of each were explained to the patient and/or caregiver.  Consent for the procedure was obtained and is signed in the bedside chart  Anesthesia Topical only with 1% lidocaine   Timeout Verified patient identification, verified procedure, site/side was marked, verified correct patient position, special equipment/implants available, medications/allergies/relevant history reviewed, required imaging and test results available.  Sterile Technique Maximal sterile technique including full sterile barrier drape, hand hygiene, sterile gown, sterile gloves, mask, hair covering, sterile ultrasound probe cover (if used).  Procedure Description Area of catheter insertion was cleaned with chlorhexidine and draped in sterile fashion.  With real-time ultrasound guidance a central venous catheter was placed into the right internal jugular vein. Nonpulsatile blood flow and easy flushing noted in all ports.  The catheter was sutured in place and sterile dressing applied.  Complications/Tolerance None; patient tolerated the procedure well. Chest X-ray is ordered to verify placement for internal jugular or subclavian cannulation.   Chest x-ray is not ordered for femoral cannulation.  EBL Minimal  Specimen(s) None    Rufina Falco, DNP, CCRN, FNP-C, AGACNP-BC Acute Care Nurse Practitioner  [facility] Pager: [phone] [place] at [facility]

## 2021-07-11 NOTE — ED Notes (Signed)
Levophed increased to 41mcg/min per verbal order pr Dr. Leonides Schanz

## 2021-07-11 NOTE — ED Notes (Signed)
X-ray at bedside

## 2021-07-11 NOTE — Death Summary Note (Signed)
DEATH SUMMARY   Patient Details  Name: Dylan Sanders MRN: [id number] DOB: 10-08-1931  Admission/Discharge Information   Admit Date:  2021-07-23  Date of Death:  07-24-21   Time of Death:  1451-08-15  Length of Stay: 0  Referring Physician: [name], MD   Reason(s) for Hospitalization  ISCHEMIC CARDIOMYOPATHY  Diagnoses  Preliminary cause of death: ISCHEMIC CARDIOMYOPATHY, SEVERE AS Secondary Diagnoses (including complications and co-morbidities):  Principal Problem:   NSTEMI (non-ST elevated myocardial infarction) [facility])   Brief Hospital Course (including significant findings, care, treatment, and services provided and events leading to death)   88 y.o  male with significant PMH  Renal artery stenosis s/p stent 06/2018, CKD stage IV, End-stage renal disease on HD, Hyperlipidemia, T2DM, Carotid bruit, Aortic valve disease, Aortic valve stenosis, Hypertension and  Anemia of chronic diseasewho presented to the ED with chief complaints of passive shortness of breath since 2/19.   Patient called EMS due to worsening symptoms of shortness of breath associated with chest discomfort and tightness.  Denied nausea vomiting, mental pain, diaphoresis, fevers or chills.  Patient reports that his last dialysis was on Monday.   ED Course: In the ED, the temperature was 36.6C, the heart rate 26 beats/minute, the blood pressure 107/48 mm Hg, the respiratory rate 26 breaths/minute, and the oxygen saturation 83% on. He was alert and oriented x4.  Initial EKG obtained was concerning for diffuse ischemia, repeat showed ST depression in the inferior leads.  Patient did not meet STEMI criteria however findings were discussed with on-call cardiologist and STEMI Dr On call who both felt there was no emergent need for intervention.  Initial chest x-ray showed small left pleural effusion with no infiltrate or edema.  Patient was placed on oxygen via nasal cannula at 3 L. Pertinent Labs in Red/Diagnostics  Findings: Na+/ K+: 136/4.8 Glucose: 94 BUN/Cr.:  50/6.82   WBC/ TMAX: 16.2/ afebrile Hgb/Hct: 9.0/27.9 PCT: 0.62 Lactic acid: 3.6 COVID PCR: Negative   Troponin: 7000~7931~8877~10855 BNP: 08/15/66   Patient was noted to be hypertensive with blood pressure in the low 70s.  He received 1 L IV fluid bolus with minimal improvement.  His oxygen requirement increased from 3 L to 8 then to 10.  On reevaluation by EDP, patient was noted to have increased work of breathing therefore was placed on BiPAP with improvement.  Initial work-up above showed leukocytosis with no obvious clinical findings for possible sepsis.  Repeat chest x-ray showed no pneumonia, Procalcitonin minimally elevated at 0.62, UA and blood cultures pending.  Given no obvious signs of infection, antibiotics were held and patient started on Levophed for cardiogenic shock and possible distributive shock.  PCCM consulted for admission.   Past Medical History  Renal artery stenosis s/p stent 06/2018 CKD stage IV End-stage renal disease on HD Hyperlipidemia T2DM Carotid bruit Aortic valve disease Aortic valve stenosis Hypertension Anemia of chronic disease   Significant Hospital Events   2022-07-24:   Consults:  Nephrology Cardiology   Procedures:  2022-07-24: Right IJ central line   Significant Diagnostic Tests:  2022-07-24: Chest Xray>Mild, diffusely increased interstitial lung markings, with a mild superimposed component of interstitial edema and pulmonary vascular congestion   Micro Data:  07/23/22: SARS-CoV-2 PCR> negative 07/23/22: Influenza PCR> negative 07-24-22: Blood culture x2> 07-24-22: Urine Culture> Jul 24, 2022: MRSA PCR>>  07-24-22: Strep pneumo urinary antigen> 24-Jul-2022: Legionella urinary antigen>  NSTEMI Cardiogenic +/- Septic shock in the setting of acute Decompensated CHF and moderate to severe Aortic Stenosis EKG shows  diffuse ST depression with elevated troponin at 7000~7931~8877~10855 -Serial EKG -Trend troponin -ASA 81mg  PO daily  (initial 162-324mg  = 2-4x 81mg  chewable tablets) -Anti-coagulation (Heparin drip per ACS protocol) -Start high-dose statin Atorvastatin 80mg  PO daily, check lipid panel  -Hold the blocker for hypertension/ cardiogenic shock)  -Hold nitroglycerin for hypotensive / cardiogenic shock  -Continue vasopressors for MAP> 65 -Cardiology Consult.  Above finding discussed with on-call cardiologist Dr. Stanford Breed as well as STEMI doctor on call, Dr. Corky Sox. Patient evaluated at the bedside by STEMI doctor on call who recommends no urgent intervention with probable cath in the morning.   Acute Hypoxic Respiratory Failure secondary to pulmonary Edema -Supplemental O2 as needed to maintain O2 saturations 88 to 92% -BiPAP, wean as tolerated -Follow intermittent ABG and chest x-ray as needed -Repeat CXR with a diffusely increased interstitial lung markings with interstitial edema and pulmonary vascular congestion -IV Lasix as blood pressure and renal function permits; currently on Lasix 20 mg IV BID -As needed bronchodilators   Acute HFrEF (Approximate EF of 35 to 40% per bedside Echo by Cardiology -Hypertension Hx: CAD, HLD  -Elevated BNP 2968 -Continuous cardiac monitoring -Maintain MAP greater than 65 -IV Lasix as blood pressure and renal function permits; currently on Lasix 40 mg IV BID -Hold Norvasc, losartan, telmisartan, clonidine and carvedilol -Cardiology following, appreciate input -Repeat 2D Echocardiogram   Leukocytosis likely reactive in the setting of NSTEMI No obvious source of infection, chest x-ray and UA negative -F/u cultures, trend lactic/ PCT -Monitor WBC/ fever curve -Hold antibiotics for now pending cultures -Pressors for MAP goal >65 -Strict I/O's   ESRD on HD Last HD on 2/19 Mild AGMA with Lactic Acidosis -Monitor I&O's / urinary output -Follow BMP -Ensure adequate renal perfusion -Avoid nephrotoxic agents as able -Replace electrolytes as indicated -Nephrology  consult   T2 Diabetes mellitus -CBGs -Sliding scale insulin -Follow ICU hyper/hypoglycemia protocol -Hold home Meds   Anemia of Chronic Disease Hgb 9.0 drop from 12.5 4 months ago -Follow H&H -IV iron and EPO per nephrologist -Check iron panel and ferritin levels -Monitor for signs and symptoms of bleeding while on heparin      PATIENT UNDERWENT CARDIAC CATH WHICH SHOWED SEVERE 2VD with SEVERE CARDIOGENIC SHOCK  20 MINS POST CATH, PATIENT UNDER WENT PEA/ASYSTOLE CPR LASTED FOR 7 MINS\   GOALS OF CARE DISCUSSION  The Clinical status was relayed to family in detail- Son at bedside in Cardiac Cath lab.   Updated and notified of patients medical condition- Patient remains unresponsive and will not open eyes to command.   Patient is having a weak cough and struggling to remove secretions.   Patient with increased WOB and using accessory muscles to breathe Explained to family course of therapy and the modalities    Patient is suffering and dying process  Patient with Progressive multiorgan failure with a very high probablity of a very minimal chance of meaningful recovery despite all aggressive and optimal medical therapy.  PATIENT REMAINS FULL CODE  Family understands the situation.  Family consented and agreed to DNR/DNI and would like to proceed with Comfort care measures.  Patient died at 2:53PM   Pertinent Labs and Studies  Significant Diagnostic Studies DG Chest 1 View  Result Date: Jul 13, 2021 CLINICAL DATA:  Central line placement EXAM: CHEST  1 VIEW COMPARISON:  13-Jul-2021 12:23 a.m. FINDINGS: Interval placement of a right IJ CVC with tip approximating the superior cavoatrial junction. Redemonstrated increased prominence of the perihilar pulmonary vasculature and diffusely increased interstitial markings.  Elevation of the right hemidiaphragm. Normal cardiac and mediastinal contours. Aortic atherosclerosis. Left upper arm vascular stent. Status post left shoulder  arthroplasty. No definite pleural effusion or pneumothorax. IMPRESSION: 1. Right IJ CVC with tip approximating the superior cavoatrial junction. 2. Redemonstrated findings concerning for pulmonary edema. Electronically Signed   By: Merilyn Baba M.D.   On: Jul 24, 2021 03:27   DG Chest 2 View  Result Date: 06/18/2021 CLINICAL DATA:  Shortness of breath for several days, initial encounter EXAM: CHEST - 2 VIEW COMPARISON:  07/19/2019 FINDINGS: Cardiac shadow is stable. Aortic calcifications are noted. Lungs are well aerated bilaterally. Minimal left basilar atelectasis is seen. Small left pleural effusion is noted. No bony abnormality is noted. Postsurgical changes of the left arm are noted. IMPRESSION: Mild left basilar atelectasis and effusion. Electronically Signed   By: Inez Catalina M.D.   On: 06/16/2021 22:21   PERIPHERAL VASCULAR CATHETERIZATION  Result Date: 06/20/2021 See surgical note for result.  DG Chest Portable 1 View  Result Date: 2021/07/24 CLINICAL DATA:  Worsening shortness of breath. EXAM: PORTABLE CHEST 1 VIEW COMPARISON:  None. FINDINGS: Mild, diffusely increased interstitial lung markings are seen. This is very mildly increased in severity when compared to the prior study. Mild prominence of the perihilar pulmonary vasculature is also noted. There is stable elevation of the right hemidiaphragm, without evidence of focal consolidation, pleural effusion or pneumothorax. The heart size and mediastinal contours are within normal limits. Marked severity calcification of the thoracic aorta is seen. A radiopaque vascular stent is seen within the medial aspect of the left upper extremity. There is an intact left shoulder replacement. IMPRESSION: Mild, diffusely increased interstitial lung markings, with a mild superimposed component of interstitial edema and pulmonary vascular congestion. Electronically Signed   By: Virgina Norfolk M.D.   On: 07/24/2021 00:26   VAS US DUPLEX DIALYSIS ACCESS  (AVF, AVG)  Result Date: 06/11/2021 DIALYSIS ACCESS Patient Name:  XYLON CROOM  Date of Exam:   06/06/2021 Medical Rec #: [id number]        Accession #:    [id number] Date of Birth: 01-24-1932         Patient Gender: M Patient Age:   88 years Exam Location:  [facility] Procedure:      VAS US DUPLEX DIALYSIS ACCESS (AVF, AVG) Referring Phys: Eulogio Ditch --------------------------------------------------------------------------------  Reason for Exam: Clotting during dialysis. Access Site: Left Upper Extremity. Access Type: BrachAx. History: 01/05/19: Left Brach-Ax AVG placement;          07/27/19: PTA/stent of AVG venous outflow;. Comparison Study: 08/30/2020 Performing Technologist: Concha Norway RVT  Examination Guidelines: A complete evaluation includes B-mode imaging, spectral Doppler, color Doppler, and power Doppler as needed of all accessible portions of each vessel. Unilateral testing is considered an integral part of a complete examination. Limited examinations for reoccurring indications may be performed as noted.  Findings:   +--------------------+----------+-----------------+----------------------------+  AVG                  PSV (cm/s) Flow Vol (mL/min)           Describe            +--------------------+----------+-----------------+----------------------------+  Native artery inflow    232           1443                                      +--------------------+----------+-----------------+----------------------------+  Arterial anastomosis    767                       change in diameter, narrows                                                         to .13cm and stenotic      +--------------------+----------+-----------------+----------------------------+  Prox graft              688                            change in diameter       +--------------------+----------+-----------------+----------------------------+  Mid graft               533                                                      +--------------------+----------+-----------------+----------------------------+  Distal graft            342                                                     +--------------------+----------+-----------------+----------------------------+  Venous anastomosis      233                                                     +--------------------+----------+-----------------+----------------------------+  Venous outflow          126                                  stent              +--------------------+----------+-----------------+----------------------------+ +---------------+------------+----------+---------+--------+------------------+                    Diameter   Depth (cm) Branching   PSV       Flow Volume                           (cm)                           (cm/s)       (ml/min)       +---------------+------------+----------+---------+--------+------------------+  Left Rad Art                                         48                         Dis                                                                            +---------------+------------+----------+---------+--------+------------------+  Antegrade                                                                      +---------------+------------+----------+---------+--------+------------------+  Summary: Patent left BrachAx AVG with severe narrowing of the anastomosis site and proximal AVG to .13cm diameter with associated high velocities suggestive of severe stenosis.  *See table(s) above for measurements and observations.  Diagnosing physician: Hortencia Pilar MD Electronically signed by Hortencia Pilar MD on 06/11/2021 at 1:03:08 PM.   --------------------------------------------------------------------------------   Final    ECHOCARDIOGRAM COMPLETE  Result Date: 07-14-21    ECHOCARDIOGRAM REPORT   Patient Name:   MASUD HOLUB Date of Exam: 14-Jul-2021 Medical Rec #:  [id number]       Height:       68.0 in Accession #:     [id number]      Weight:       138.4 lb Date of Birth:  04/27/1932        BSA:          1.748 m Patient Age:    27 years        BP:           115/60 mmHg Patient Gender: M               HR:           89 bpm. Exam Location:  [facility] Procedure: 2D Echo, Cardiac Doppler, Color Doppler and Intracardiac            Opacification Agent Indications:     I21.4 NSTEMI  History:         Patient has prior history of Echocardiogram examinations, most                  recent 05/09/2021. Signs/Symptoms:Murmur; Risk Factors:Diabetes                  and Hypertension. Chronic kidney disease.  Sonographer:     Cresenciano Lick RDCS Referring Phys:  [employer] Phys: Ida Rogue MD IMPRESSIONS  1. Left ventricular ejection fraction, by estimation, is 25 %  2. The left ventricle has severely decreased function. The left ventricle demonstrates global hypokinesis. There is mild left ventricular hypertrophy. Left ventricular diastolic parameters are consistent with Grade II diastolic dysfunction (pseudonormalization).  3. Right ventricular systolic function is normal. The right ventricular size is normal. There is moderately elevated pulmonary artery systolic pressure. The estimated right ventricular systolic pressure is 06.3 mmHg.  4. Left atrial size was moderately dilated.  5. The mitral valve is normal in structure. Moderate to severe mitral valve regurgitation. No evidence of mitral stenosis.  6. Tricuspid valve regurgitation is mild to moderate.  7. The aortic valve is normal in structure. There is severe calcifcation of the aortic valve. Aortic valve regurgitation is mild to moderate. Severe aortic valve stenosis. Aortic valve area, by VTI measures 0.62 cm. Aortic valve mean gradient measures 39.8 mmHg. Aortic valve Vmax measures 3.87 m/s.  8. The inferior vena cava is normal in size with <50% respiratory variability, suggesting right atrial pressure of 8 mmHg. FINDINGS  Left Ventricle: Left ventricular  ejection fraction, by estimation, is 25 %. The left ventricle has severely decreased function. The left  ventricle demonstrates global hypokinesis. Definity contrast agent was given IV to delineate the left ventricular endocardial borders. The left ventricular internal cavity size was normal in size. There is mild left ventricular hypertrophy. Left ventricular diastolic parameters are consistent with Grade II diastolic dysfunction (pseudonormalization). Right Ventricle: The right ventricular size is normal. No increase in right ventricular wall thickness. Right ventricular systolic function is normal. There is moderately elevated pulmonary artery systolic pressure. The tricuspid regurgitant velocity is 3.00 m/s, and with an assumed right atrial pressure of 10 mmHg, the estimated right ventricular systolic pressure is 93.8 mmHg. Left Atrium: Left atrial size was moderately dilated. Right Atrium: Right atrial size was normal in size. Pericardium: There is no evidence of pericardial effusion. Mitral Valve: The mitral valve is normal in structure. Moderate to severe mitral valve regurgitation. No evidence of mitral valve stenosis. Tricuspid Valve: The tricuspid valve is normal in structure. Tricuspid valve regurgitation is mild to moderate. No evidence of tricuspid stenosis. Aortic Valve: The aortic valve is normal in structure. There is severe calcifcation of the aortic valve. Aortic valve regurgitation is mild to moderate. Aortic regurgitation PHT measures 175 msec. Severe aortic stenosis is present. Aortic valve mean gradient measures 39.8 mmHg. Aortic valve peak gradient measures 59.9 mmHg. Aortic valve area, by VTI measures 0.62 cm. Pulmonic Valve: The pulmonic valve was normal in structure. Pulmonic valve regurgitation is not visualized. No evidence of pulmonic stenosis. Aorta: The aortic root is normal in size and structure. Venous: The inferior vena cava is normal in size with less than 50% respiratory  variability, suggesting right atrial pressure of 8 mmHg. IAS/Shunts: No atrial level shunt detected by color flow Doppler.  LEFT VENTRICLE PLAX 2D LVIDd:         5.00 cm   Diastology LVIDs:         4.10 cm   LV e' medial:    5.00 cm/s LV PW:         1.00 cm   LV E/e' medial:  31.6 LV IVS:        1.00 cm   LV e' lateral:   6.53 cm/s LVOT diam:     1.90 cm   LV E/e' lateral: 24.2 LV SV:         46 LV SV Index:   27 LVOT Area:     2.84 cm  RIGHT VENTRICLE RV Basal diam:  4.70 cm RV S prime:     10.30 cm/s TAPSE (M-mode): 1.1 cm LEFT ATRIUM             Index        RIGHT ATRIUM           Index LA diam:        4.50 cm 2.57 cm/m   RA Area:     10.80 cm LA Vol (A2C):   70.0 ml 40.05 ml/m  RA Volume:   27.40 ml  15.68 ml/m LA Vol (A4C):   71.8 ml 41.08 ml/m LA Biplane Vol: 70.7 ml 40.45 ml/m  AORTIC VALVE AV Area (Vmax):    0.62 cm AV Area (Vmean):   0.54 cm AV Area (VTI):     0.62 cm AV Vmax:           387.00 cm/s AV Vmean:          302.000 cm/s AV VTI:            0.752 m AV Peak Grad:      59.9 mmHg AV  Mean Grad:      39.8 mmHg LVOT Vmax:         84.40 cm/s LVOT Vmean:        57.300 cm/s LVOT VTI:          0.164 m LVOT/AV VTI ratio: 0.22 AI PHT:            175 msec  AORTA Ao Root diam: 3.00 cm Ao Asc diam:  3.00 cm MITRAL VALVE                TRICUSPID VALVE MV Area (PHT): 5.38 cm     TR Peak grad:   36.0 mmHg MV Decel Time: 141 msec     TR Vmax:        300.00 cm/s MR Peak grad: 122.8 mmHg MR Mean grad: 81.0 mmHg     SHUNTS MR Vmax:      554.00 cm/s   Systemic VTI:  0.16 m MR Vmean:     421.0 cm/s    Systemic Diam: 1.90 cm MV E velocity: 158.00 cm/s MV A velocity: 54.60 cm/s MV E/A ratio:  2.89 Ida Rogue MD Electronically signed by Ida Rogue MD Signature Date/Time: 2021-07-09/1:02:14 PM    Final (Updated)     Microbiology Recent Results (from the past 240 hour(s))  Resp Panel by RT-PCR (Flu A&B, Covid) Nasopharyngeal Swab     Status: None   Collection Time: 06/19/2021 11:24 PM   Specimen:  Nasopharyngeal Swab; Nasopharyngeal(NP) swabs in vial transport medium  Result Value Ref Range Status   SARS Coronavirus 2 by RT PCR NEGATIVE NEGATIVE Final    Comment: (NOTE) SARS-CoV-2 target nucleic acids are NOT DETECTED.  The SARS-CoV-2 RNA is generally detectable in upper respiratory specimens during the acute phase of infection. The lowest concentration of SARS-CoV-2 viral copies this assay can detect is 138 copies/mL. A negative result does not preclude SARS-Cov-2 infection and should not be used as the sole basis for treatment or other patient management decisions. A negative result may occur with  improper specimen collection/handling, submission of specimen other than nasopharyngeal swab, presence of viral mutation(s) within the areas targeted by this assay, and inadequate number of viral copies(<138 copies/mL). A negative result must be combined with clinical observations, patient history, and epidemiological information. The expected result is Negative.  Fact Sheet for Patients:  EntrepreneurPulse.com.au  Fact Sheet for Healthcare Providers:  IncredibleEmployment.be  This test is no t yet approved or cleared by the [place] FDA and  has been authorized for detection and/or diagnosis of SARS-CoV-2 by FDA under an Emergency Use Authorization (EUA). This EUA will remain  in effect (meaning this test can be used) for the duration of the COVID-19 declaration under Section 564(b)(1) of the Act, 21 U.S.C.section 360bbb-3(b)(1), unless the authorization is terminated  or revoked sooner.       Influenza A by PCR NEGATIVE NEGATIVE Final   Influenza B by PCR NEGATIVE NEGATIVE Final    Comment: (NOTE) The Xpert Xpress SARS-CoV-2/FLU/RSV plus assay is intended as an aid in the diagnosis of influenza from Nasopharyngeal swab specimens and should not be used as a sole basis for treatment. Nasal washings and aspirates are unacceptable for  Xpert Xpress SARS-CoV-2/FLU/RSV testing.  Fact Sheet for Patients: EntrepreneurPulse.com.au  Fact Sheet for Healthcare Providers: IncredibleEmployment.be  This test is not yet approved or cleared by the [place] FDA and has been authorized for detection and/or diagnosis of SARS-CoV-2 by FDA under an Emergency Use Authorization (EUA). This EUA  will remain in effect (meaning this test can be used) for the duration of the COVID-19 declaration under Section 564(b)(1) of the Act, 21 U.S.C. section 360bbb-3(b)(1), unless the authorization is terminated or revoked.  Performed at [facility], [address] [place], [place] [address]   Culture, blood (Routine X 2) w Reflex to ID Panel     Status: None (Preliminary result)   Collection Time: 07/09/2021 11:52 PM   Specimen: BLOOD  Result Value Ref Range Status   Specimen Description BLOOD RIGHT ASSIST CONTROL  Final   Special Requests   Final    BOTTLES DRAWN AEROBIC AND ANAEROBIC Blood Culture results may not be optimal due to an excessive volume of blood received in culture bottles   Culture   Final    NO GROWTH < 12 HOURS Performed at [facility], [address] [place], [place] [address]    Report Status PENDING  Incomplete  MRSA Next Gen by PCR, Nasal     Status: None   Collection Time: 07/16/2021  3:52 AM   Specimen: Nasal Mucosa; Nasal Swab  Result Value Ref Range Status   MRSA by PCR Next Gen NOT DETECTED NOT DETECTED Final    Comment: (NOTE) The GeneXpert MRSA Assay (FDA approved for NASAL specimens only), is one component of a comprehensive MRSA colonization surveillance program. It is not intended to diagnose MRSA infection nor to guide or monitor treatment for MRSA infections. Test performance is not FDA approved in patients less than 77 years old. Performed at [facility], [address] [place], [place] [address]     Lab Basic Metabolic  Panel: Recent Labs  Lab 06/26/2021 2208 July 16, 2021 0427  NA 136 136  K 4.8 4.6  CL 92* 97*  CO2 27 23  GLUCOSE 194* 199*  BUN 50* 53*  CREATININE 6.82* 6.95*  CALCIUM 9.3 8.9  MG  --  2.0  PHOS  --  4.4   Liver Function Tests: No results for input(s): AST, ALT, ALKPHOS, BILITOT, PROT, ALBUMIN in the last 168 hours. No results for input(s): LIPASE, AMYLASE in the last 168 hours. No results for input(s): AMMONIA in the last 168 hours. CBC: Recent Labs  Lab 06/14/2021 2208 Jul 16, 2021 0427  WBC 16.2* 19.6*  HGB 9.0* 8.7*  HCT 27.9* 26.2*  MCV 103.3* 102.3*  PLT 151 179   Cardiac Enzymes: No results for input(s): CKTOTAL, CKMB, CKMBINDEX, TROPONINI in the last 168 hours. Sepsis Labs: Recent Labs  Lab 07/03/2021 2208 16-Jul-2021 0101 July 16, 2021 0427  PROCALCITON 0.62 0.63  --   WBC 16.2*  --  19.6*  LATICACIDVEN  --  3.6* 1.6      Najeeb [place]ain 07-16-21, 2:57 PM

## 2021-07-11 NOTE — Progress Notes (Signed)
ABG not drawn at time of Bipap placement. Unaware of order, cardiologist at bedside and central line placement by NP taking place at this time.

## 2021-07-11 NOTE — ED Notes (Signed)
Intensivist at bedside with [facility] placing [employer]

## 2021-07-11 NOTE — Progress Notes (Signed)
Cardiology Progress Note   Patient Name: Dylan Sanders Date of Encounter: 2021-07-20  Primary Cardiologist: Ida Rogue, MD  Subjective   Remains on bipap and norepi.  Currently feels well w/o chest burning or dyspnea.  Son @ bedside.  Pt agreeable to cath today.  Inpatient Medications    Scheduled Meds:  aspirin EC  81 mg Oral Daily   atorvastatin  80 mg Oral Daily   Chlorhexidine Gluconate Cloth  6 each Topical Daily   Chlorhexidine Gluconate Cloth  6 each Topical Q0600   furosemide  20 mg Intravenous BID   sodium chloride flush  3 mL Intravenous Q12H   Continuous Infusions:  sodium chloride Stopped (07/20/2021 0349)   heparin 800 Units/hr (07/20/2021 0800)   norepinephrine (LEVOPHED) Adult infusion 14 mcg/min (Jul 20, 2021 0800)   PRN Meds: docusate sodium, polyethylene glycol   Vital Signs    Vitals:   20-Jul-2021 0815 2021-07-20 0830 2021-07-20 0833 20-Jul-2021 0845  BP: (!) 122/58 (!) 113/49  (!) 109/37  Pulse:  80    Resp: 16 16  (!) 22  Temp:      TempSrc:      SpO2:  94% 95%   Weight:      Height:        Intake/Output Summary (Last 24 hours) at 2021-07-20 0855 Last data filed at July 20, 2021 0800 Gross per 24 hour  Intake 1315.13 ml  Output --  Net 1315.13 ml   Filed Weights   07/08/2021 2202 07-20-21 0349  Weight: 61.7 kg 62.8 kg    Physical Exam   GEN: thin, frail, in no acute distress.  HEENT: Grossly normal.  Neck: Supple, no JVD, carotid bruits, or masses. Cardiac: RRR, 2/6 SEM throughout, no rubs or gallops. No clubbing, cyanosis, edema.  Radials 2+, DP/PT 1+ bilaterally.  Respiratory:  Respirations regular and unlabored, coarse breath sounds bilaterally - on bipap. GI: Soft, nontender, nondistended, BS + x 4. MS: no deformity or atrophy. Skin: warm and dry, no rash. Neuro:  Strength and sensation are intact. Psych: AAOx3.  Normal affect.  Labs    Chemistry Recent Labs  Lab 07/02/2021 2208 20-Jul-2021 0427  NA 136 136  K 4.8 4.6  CL 92* 97*  CO2  27 23  GLUCOSE 194* 199*  BUN 50* 53*  CREATININE 6.82* 6.95*  CALCIUM 9.3 8.9  GFRNONAA 7* 7*  ANIONGAP 17* 16*     Hematology Recent Labs  Lab 06/17/2021 2208 07/20/2021 0427  WBC 16.2* 19.6*  RBC 2.70* 2.56*  HGB 9.0* 8.7*  HCT 27.9* 26.2*  MCV 103.3* 102.3*  MCH 33.3 34.0  MCHC 32.3 33.2  RDW 15.2 15.3  PLT 151 179    Cardiac Enzymes  Recent Labs  Lab 06/21/2021 2208 07/03/2021 2352 20-Jul-2021 0101 07-20-2021 0427  TROPONINIHS 7,002* 7,931* 8,877* 10,855*      BNP Recent Labs  Lab 2021-07-20 0003  BNP 2,968.3*     DDimer  Recent Labs  Lab Jul 20, 2021 0101  DDIMER 1.25*    Radiology    DG Chest 1 View  Result Date: 07-20-21 CLINICAL DATA:  Central line placement EXAM: CHEST  1 VIEW COMPARISON:  Jul 20, 2021 12:23 a.m. FINDINGS: Interval placement of a right IJ CVC with tip approximating the superior cavoatrial junction. Redemonstrated increased prominence of the perihilar pulmonary vasculature and diffusely increased interstitial markings. Elevation of the right hemidiaphragm. Normal cardiac and mediastinal contours. Aortic atherosclerosis. Left upper arm vascular stent. Status post left shoulder arthroplasty. No definite pleural effusion or pneumothorax.  IMPRESSION: 1. Right IJ CVC with tip approximating the superior cavoatrial junction. 2. Redemonstrated findings concerning for pulmonary edema. Electronically Signed   By: Merilyn Baba M.D.   On: 2021/07/09 03:27   DG Chest 2 View  Result Date: 06/25/2021 CLINICAL DATA:  Shortness of breath for several days, initial encounter EXAM: CHEST - 2 VIEW COMPARISON:  07/19/2019 FINDINGS: Cardiac shadow is stable. Aortic calcifications are noted. Lungs are well aerated bilaterally. Minimal left basilar atelectasis is seen. Small left pleural effusion is noted. No bony abnormality is noted. Postsurgical changes of the left arm are noted. IMPRESSION: Mild left basilar atelectasis and effusion. Electronically Signed   By: Inez Catalina  M.D.   On: 07/03/2021 22:21   DG Chest Portable 1 View  Result Date: 2021/07/09 CLINICAL DATA:  Worsening shortness of breath. EXAM: PORTABLE CHEST 1 VIEW COMPARISON:  None. FINDINGS: Mild, diffusely increased interstitial lung markings are seen. This is very mildly increased in severity when compared to the prior study. Mild prominence of the perihilar pulmonary vasculature is also noted. There is stable elevation of the right hemidiaphragm, without evidence of focal consolidation, pleural effusion or pneumothorax. The heart size and mediastinal contours are within normal limits. Marked severity calcification of the thoracic aorta is seen. A radiopaque vascular stent is seen within the medial aspect of the left upper extremity. There is an intact left shoulder replacement. IMPRESSION: Mild, diffusely increased interstitial lung markings, with a mild superimposed component of interstitial edema and pulmonary vascular congestion. Electronically Signed   By: Virgina Norfolk M.D.   On: Jul 09, 2021 00:26    Telemetry    RSR - Personally Reviewed  ECG    RSR, 81, inferolateral ST depression, septal infarct - Personally Reviewed  Cardiac Studies   2D Echocardiogram 12.2022   1. Left ventricular ejection fraction, by estimation, is 55 to 60%. The  left ventricle has normal function. The left ventricle has no regional  wall motion abnormalities. There is mild left ventricular hypertrophy.  Left ventricular diastolic parameters  are consistent with Grade II diastolic dysfunction (pseudonormalization).   2. Right ventricular systolic function is normal. The right ventricular  size is normal.   3. The mitral valve is degenerative. No evidence of mitral valve  regurgitation. Mild mitral stenosis. The mean mitral valve gradient is 3.2  mmHg. Moderate mitral annular calcification.   4. Aortic valve mean gradient 34mmHg, peak gradient 16mmHg, DVI=0.25,  Vmax 3.60m/s, AVA 0.88cm2. The aortic valve is  tricuspid. Aortic valve  regurgitation is mild. Moderate to severe aortic valve stenosis.  _____________   Patient Profile     88 y.o. male w/ a h/o HTN, DMII, ESRD on HD, mod-sev AS, RAS, HL, colon cancer, MGUS, pulm nodules, and Ao atherosclerosis, who presented late 07/08/2021 w/ a 4 day h/o progressive rest and exertional chest burning, and was found to have new septal Q waves, inflat ST depression, and NSTEMI w/ HsTrop 10855, and hypotension req norepi.  Assessment & Plan    1.  NSTEMI:  Pt w/ a h/o cor Ca2+ and PVD/RAS, but w/o known h/o obstructive CAD.  He's been having rest and exertional sscp/burning w/ dyspnea x 5 days.  Presented to the ED late on 2/22 and noted to have new septal infarct w/ inflat ST depression.  Hypotension also noted req norepi.  HsTrop has risen to 10855.  Currently c/p free on heparin/bipap.  Received ASA in ED.  Will add ASA 81 daily and high potency statin.  Plan for  diagnostic cath today, to be followed by HD.  The patient understands that risks include but are not limited to stroke (1 in 1000), death (1 in 28), kidney failure [usually temporary] (1 in 500), bleeding (1 in 200), allergic reaction [possibly serious] (1 in 200), and agrees to proceed.  Unable to add ? blocker in setting of shock/hypotension.  2.  Moderate-Severe Aortic Stenosis:  Progressive over time and mod-sev AS by echo in 04/2021.  Repeat echo pending in the setting of above.  3.  ESRD:  Nephrology following.  Plan for HD post-cath today.  4.  Hypotension/shock:  Norepi pre CCM.  5.  Acute resp failure:  currently saturating in the low 90's on bipap.  6.  Macrocytic anemia:  H/H lower compared to October readings.  Followed in outpt setting by heme.  Follow in setting of anticoagulation w/ low threshold to transfuse in setting of ischemia/NSTEMI.  Signed, Murray Hodgkins, NP  Jul 29, 2021, 8:55 AM    For questions or updates, please contact   Please consult [email or web address] for contact  info under Cardiology/STEMI.

## 2021-07-11 NOTE — Consult Note (Signed)
PHARMACY CONSULT NOTE  Pharmacy Consult for Electrolyte Monitoring and Replacement   Recent Labs: Potassium (mmol/L)  Date Value  July 09, 2021 4.6  06/19/2011 4.1   Magnesium (mg/dL)  Date Value  09-Jul-2021 2.0   Calcium (mg/dL)  Date Value  07/09/21 8.9   Calcium, Total (mg/dL)  Date Value  06/19/2011 8.9   Albumin (g/dL)  Date Value  02/25/2020 4.1  06/19/2011 3.8   Phosphorus (mg/dL)  Date Value  July 09, 2021 4.4   Sodium (mmol/L)  Date Value  2021-07-09 136  06/19/2011 141   Assessment: Patient is an 88 y/o M with medical history including renal artery stenosis s/p stenting in 06/2018, CKD stage V on HD, HLD, aortic valve stenosis, HTN, anemia of chronic disease who is admitted with NSTEMI / pulmonary edema / cardiogenic shock / acute decompensated heart failure. Pharmacy consulted to assist with electrolyte monitoring and replacement as indicated.  Goal of Therapy:  Electrolytes within normal limits  Plan:  --No electrolyte replacement indicated at this time --Follow-up electrolytes with AM labs tomorrow  Benita Gutter 09-Jul-2021 9:21 AM

## 2021-07-11 NOTE — ED Notes (Signed)
CVC placement confirmed through xray by intensivalist. States it is good to use at this time

## 2021-07-11 NOTE — Progress Notes (Signed)
°   07-30-21 1400  Clinical Encounter Type  Visited With Patient and family together  Visit Type Initial  Referral From Physician  Consult/Referral To Chaplain   Chaplain responded to code in cath lab. Patient is EOL. Chaplain visited with son Juanda Crumble in ICU waiting. Chaplain provided emotional/spiritual support and reflective listening. Son appreciated chaplain visit.

## 2021-07-11 NOTE — Progress Notes (Signed)
ANTICOAGULATION CONSULT NOTE  Pharmacy Consult for heparin infusion Indication: ACS/STEMI  Patient Measurements: Height: 5\' 8"  (172.7 cm) Weight: 62.8 kg (138 lb 7.2 oz) IBW/kg (Calculated) : 68.4 Heparin Dosing Weight: 61.7 kg  Labs: Recent Labs    07/03/2021 2208 06/25/2021 2352 21-Jul-2021 0101 Jul 21, 2021 0427 2021/07/21 0743  HGB 9.0*  --   --  8.7*  --   HCT 27.9*  --   --  26.2*  --   PLT 151  --   --  179  --   APTT 35  --   --   --   --   LABPROT 15.1  --   --   --   --   INR 1.2  --   --   --   --   HEPARINUNFRC  --   --   --   --  0.30  CREATININE 6.82*  --   --  6.95*  --   TROPONINIHS 7,002* 7,931* 9,381* 10,855*  --      Estimated Creatinine Clearance: 6.7 mL/min (A) (by C-G formula based on SCr of 6.95 mg/dL (H)).   Medical History: Past Medical History:  Diagnosis Date   Anemia    Cancer ([place]) 2008   colon   Chronic kidney disease    Diabetes mellitus without complication (HCC)    Heart murmur    asymptomatic   Hypertension    Assessment: Pt is 88 yo male w/ ESRD on HD, presenting to ED c/o SOB, found with elevated Troponin I, trending up.  Goal of Therapy:  Heparin level 0.3-0.7 units/ml Monitor platelets by anticoagulation protocol: Yes   Plan:  --Heparin level therapeutic x 1 (at LL goal range) --Continue heparin infusion at 800 units/hr --Re-check HL in 8 hrs  --CBC daily while on heparin --Patient is pending cardiac catheterization 2/23  Benita Gutter  07/21/21 9:19 AM

## 2021-07-11 NOTE — Consult Note (Signed)
[facility] Cardiology  CARDIOLOGY CONSULT NOTE  Patient ID: Dylan Sanders MRN: [id number] DOB/AGE: 1931/11/09 88 y.o.  Admit date: 07/09/2021 Referring Physician Dr Mortimer Fries Primary Physician Sparks, Leonie Douglas, MD Primary Cardiologist Esmond Plants Reason for Consultation NSTEMI, cardiogenic shock  HPI:  Dylan Sanders is an 88 year old male with a history of moderate to severe aortic stenosis, end-stage renal disease, type 2 diabetes, renal artery stenosis, hyperlipidemia who presents with shortness of breath and chest tightness.  He was discovered to have an elevated troponin of 7000 and hypotension consistent with cardiogenic shock.  I was called for consideration of taking to the patient to the Cath Lab emergently overnight.  At the time of my evaluation the patient is on BiPAP, and appears comfortable.  He states that he does not have chest pain but does have tightness in his chest.  He says that beginning on Sunday he went out to dinner and following this had an episode of chest tightness that persisted for several hours.  He was unable to go to dialysis on Tuesday and tolerated this well but had some chest tightness afterwards.  At around 2 PM on Wednesday the chest tightness again returned and persisted throughout the evening prompting his presentation to the emergency department around 10 PM.    Review of systems complete and found to be negative unless listed above     Past Medical History:  Diagnosis Date   Anemia    Cancer ([place]) 2008   colon   Chronic kidney disease    Diabetes mellitus without complication ([place])    Heart murmur    asymptomatic   Hypertension     Past Surgical History:  Procedure Laterality Date   A/V SHUNT INTERVENTION Left 07/27/2019   Procedure: A/V SHUNT INTERVENTION;  Surgeon: Katha Cabal, MD;  Location: [place] [facility];  Service: Cardiovascular;  Laterality: Left;   A/V SHUNTOGRAM N/A 06/20/2021   Procedure: A/V SHUNTOGRAM;  Surgeon: Algernon Huxley,  MD;  Location: [place] [facility];  Service: Cardiovascular;  Laterality: N/A;   AV FISTULA PLACEMENT Left 01/08/2019   Procedure: INSERTION OF ARTERIOVENOUS (AV) GORE-TEX GRAFT ARM ( BRACHIAL AXILLARY );  Surgeon: Katha Cabal, MD;  Location: [facility] OR[facility];  Service: Vascular;  Laterality: Left;   COLON SURGERY     EYE SURGERY     bilateral   RENAL ANGIOGRAPHY Bilateral 11/19/2016   Procedure: Renal Angiography;  Surgeon: Katha Cabal, MD;  Location: [place] [facility];  Service: Cardiovascular;  Laterality: Bilateral;   RENAL ANGIOGRAPHY Right 07/03/2018   Procedure: RENAL ANGIOGRAPHY;  Surgeon: Katha Cabal, MD;  Location: [place] [facility];  Service: Cardiovascular;  Laterality: Right;   REVERSE SHOULDER ARTHROPLASTY Left 09/19/2016   Procedure: REVERSE SHOULDER ARTHROPLASTY;  Surgeon: Corky Mull, MD;  Location: [facility];  Service: Orthopedics;  Laterality: Left;   ROTATOR CUFF REPAIR Right     (Not in a hospital admission)  Social History   Socioeconomic History   Marital status: Widowed    Spouse name: Not on file   Number of children: Not on file   Years of education: Not on file   Highest education level: Not on file  Occupational History   Not on file  Tobacco Use   Smoking status: Former    Years: 25.00    Types: Pipe, Cigarettes, Cigars    Quit date: 09/05/1976    Years since quitting: 44.8   Smokeless tobacco: Never  Vaping Use  Vaping Use: Never used  Substance and Sexual Activity   Alcohol use: Yes    Comment: ocassionally   Drug use: No   Sexual activity: Never  Other Topics Concern   Not on file  Social History Narrative   Not on file   Social Determinants of Health   Financial Resource Strain: Not on file  Food Insecurity: Not on file  Transportation Needs: Not on file  Physical Activity: Not on file  Stress: Not on file  Social Connections: Not on file  Intimate Partner Violence: Not on file    Family History  Problem  Relation Age of Onset   Cancer Father       Review of systems complete and found to be negative unless listed above    PHYSICAL EXAM  General: Not in distress while on BiPAP HEENT:  Normocephalic and atramatic Neck:  No JVD.  Lungs: Clear bilaterally to auscultation and percussion. Heart: HRRR . 3/6 systolic murmur, slightly decreased s2 Abdomen: Bowel sounds are positive, abdomen soft and non-tender  Msk:  Back normal, normal gait. Normal strength and tone for age. Extremities: No clubbing, cyanosis or edema.   Neuro: Alert and oriented X 3. Psych:  Good affect, responds appropriately  Labs:   Lab Results  Component Value Date   WBC 16.2 (H) 06/22/2021   HGB 9.0 (L) 07/01/2021   HCT 27.9 (L) 06/13/2021   MCV 103.3 (H) 07/07/2021   PLT 151 06/21/2021    Recent Labs  Lab 06/16/2021 2208  NA 136  K 4.8  CL 92*  CO2 27  BUN 50*  CREATININE 6.82*  CALCIUM 9.3  GLUCOSE 194*   No results found for: CKTOTAL, CKMB, CKMBINDEX, TROPONINI No results found for: CHOL No results found for: HDL No results found for: LDLCALC No results found for: TRIG No results found for: CHOLHDL No results found for: LDLDIRECT    Radiology: DG Chest 2 View  Result Date: 06/25/2021 CLINICAL DATA:  Shortness of breath for several days, initial encounter EXAM: CHEST - 2 VIEW COMPARISON:  07/19/2019 FINDINGS: Cardiac shadow is stable. Aortic calcifications are noted. Lungs are well aerated bilaterally. Minimal left basilar atelectasis is seen. Small left pleural effusion is noted. No bony abnormality is noted. Postsurgical changes of the left arm are noted. IMPRESSION: Mild left basilar atelectasis and effusion. Electronically Signed   By: Inez Catalina M.D.   On: 07/06/2021 22:21   PERIPHERAL VASCULAR CATHETERIZATION  Result Date: 06/20/2021 See surgical note for result.  DG Chest Portable 1 View  Result Date: July 09, 2021 CLINICAL DATA:  Worsening shortness of breath. EXAM: PORTABLE CHEST 1 VIEW  COMPARISON:  None. FINDINGS: Mild, diffusely increased interstitial lung markings are seen. This is very mildly increased in severity when compared to the prior study. Mild prominence of the perihilar pulmonary vasculature is also noted. There is stable elevation of the right hemidiaphragm, without evidence of focal consolidation, pleural effusion or pneumothorax. The heart size and mediastinal contours are within normal limits. Marked severity calcification of the thoracic aorta is seen. A radiopaque vascular stent is seen within the medial aspect of the left upper extremity. There is an intact left shoulder replacement. IMPRESSION: Mild, diffusely increased interstitial lung markings, with a mild superimposed component of interstitial edema and pulmonary vascular congestion. Electronically Signed   By: Virgina Norfolk M.D.   On: 2021-07-09 00:26   VAS US DUPLEX DIALYSIS ACCESS (AVF, AVG)  Result Date: 06/11/2021 DIALYSIS ACCESS Patient Name:  Dylan Sanders  Date of Exam:   06/06/2021 Medical Rec #: [id number]        Accession #:    [id number] Date of Birth: 09-Aug-1931         Patient Gender: M Patient Age:   59 years Exam Location:  [facility] Procedure:      VAS US DUPLEX DIALYSIS ACCESS (AVF, AVG) Referring Phys: Eulogio Ditch --------------------------------------------------------------------------------  Reason for Exam: Clotting during dialysis. Access Site: Left Upper Extremity. Access Type: BrachAx. History: 01/05/19: Left Brach-Ax AVG placement;          07/27/19: PTA/stent of AVG venous outflow;. Comparison Study: 08/30/2020 Performing Technologist: Concha Norway RVT  Examination Guidelines: A complete evaluation includes B-mode imaging, spectral Doppler, color Doppler, and power Doppler as needed of all accessible portions of each vessel. Unilateral testing is considered an integral part of a complete examination. Limited examinations for reoccurring indications may be performed as noted.   Findings:   +--------------------+----------+-----------------+----------------------------+  AVG                  PSV (cm/s) Flow Vol (mL/min)           Describe            +--------------------+----------+-----------------+----------------------------+  Native artery inflow    232           1443                                      +--------------------+----------+-----------------+----------------------------+  Arterial anastomosis    767                       change in diameter, narrows                                                         to .13cm and stenotic      +--------------------+----------+-----------------+----------------------------+  Prox graft              688                            change in diameter       +--------------------+----------+-----------------+----------------------------+  Mid graft               533                                                     +--------------------+----------+-----------------+----------------------------+  Distal graft            342                                                     +--------------------+----------+-----------------+----------------------------+  Venous anastomosis      233                                                     +--------------------+----------+-----------------+----------------------------+  Venous outflow          126                                  stent              +--------------------+----------+-----------------+----------------------------+ +---------------+------------+----------+---------+--------+------------------+                    Diameter   Depth (cm) Branching   PSV       Flow Volume                           (cm)                           (cm/s)       (ml/min)       +---------------+------------+----------+---------+--------+------------------+  Left Rad Art                                         48                         Dis                                                                             +---------------+------------+----------+---------+--------+------------------+  Antegrade                                                                      +---------------+------------+----------+---------+--------+------------------+  Summary: Patent left BrachAx AVG with severe narrowing of the anastomosis site and proximal AVG to .13cm diameter with associated high velocities suggestive of severe stenosis.  *See table(s) above for measurements and observations.  Diagnosing physician: Hortencia Pilar MD Electronically signed by Hortencia Pilar MD on 06/11/2021 at 1:03:08 PM.   --------------------------------------------------------------------------------   Final     EKG: Sinus rhythm with diffuse ST changes.   ASSESSMENT AND PLAN:  Dylan Sanders is an 88 year old male with a history of moderate to severe aortic stenosis, end-stage renal disease, type 2 diabetes, renal artery stenosis, hyperlipidemia who presents with shortness of breath and chest tightness.  He was discovered to have an elevated troponin of 7000 and hypotension consistent with cardiogenic shock.  I was called for consideration of taking to the patient to the Cath Lab emergently overnight.  # NSTEMI # Moderate to severe aortic stenosis # Cardiogenic shock # Acute respiratory failure with hypoxia Presenting with chest tightness that has been on and off for several days and now persistent since mid afternoon on 06/14/2021.  His EKG shows diffuse ST depressions, which have improved now that he is on BiPAP.  His troponin is elevated at 7000 on presentation, and is increased to approximately 9000 over 4-hour period.  He is notably hypotensive, and his respiratory status has declined over the last few hours requiring BiPAP.  He does not complain of frank chest pain, but does have ongoing chest tightness, which is likely consistent with ACS versus global demand ischemia.  He may also have an alternative etiology of his shock driving his  presentation including sepsis, though this is not totally convincing given his mildly elevated procalcitonin and lack of infectious source.  I do believe he needs a heart catheterization urgently, but his respiratory status needs to become more stable. At this juncture we will focus on stabilization, with a tentative plan for heart catheterization in the morning.  Bedside echocardiogram was completed and showed global decreased ejection fraction of approximately 35 to 40%, which is decreased from prior. - Continue aspirin and heparin infusion -Continue BiPAP for respiratory support -Continue norepinephrine for blood pressure.  Agree with placement of central line.  Would recommend transducing a CVP. -If patient makes urine, consider giving Lasix to help with his respiratory status. -Ultimately may require intubation prior to heart catheterization, though he appears stable currently on BiPAP.  Signed: Andrez Grime MD 13-Jul-2021, 2:38 AM

## 2021-07-11 NOTE — Interval H&P Note (Signed)
History and Physical Interval Note:  2021-07-31 1:13 PM  [place]  has presented today for surgery, with the diagnosis of nstemi - Acute combined HF & Severe AS.  The various methods of treatment have been discussed with the patient and family. After consideration of risks, benefits and other options for treatment, the patient has consented to  Procedure(s): LEFT HEART CATH AND CORONARY ANGIOGRAPHY (N/A)  PERCUTANEOUS CORONARY INTERVENTION   as a surgical intervention.  The patient's history has been reviewed, patient examined, no change in status, stable for surgery.  I have reviewed the patient's chart and labs.  Questions were answered to the patient's satisfaction.    Cath Lab Visit (complete for each Cath Lab visit)  Clinical Evaluation Leading to the Procedure:   ACS: Yes.    Non-ACS:    Anginal Classification: CCS IV  Anti-ischemic medical therapy: Minimal Therapy (1 class of medications)  Non-Invasive Test Results: No non-invasive testing performed - ECHO with severe AS & Severely Reduced LVEF  Prior CABG: No previous CABG   Glenetta Hew

## 2021-07-11 NOTE — H&P (View-Only) (Signed)
Cardiology Progress Note   Patient Name: Dylan Sanders Date of Encounter: 2021/07/14  Primary Cardiologist: Ida Rogue, MD  Subjective   Remains on bipap and norepi.  Currently feels well w/o chest burning or dyspnea.  Son @ bedside.  Pt agreeable to cath today.  Inpatient Medications    Scheduled Meds:  aspirin EC  81 mg Oral Daily   atorvastatin  80 mg Oral Daily   Chlorhexidine Gluconate Cloth  6 each Topical Daily   Chlorhexidine Gluconate Cloth  6 each Topical Q0600   furosemide  20 mg Intravenous BID   sodium chloride flush  3 mL Intravenous Q12H   Continuous Infusions:  sodium chloride Stopped (Jul 14, 2021 0349)   heparin 800 Units/hr (07/14/2021 0800)   norepinephrine (LEVOPHED) Adult infusion 14 mcg/min (Jul 14, 2021 0800)   PRN Meds: docusate sodium, polyethylene glycol   Vital Signs    Vitals:   2021-07-14 0815 2021-07-14 0830 07-14-21 0833 July 14, 2021 0845  BP: (!) 122/58 (!) 113/49  (!) 109/37  Pulse:  80    Resp: 16 16  (!) 22  Temp:      TempSrc:      SpO2:  94% 95%   Weight:      Height:        Intake/Output Summary (Last 24 hours) at July 14, 2021 0855 Last data filed at 07-14-21 0800 Gross per 24 hour  Intake 1315.13 ml  Output --  Net 1315.13 ml   Filed Weights   07/01/2021 2202 07-14-21 0349  Weight: 61.7 kg 62.8 kg    Physical Exam   GEN: thin, frail, in no acute distress.  HEENT: Grossly normal.  Neck: Supple, no JVD, carotid bruits, or masses. Cardiac: RRR, 2/6 SEM throughout, no rubs or gallops. No clubbing, cyanosis, edema.  Radials 2+, DP/PT 1+ bilaterally.  Respiratory:  Respirations regular and unlabored, coarse breath sounds bilaterally - on bipap. GI: Soft, nontender, nondistended, BS + x 4. MS: no deformity or atrophy. Skin: warm and dry, no rash. Neuro:  Strength and sensation are intact. Psych: AAOx3.  Normal affect.  Labs    Chemistry Recent Labs  Lab 06/19/2021 2208 2021/07/14 0427  NA 136 136  K 4.8 4.6  CL 92* 97*  CO2  27 23  GLUCOSE 194* 199*  BUN 50* 53*  CREATININE 6.82* 6.95*  CALCIUM 9.3 8.9  GFRNONAA 7* 7*  ANIONGAP 17* 16*     Hematology Recent Labs  Lab 07/08/2021 2208 07-14-21 0427  WBC 16.2* 19.6*  RBC 2.70* 2.56*  HGB 9.0* 8.7*  HCT 27.9* 26.2*  MCV 103.3* 102.3*  MCH 33.3 34.0  MCHC 32.3 33.2  RDW 15.2 15.3  PLT 151 179    Cardiac Enzymes  Recent Labs  Lab 07/03/2021 2208 06/21/2021 2352 07-14-21 0101 2021/07/14 0427  TROPONINIHS 7,002* 7,931* 8,877* 10,855*      BNP Recent Labs  Lab July 14, 2021 0003  BNP 2,968.3*     DDimer  Recent Labs  Lab 07/14/2021 0101  DDIMER 1.25*    Radiology    DG Chest 1 View  Result Date: 2021/07/14 CLINICAL DATA:  Central line placement EXAM: CHEST  1 VIEW COMPARISON:  2021/07/14 12:23 a.m. FINDINGS: Interval placement of a right IJ CVC with tip approximating the superior cavoatrial junction. Redemonstrated increased prominence of the perihilar pulmonary vasculature and diffusely increased interstitial markings. Elevation of the right hemidiaphragm. Normal cardiac and mediastinal contours. Aortic atherosclerosis. Left upper arm vascular stent. Status post left shoulder arthroplasty. No definite pleural effusion or pneumothorax.  IMPRESSION: 1. Right IJ CVC with tip approximating the superior cavoatrial junction. 2. Redemonstrated findings concerning for pulmonary edema. Electronically Signed   By: Merilyn Baba M.D.   On: 2021/07/25 03:27   DG Chest 2 View  Result Date: 06/29/2021 CLINICAL DATA:  Shortness of breath for several days, initial encounter EXAM: CHEST - 2 VIEW COMPARISON:  07/19/2019 FINDINGS: Cardiac shadow is stable. Aortic calcifications are noted. Lungs are well aerated bilaterally. Minimal left basilar atelectasis is seen. Small left pleural effusion is noted. No bony abnormality is noted. Postsurgical changes of the left arm are noted. IMPRESSION: Mild left basilar atelectasis and effusion. Electronically Signed   By: Inez Catalina  M.D.   On: 07/07/2021 22:21   DG Chest Portable 1 View  Result Date: 2021-07-25 CLINICAL DATA:  Worsening shortness of breath. EXAM: PORTABLE CHEST 1 VIEW COMPARISON:  None. FINDINGS: Mild, diffusely increased interstitial lung markings are seen. This is very mildly increased in severity when compared to the prior study. Mild prominence of the perihilar pulmonary vasculature is also noted. There is stable elevation of the right hemidiaphragm, without evidence of focal consolidation, pleural effusion or pneumothorax. The heart size and mediastinal contours are within normal limits. Marked severity calcification of the thoracic aorta is seen. A radiopaque vascular stent is seen within the medial aspect of the left upper extremity. There is an intact left shoulder replacement. IMPRESSION: Mild, diffusely increased interstitial lung markings, with a mild superimposed component of interstitial edema and pulmonary vascular congestion. Electronically Signed   By: Virgina Norfolk M.D.   On: 07-25-2021 00:26    Telemetry    RSR - Personally Reviewed  ECG    RSR, 81, inferolateral ST depression, septal infarct - Personally Reviewed  Cardiac Studies   2D Echocardiogram 12.2022   1. Left ventricular ejection fraction, by estimation, is 55 to 60%. The  left ventricle has normal function. The left ventricle has no regional  wall motion abnormalities. There is mild left ventricular hypertrophy.  Left ventricular diastolic parameters  are consistent with Grade II diastolic dysfunction (pseudonormalization).   2. Right ventricular systolic function is normal. The right ventricular  size is normal.   3. The mitral valve is degenerative. No evidence of mitral valve  regurgitation. Mild mitral stenosis. The mean mitral valve gradient is 3.2  mmHg. Moderate mitral annular calcification.   4. Aortic valve mean gradient 52mmHg, peak gradient 70mmHg, DVI=0.25,  Vmax 3.29m/s, AVA 0.88cm2. The aortic valve is  tricuspid. Aortic valve  regurgitation is mild. Moderate to severe aortic valve stenosis.  _____________   Patient Profile     88 y.o. male w/ a h/o HTN, DMII, ESRD on HD, mod-sev AS, RAS, HL, colon cancer, MGUS, pulm nodules, and Ao atherosclerosis, who presented late 07/03/2021 w/ a 4 day h/o progressive rest and exertional chest burning, and was found to have new septal Q waves, inflat ST depression, and NSTEMI w/ HsTrop 10855, and hypotension req norepi.  Assessment & Plan    1.  NSTEMI:  Pt w/ a h/o cor Ca2+ and PVD/RAS, but w/o known h/o obstructive CAD.  He's been having rest and exertional sscp/burning w/ dyspnea x 5 days.  Presented to the ED late on 2/22 and noted to have new septal infarct w/ inflat ST depression.  Hypotension also noted req norepi.  HsTrop has risen to 10855.  Currently c/p free on heparin/bipap.  Received ASA in ED.  Will add ASA 81 daily and high potency statin.  Plan for  diagnostic cath today, to be followed by HD.  The patient understands that risks include but are not limited to stroke (1 in 1000), death (1 in 50), kidney failure [usually temporary] (1 in 500), bleeding (1 in 200), allergic reaction [possibly serious] (1 in 200), and agrees to proceed.  Unable to add ? blocker in setting of shock/hypotension.  2.  Moderate-Severe Aortic Stenosis:  Progressive over time and mod-sev AS by echo in 04/2021.  Repeat echo pending in the setting of above.  3.  ESRD:  Nephrology following.  Plan for HD post-cath today.  4.  Hypotension/shock:  Norepi pre CCM.  5.  Acute resp failure:  currently saturating in the low 90's on bipap.  6.  Macrocytic anemia:  H/H lower compared to October readings.  Followed in outpt setting by heme.  Follow in setting of anticoagulation w/ low threshold to transfuse in setting of ischemia/NSTEMI.  Signed, Murray Hodgkins, NP  07-22-21, 8:55 AM    For questions or updates, please contact   Please consult [email or web address] for contact  info under Cardiology/STEMI.

## 2021-07-11 NOTE — ED Notes (Addendum)
Respiratory and intensalist at bedside

## 2021-07-11 NOTE — Progress Notes (Signed)
Transported pt to cath lab for cath on bipap.

## 2021-07-11 NOTE — Progress Notes (Signed)
Responded to consult generated by pressor being initiated. NP at bedside, stated she will be placing a central line.

## 2021-07-11 NOTE — ED Notes (Signed)
Per verbal order per Dr. Leonides Schanz, pt is to be started on levophed at 10 mcg/min

## 2021-07-11 NOTE — ED Notes (Addendum)
Pt placed on bipap by respiratory  

## 2021-07-11 DEATH — deceased

## 2021-07-12 LAB — CULTURE, BLOOD (ROUTINE X 2)
Culture: NO GROWTH
Culture: NO GROWTH
Special Requests: ADEQUATE

## 2021-07-30 ENCOUNTER — Encounter (INDEPENDENT_AMBULATORY_CARE_PROVIDER_SITE_OTHER): Payer: PPO

## 2021-07-30 ENCOUNTER — Ambulatory Visit (INDEPENDENT_AMBULATORY_CARE_PROVIDER_SITE_OTHER): Payer: PPO | Admitting: Nurse Practitioner

## 2021-08-29 ENCOUNTER — Ambulatory Visit (INDEPENDENT_AMBULATORY_CARE_PROVIDER_SITE_OTHER): Payer: PPO | Admitting: Nurse Practitioner

## 2021-08-29 ENCOUNTER — Encounter (INDEPENDENT_AMBULATORY_CARE_PROVIDER_SITE_OTHER): Payer: PPO

## 2021-10-16 IMAGING — CR DG CHEST 2V
1 series · 2 of 2 positions shown · non-contrast
Comparison: June 19, 2018

CLINICAL DATA: TB screening.  No complaints.

EXAM:
CHEST - 2 VIEW

[Series 1: dg chest 2 view · 0.14mm/px · 2 of 2 slices shown]
[im 1/2]
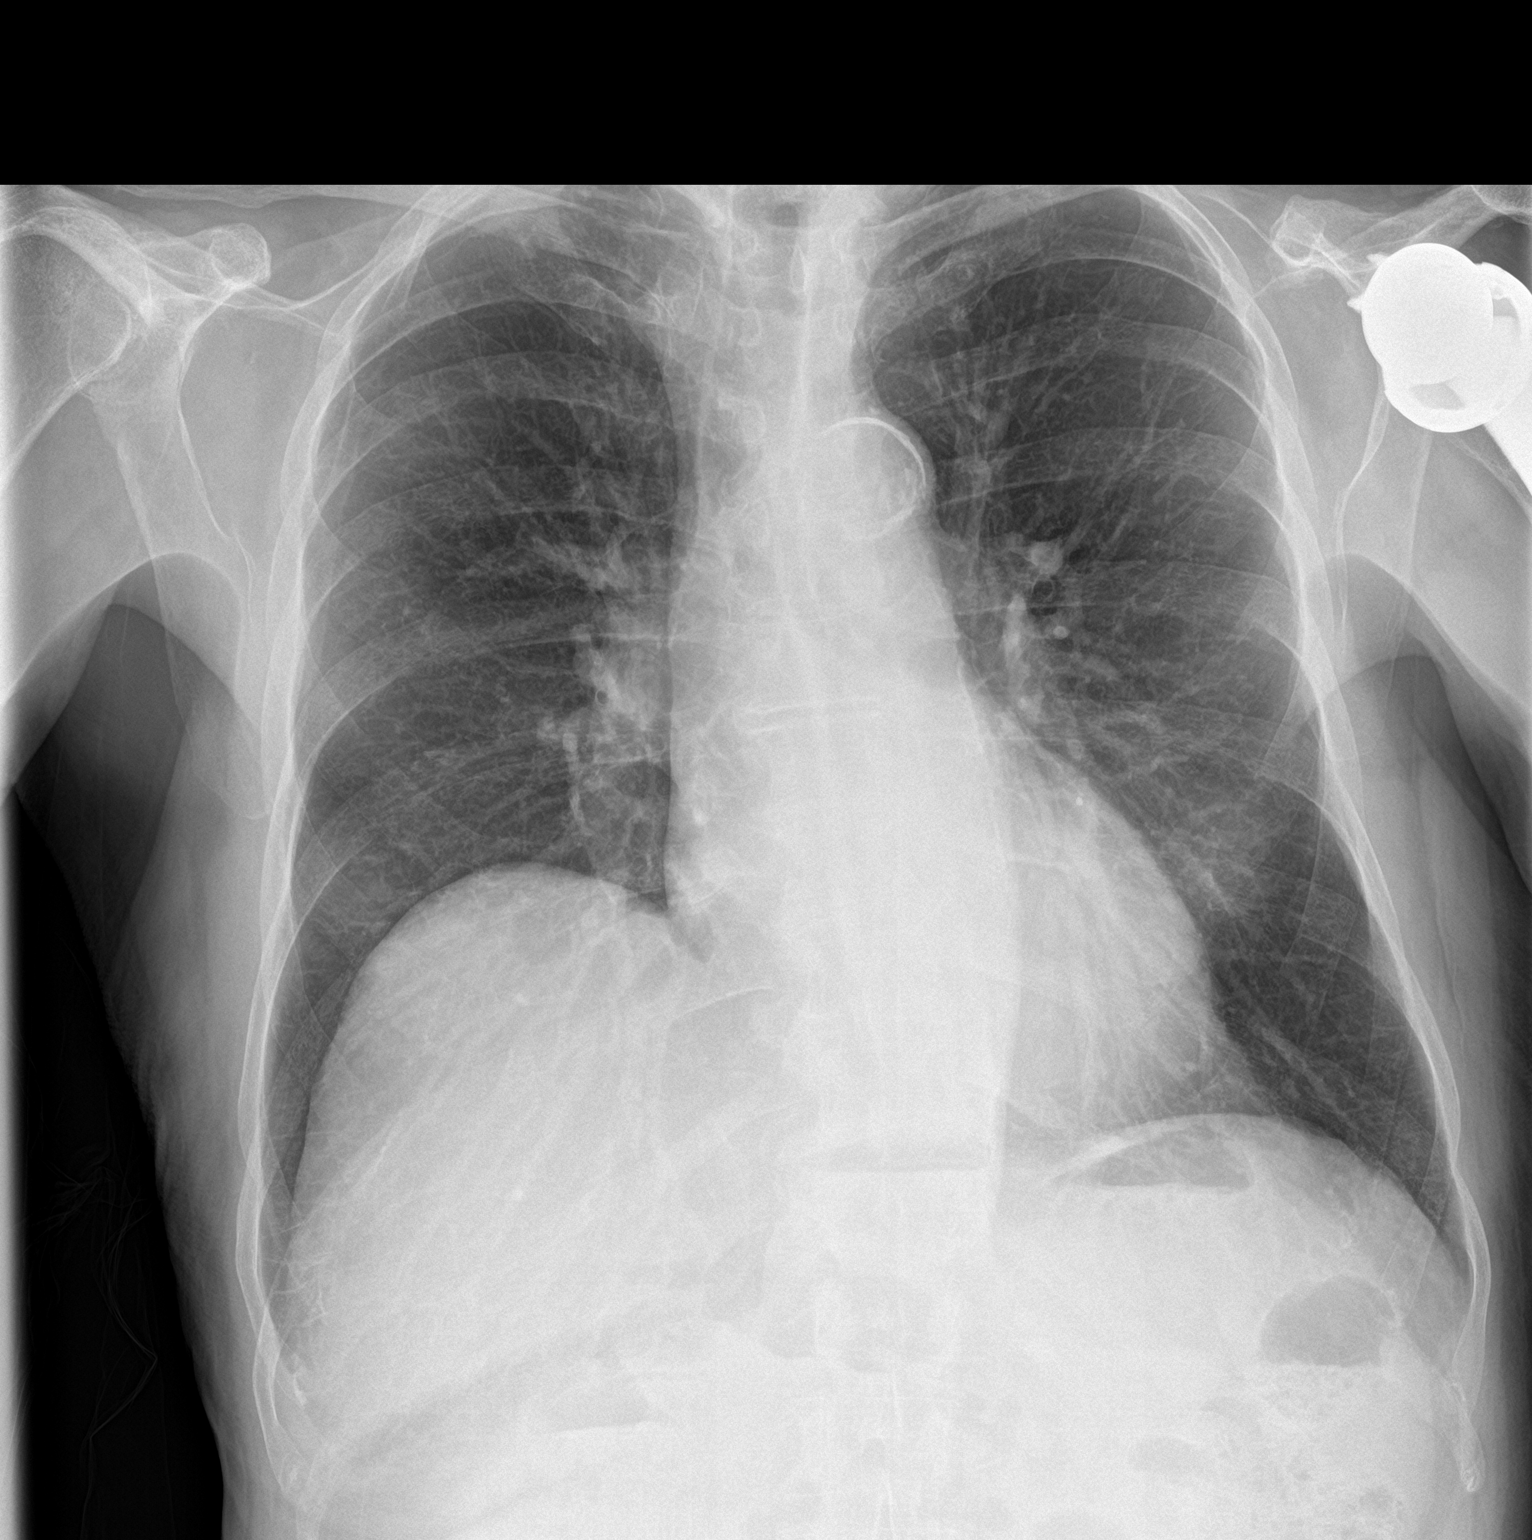
[im 2/2]
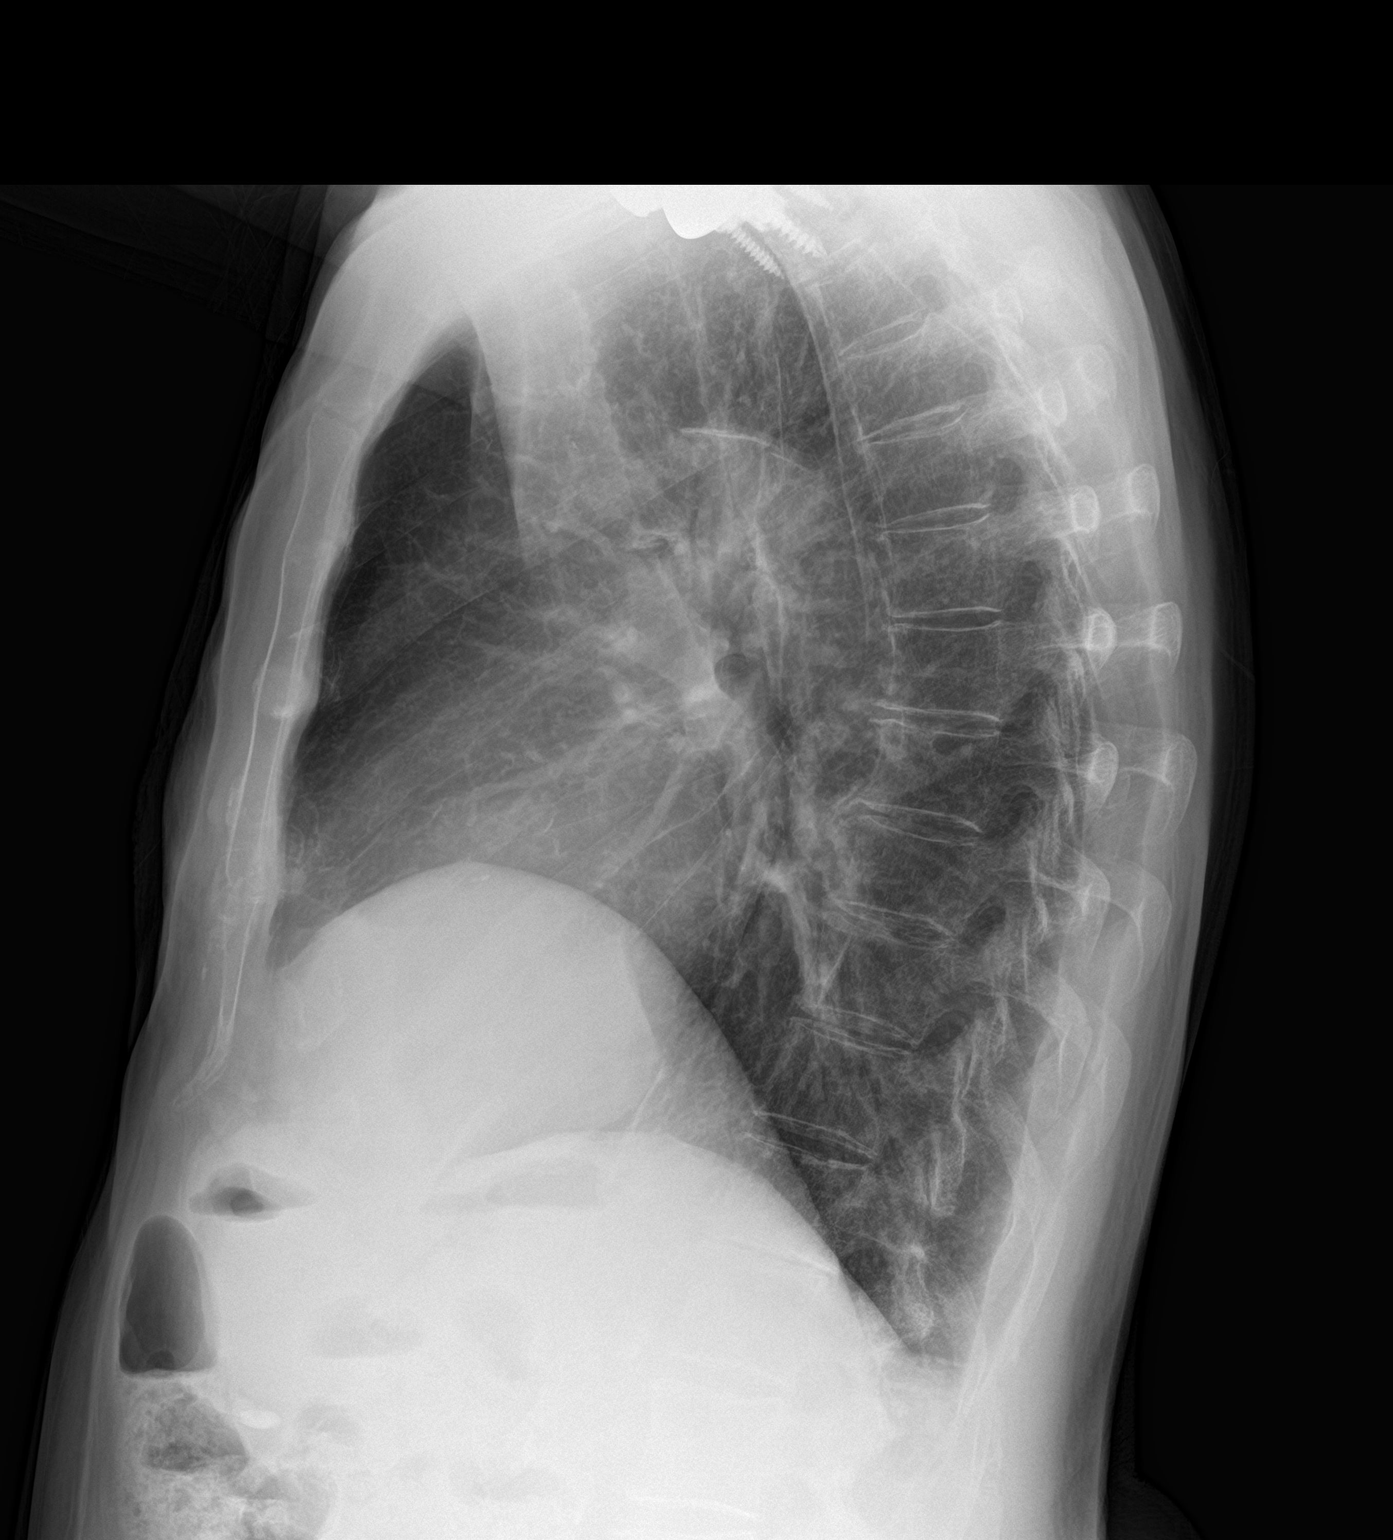

[2 of 2 positions shown; findings below may reference images not displayed]

FINDINGS: There is elevation the right hemidiaphragm. Heart, hila, and
mediastinum are normal. No pneumothorax. No nodules or masses. No
focal infiltrates. No evidence of active TB identified.
IMPRESSION: No active cardiopulmonary disease.

## 2022-03-08 ENCOUNTER — Other Ambulatory Visit: Payer: PPO

## 2022-03-15 ENCOUNTER — Ambulatory Visit: Payer: PPO | Admitting: Oncology
